# Patient Record
Sex: Female | Born: 1945 | Race: White | Hispanic: No | Smoking: Never smoker
Health system: Southern US, Community
[De-identification: ages and names within clinical notes are randomized; demographics above are authoritative.]

## PROBLEM LIST (undated history)

## (undated) DIAGNOSIS — M545 Low back pain, unspecified: Secondary | ICD-10-CM

## (undated) DIAGNOSIS — E7800 Pure hypercholesterolemia, unspecified: Secondary | ICD-10-CM

## (undated) DIAGNOSIS — M549 Dorsalgia, unspecified: Secondary | ICD-10-CM

## (undated) DIAGNOSIS — Z79891 Long term (current) use of opiate analgesic: Secondary | ICD-10-CM

## (undated) DIAGNOSIS — G8929 Other chronic pain: Secondary | ICD-10-CM

## (undated) DIAGNOSIS — K2210 Ulcer of esophagus without bleeding: Secondary | ICD-10-CM

## (undated) DIAGNOSIS — K5790 Diverticulosis of intestine, part unspecified, without perforation or abscess without bleeding: Secondary | ICD-10-CM

## (undated) DIAGNOSIS — M797 Fibromyalgia: Secondary | ICD-10-CM

## (undated) DIAGNOSIS — M1990 Unspecified osteoarthritis, unspecified site: Secondary | ICD-10-CM

## (undated) DIAGNOSIS — Z8719 Personal history of other diseases of the digestive system: Secondary | ICD-10-CM

## (undated) DIAGNOSIS — K219 Gastro-esophageal reflux disease without esophagitis: Secondary | ICD-10-CM

## (undated) DIAGNOSIS — R55 Syncope and collapse: Secondary | ICD-10-CM

## (undated) DIAGNOSIS — G5600 Carpal tunnel syndrome, unspecified upper limb: Secondary | ICD-10-CM

## (undated) DIAGNOSIS — Z8711 Personal history of peptic ulcer disease: Secondary | ICD-10-CM

## (undated) DIAGNOSIS — F32A Depression, unspecified: Secondary | ICD-10-CM

## (undated) DIAGNOSIS — F329 Major depressive disorder, single episode, unspecified: Secondary | ICD-10-CM

## (undated) DIAGNOSIS — K589 Irritable bowel syndrome without diarrhea: Secondary | ICD-10-CM

## (undated) DIAGNOSIS — I10 Essential (primary) hypertension: Secondary | ICD-10-CM

## (undated) DIAGNOSIS — R296 Repeated falls: Secondary | ICD-10-CM

## (undated) DIAGNOSIS — G629 Polyneuropathy, unspecified: Secondary | ICD-10-CM

## (undated) DIAGNOSIS — D649 Anemia, unspecified: Secondary | ICD-10-CM

## (undated) HISTORY — PX: LAPAROSCOPIC CHOLECYSTECTOMY: SUR755

## (undated) HISTORY — PX: BUNIONECTOMY: SHX129

## (undated) HISTORY — PX: BACK SURGERY: SHX140

## (undated) HISTORY — PX: SPINAL CORD STIMULATOR IMPLANT: SHX2422

## (undated) HISTORY — PX: JOINT REPLACEMENT: SHX530

## (undated) HISTORY — PX: BLADDER SUSPENSION: SHX72

---
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
## 1969-05-21 HISTORY — PX: ABDOMINAL HYSTERECTOMY: SHX81

## 1999-05-23 ENCOUNTER — Encounter: Payer: Self-pay | Admitting: Emergency Medicine

## 1999-05-23 ENCOUNTER — Emergency Department (HOSPITAL_COMMUNITY): Admission: EM | Admit: 1999-05-23 | Discharge: 1999-05-23 | Payer: Self-pay | Admitting: Emergency Medicine

## 2004-07-21 ENCOUNTER — Ambulatory Visit: Payer: Self-pay | Admitting: Family Medicine

## 2005-09-20 HISTORY — PX: LUMBAR FUSION: SHX111

## 2005-10-19 ENCOUNTER — Ambulatory Visit: Payer: Self-pay | Admitting: Family Medicine

## 2005-11-04 ENCOUNTER — Inpatient Hospital Stay (HOSPITAL_COMMUNITY): Admission: AD | Admit: 2005-11-04 | Discharge: 2005-11-08 | Payer: Self-pay | Admitting: Neurosurgery

## 2006-04-29 ENCOUNTER — Inpatient Hospital Stay: Payer: Self-pay | Admitting: Internal Medicine

## 2006-04-29 ENCOUNTER — Other Ambulatory Visit: Payer: Self-pay

## 2006-11-24 ENCOUNTER — Ambulatory Visit: Payer: Self-pay | Admitting: Gastroenterology

## 2006-11-28 IMAGING — CR DG CHEST 2V
2 series · 2 of 2 positions shown · non-contrast
Comparison: None.

CLINICAL DATA: Pre-op back surgery.
 CHEST - 2 VIEW:

[view not recorded (1 of 2)]
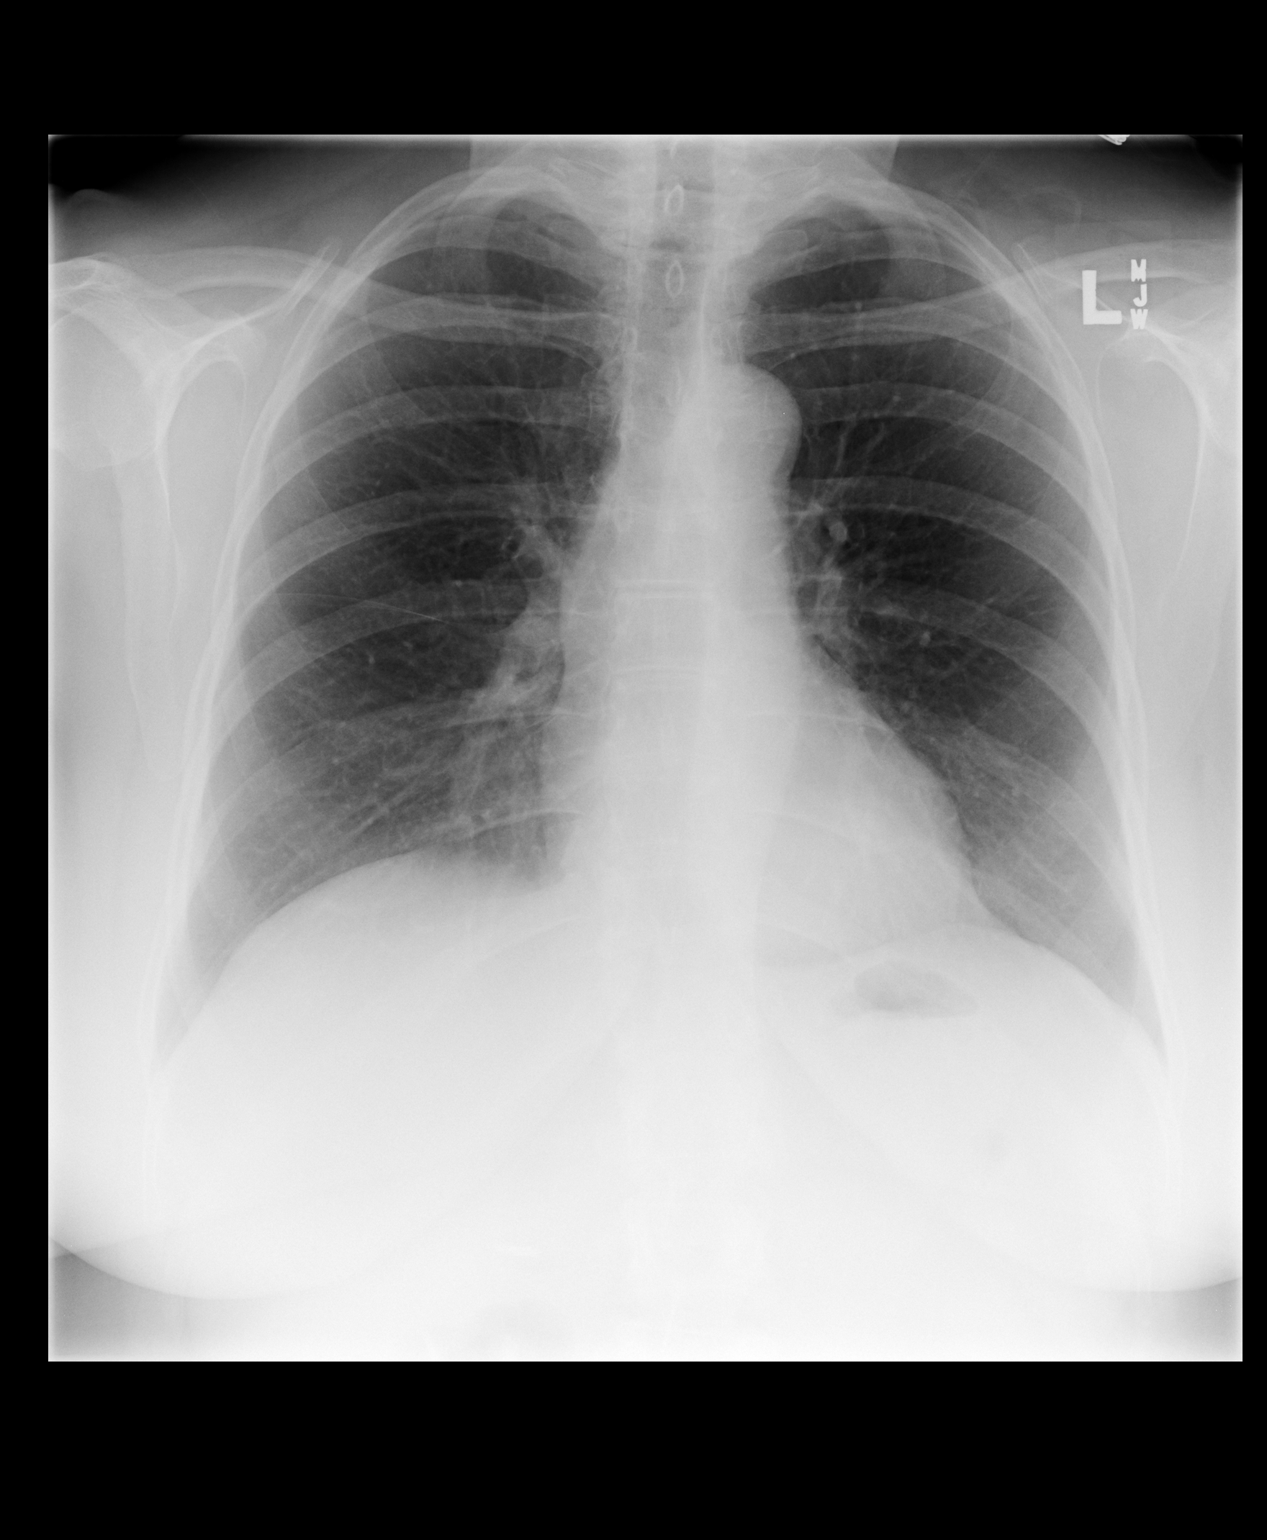

[view not recorded (2 of 2)]
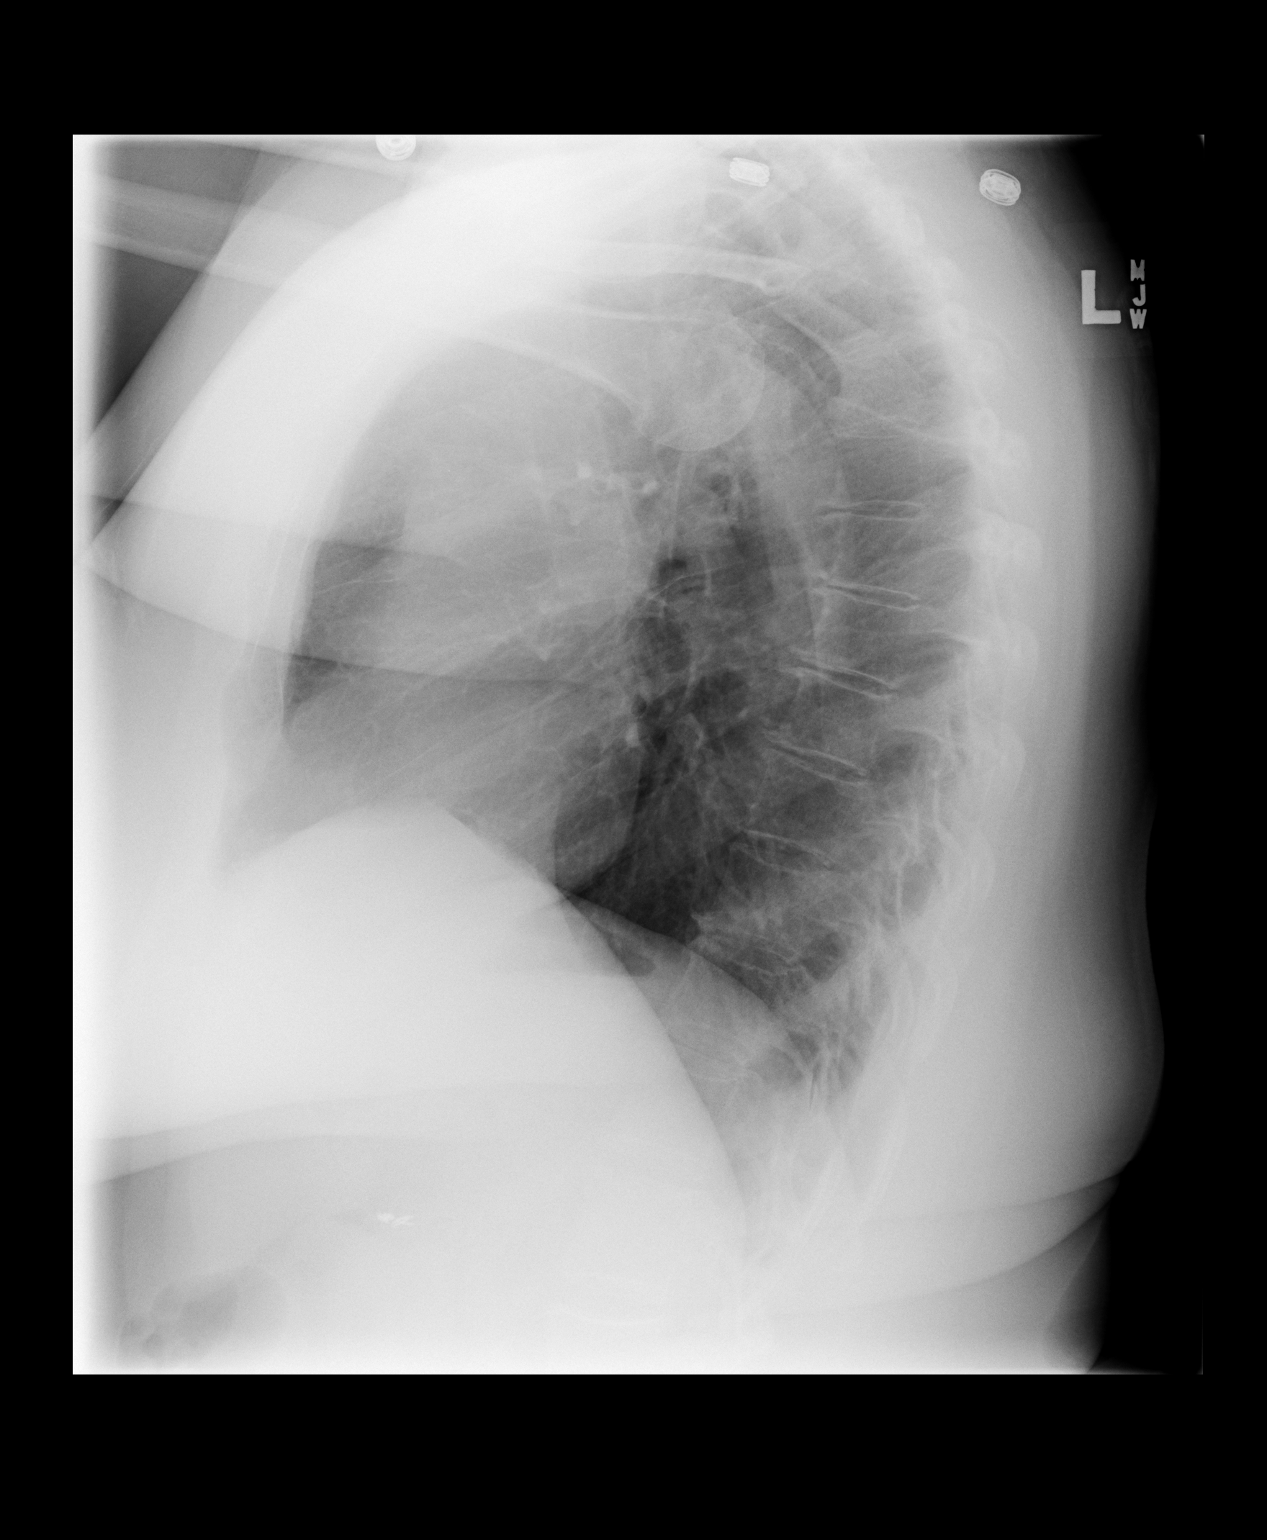

[2 of 2 positions shown; findings below may reference images not displayed]

FINDINGS: The heart size and mediastinal contours are within normal limits.  Both lungs are clear.  The visualized skeletal structures are unremarkable.
IMPRESSION: No active cardiopulmonary disease.

## 2007-06-26 ENCOUNTER — Other Ambulatory Visit: Payer: Self-pay

## 2007-06-26 ENCOUNTER — Ambulatory Visit: Payer: Self-pay | Admitting: Urology

## 2008-01-25 ENCOUNTER — Ambulatory Visit: Payer: Self-pay | Admitting: Physician Assistant

## 2009-02-12 ENCOUNTER — Ambulatory Visit: Payer: Self-pay | Admitting: Physician Assistant

## 2009-02-17 ENCOUNTER — Ambulatory Visit: Payer: Self-pay | Admitting: Physician Assistant

## 2010-05-18 ENCOUNTER — Ambulatory Visit: Payer: Self-pay | Admitting: Specialist

## 2010-05-28 ENCOUNTER — Inpatient Hospital Stay: Payer: Self-pay | Admitting: Specialist

## 2010-09-20 HISTORY — PX: TOTAL KNEE ARTHROPLASTY: SHX125

## 2013-02-08 ENCOUNTER — Inpatient Hospital Stay (HOSPITAL_COMMUNITY)
Admission: EM | Admit: 2013-02-08 | Discharge: 2013-02-10 | DRG: 392 | Disposition: A | Discharge status: Home / Self Care | Payer: Medicare Other | Attending: Internal Medicine | Admitting: Internal Medicine

## 2013-02-08 ENCOUNTER — Encounter (HOSPITAL_COMMUNITY): Payer: Self-pay | Admitting: Internal Medicine

## 2013-02-08 DIAGNOSIS — Z96659 Presence of unspecified artificial knee joint: Secondary | ICD-10-CM

## 2013-02-08 DIAGNOSIS — K5289 Other specified noninfective gastroenteritis and colitis: Principal | ICD-10-CM | POA: Diagnosis present

## 2013-02-08 DIAGNOSIS — R197 Diarrhea, unspecified: Secondary | ICD-10-CM | POA: Diagnosis present

## 2013-02-08 DIAGNOSIS — Z7982 Long term (current) use of aspirin: Secondary | ICD-10-CM

## 2013-02-08 DIAGNOSIS — I498 Other specified cardiac arrhythmias: Secondary | ICD-10-CM | POA: Diagnosis present

## 2013-02-08 DIAGNOSIS — R112 Nausea with vomiting, unspecified: Secondary | ICD-10-CM | POA: Diagnosis present

## 2013-02-08 DIAGNOSIS — Z79899 Other long term (current) drug therapy: Secondary | ICD-10-CM

## 2013-02-08 DIAGNOSIS — I10 Essential (primary) hypertension: Secondary | ICD-10-CM | POA: Diagnosis present

## 2013-02-08 DIAGNOSIS — F329 Major depressive disorder, single episode, unspecified: Secondary | ICD-10-CM | POA: Diagnosis present

## 2013-02-08 DIAGNOSIS — F3289 Other specified depressive episodes: Secondary | ICD-10-CM | POA: Diagnosis present

## 2013-02-08 DIAGNOSIS — E876 Hypokalemia: Secondary | ICD-10-CM | POA: Diagnosis present

## 2013-02-08 HISTORY — DX: Other chronic pain: G89.29

## 2013-02-08 HISTORY — DX: Essential (primary) hypertension: I10

## 2013-02-08 HISTORY — DX: Depression, unspecified: F32.A

## 2013-02-08 HISTORY — DX: Dorsalgia, unspecified: M54.9

## 2013-02-08 HISTORY — DX: Major depressive disorder, single episode, unspecified: F32.9

## 2013-02-08 LAB — CBC WITH DIFFERENTIAL/PLATELET
Basophils Absolute: 0 10*3/uL (ref 0.0–0.1)
Basophils Relative: 0 % (ref 0–1)
Hemoglobin: 11.3 g/dL — ABNORMAL LOW (ref 12.0–15.0)
MCHC: 33.5 g/dL (ref 30.0–36.0)
Neutro Abs: 8.2 10*3/uL — ABNORMAL HIGH (ref 1.7–7.7)
Neutrophils Relative %: 86 % — ABNORMAL HIGH (ref 43–77)
Platelets: 231 10*3/uL (ref 150–400)
RDW: 12.5 % (ref 11.5–15.5)

## 2013-02-08 LAB — URINALYSIS, ROUTINE W REFLEX MICROSCOPIC
Bilirubin Urine: NEGATIVE
Glucose, UA: NEGATIVE mg/dL
Ketones, ur: 15 mg/dL — AB
Specific Gravity, Urine: 1.015 (ref 1.005–1.030)
pH: 6.5 (ref 5.0–8.0)

## 2013-02-08 LAB — COMPREHENSIVE METABOLIC PANEL
AST: 28 U/L (ref 0–37)
Albumin: 3.8 g/dL (ref 3.5–5.2)
Alkaline Phosphatase: 81 U/L (ref 39–117)
Chloride: 99 mEq/L (ref 96–112)
Potassium: 2.4 mEq/L — CL (ref 3.5–5.1)
Sodium: 141 mEq/L (ref 135–145)
Total Bilirubin: 0.4 mg/dL (ref 0.3–1.2)

## 2013-02-08 LAB — URINE MICROSCOPIC-ADD ON

## 2013-02-08 MED ORDER — POTASSIUM CHLORIDE 10 MEQ/100ML IV SOLN
10.0000 meq | INTRAVENOUS | Status: AC
  Administered 2013-02-08 – 2013-02-09 (×3): 10 meq via INTRAVENOUS
  Filled 2013-02-08 (×2): qty 100

## 2013-02-08 MED ORDER — ONDANSETRON HCL 4 MG/2ML IJ SOLN
4.0000 mg | Freq: Once | INTRAMUSCULAR | Status: AC
Start: 2013-02-08 — End: 2013-02-08
  Administered 2013-02-08: 4 mg via INTRAVENOUS
  Filled 2013-02-08: qty 2

## 2013-02-08 MED ORDER — ONDANSETRON HCL 4 MG/2ML IJ SOLN
4.0000 mg | Freq: Once | INTRAMUSCULAR | Status: AC
  Administered 2013-02-08: 4 mg via INTRAVENOUS
  Filled 2013-02-08: qty 2

## 2013-02-08 MED ORDER — SODIUM CHLORIDE 0.9 % IV SOLN
Freq: Once | INTRAVENOUS | Status: AC
  Administered 2013-02-08: 21:00:00 via INTRAVENOUS

## 2013-02-08 MED ORDER — SODIUM CHLORIDE 0.9 % IV BOLUS (SEPSIS)
1000.0000 mL | Freq: Once | INTRAVENOUS | Status: AC
  Administered 2013-02-08: 1000 mL via INTRAVENOUS

## 2013-02-08 MED ORDER — SODIUM CHLORIDE 0.9 % IV SOLN: INTRAVENOUS | Status: DC

## 2013-02-08 MED ORDER — ONDANSETRON HCL 4 MG/2ML IJ SOLN: 4.0000 mg | Freq: Three times a day (TID) | INTRAMUSCULAR | Status: DC | PRN

## 2013-02-08 MED ORDER — HYDROMORPHONE HCL PF 1 MG/ML IJ SOLN: 1.0000 mg | INTRAMUSCULAR | Status: DC | PRN

## 2013-02-08 MED ORDER — POTASSIUM CHLORIDE CRYS ER 20 MEQ PO TBCR
40.0000 meq | EXTENDED_RELEASE_TABLET | Freq: Once | ORAL | Status: AC
  Administered 2013-02-08: 40 meq via ORAL
  Filled 2013-02-08: qty 2

## 2013-02-08 MED ORDER — POTASSIUM CHLORIDE 10 MEQ/100ML IV SOLN
10.0000 meq | Freq: Once | INTRAVENOUS | Status: AC
  Administered 2013-02-08: 10 meq via INTRAVENOUS
  Filled 2013-02-08: qty 100

## 2013-02-08 MED ORDER — MORPHINE SULFATE 4 MG/ML IJ SOLN
4.0000 mg | Freq: Once | INTRAMUSCULAR | Status: AC
  Administered 2013-02-08: 4 mg via INTRAVENOUS
  Filled 2013-02-08: qty 1

## 2013-02-08 NOTE — ED Notes (Signed)
Admitting MD at bedside.

## 2013-02-08 NOTE — ED Provider Notes (Signed)
Patient reports she had colitis in the past and had severe abdominal pain with it. She states she started not feeling well yesterday and she started having vomiting and diarrhea about 2:00 this morning. She states she feels weak she denies abdominal pain. She denies being around anybody else is sick. She states she's not following the diet she is supposed to but states now she will.  Patient's abdomen is soft and nontender to palpation.  Medical screening examination/treatment/procedure(s) were conducted as a shared visit with non-physician practitioner(s) and myself.  I personally evaluated the patient during the encounter  Devoria Albe, MD, Franz Dell, MD 02/08/13 2014

## 2013-02-08 NOTE — ED Provider Notes (Signed)
See prior note   Ward Givens, MD 02/08/13 2254

## 2013-02-08 NOTE — ED Notes (Signed)
Pt tolerated PO medications. Pt denies n/v.

## 2013-02-08 NOTE — ED Notes (Signed)
Critical Potassium 2.4. PA notified

## 2013-02-08 NOTE — ED Provider Notes (Signed)
History     CSN: [id number]  Arrival date & time 02/08/13  4401   First MD Initiated Contact with Patient 02/08/13 1825      Chief Complaint  Patient presents with  . Nausea  . Emesis  . Diarrhea    (Consider location/radiation/quality/duration/timing/severity/associated sxs/prior treatment) HPI  A generally healthy 67 year old female with history of hypertension presents complaining of nausea, vomiting, and diarrhea. Patient reports she was experiencing nausea throughout yesterday but since this morning she has been having persistent nonbilious, nonbloody vomits, and persistence nonbloody non-mucousy diarrhea. She endorses chills, body aches, and decrease in appetite. She tries to drinking ginger ale but unable to keep down. She denies any significant pain, just body aches. Patient denies fever, sore throat, chest pain, shortness of breath, back pain, abdominal pain, dysuria, hematochezia, melena, or rash. No recent travel, no abdominal surgery. Had colitis 5 years ago. No history of ulcerative colitis, or Crohn's disease. No recent sick contact.  No past medical history on file.  No past surgical history on file.  No family history on file.  History  Substance Use Topics  . Smoking status: Not on file  . Smokeless tobacco: Not on file  . Alcohol Use: Not on file    OB History   No data available      Review of Systems  Constitutional:       10 Systems reviewed and all are negative for acute change except as noted in the HPI.     Allergies  Review of patient's allergies indicates not on file.  Home Medications  No current outpatient prescriptions on file.  BP 164/78  Temp(Src) 99.3 F (37.4 C) (Oral)  Resp 22  SpO2 100%  Physical Exam  Nursing note and vitals reviewed. Constitutional: She is oriented to person, place, and time. She appears well-developed and well-nourished. No distress.  Awake, alert, nontoxic appearance  HENT:  Head: Atraumatic.   Mouth/Throat: Oropharynx is clear and moist.  Eyes: Conjunctivae are normal. Right eye exhibits no discharge. Left eye exhibits no discharge.  Neck: Neck supple.  Cardiovascular: Normal rate, regular rhythm and intact distal pulses.   Pulmonary/Chest: Effort normal. No respiratory distress. She exhibits no tenderness.  Abdominal: Soft. Bowel sounds are normal. There is no tenderness. There is no rebound.  No Murphy sign  No McBurney's point  No hernia  Genitourinary:  No CVA tenderness  Musculoskeletal: She exhibits no edema and no tenderness.  ROM appears intact, no obvious focal weakness  Neurological: She is alert and oriented to person, place, and time.  Mental status and motor strength appears intact  Skin: No rash noted.  Psychiatric: She has a normal mood and affect.    ED Course  Procedures (including critical care time)   Date: 02/08/2013  Rate: 59  Rhythm: sinus bradycardia  QRS Axis: normal  Intervals: normal  ST/T Wave abnormalities: supraventricular bigeminy, borderline T waves abnormalities with evidence of U waves  Conduction Disutrbances:none  Narrative Interpretation:   Old EKG Reviewed: none available    6:48 PM Patient with nausea, vomiting, diarrhea, body aches, and chills suggestive of viral GI.  She has stable vital signs, afebrile. She has a soft nontender abdomen.  8:54 PM Patient with a potassium of 2.4. EKG shows evidence of U waves. Patient receives supplementation he in the ED. She is able to tolerates by mouth. I have consult with [facility] hospitalist, Dr. Lafonda Mosses, who agrees to admit patient for potassium replenishment. Patient agrees with plan. We'll continue  with IVF and supplementation. Care discussed with attending who agrees with plan.  Critical care for low potassium with ECG changes requiring K+ infusion.    CRITICAL CARE Performed by: Fayrene Helper Total critical care time: 30 min. Critical care time was exclusive of separately billable  procedures and treating other patients. Critical care was necessary to treat or prevent imminent or life-threatening deterioration. Critical care was time spent personally by me on the following activities: development of treatment plan with patient and/or surrogate as well as nursing, discussions with consultants, evaluation of patient's response to treatment, examination of patient, obtaining history from patient or surrogate, ordering and performing treatments and interventions, ordering and review of laboratory studies, ordering and review of radiographic studies, pulse oximetry and re-evaluation of patient's condition.  Labs Reviewed  CBC WITH DIFFERENTIAL - Abnormal; Notable for the following:    RBC 3.71 (*)    Hemoglobin 11.3 (*)    HCT 33.7 (*)    Neutrophils Relative % 86 (*)    Neutro Abs 8.2 (*)    Lymphocytes Relative 10 (*)    All other components within normal limits  COMPREHENSIVE METABOLIC PANEL - Abnormal; Notable for the following:    Potassium 2.4 (*)    Glucose, Bld 120 (*)    GFR calc non Af Amer 88 (*)    All other components within normal limits  LIPASE, BLOOD  URINALYSIS, ROUTINE W REFLEX MICROSCOPIC  OCCULT BLOOD X 1 CARD TO LAB, STOOL   No results found.   1. Nausea & vomiting   2. Diarrhea   3. Hypokalemia       MDM  BP 164/87  Pulse 75  Temp(Src) 99.3 F (37.4 C) (Oral)  Resp 19  SpO2 96%         Fayrene Helper, PA-C 02/08/13 2247

## 2013-02-08 NOTE — ED Notes (Signed)
According to EMS, patient called because the patient had nausea yesterday afternoon,  got worse, diarrhea and chills overnight.  No specific pain, just nausea and feeling ill.  The patient tired to self treat and could not keep it down.  Unknow how many vomiting episodes or how many diarrhea episodes.  Denies traveling, or surgery.  Had Colitis five years ago.  Only medial history is hypertension.

## 2013-02-08 NOTE — H&P (Signed)
PCP:  Anna Genre at [facility]   Chief Complaint:  Nausea vomiting and diarrhea   HPI: Gina Lowe is a 67 y.o. female   has a past medical history of Hypertension; Neuropathy; and Depression.   Presented with  Since this Am patient started to have diarrhea with numerous loose stools. Then she developed nausea and vomiting. She denies being exposed to sick contacts. She have had some subjective fevers and occasional chills.  Patient started to feel week and unwell and called EMS. In ED she was found to have K of 2.4. She was started on potassium replacement. ECG was noted for possible U waves and hospitalist was called for admission.  Since she has been in ER her nausea started to improve and she now tolerating PO.  Patient have not endorsed any recent medication changes, she has been taking opana for 1 month. Did not endorse recent Antibiotic use.    Review of Systems:     Pertinent positives include: Fevers, chills, fatigue, nausea, vomiting, diarrhea,   Constitutional:  No weight loss, night sweats,  weight loss  HEENT:  No headaches, Difficulty swallowing,Tooth/dental problems,Sore throat,  No sneezing, itching, ear ache, nasal congestion, post nasal drip,  Cardio-vascular:  No chest pain, Orthopnea, PND, anasarca, dizziness, palpitations.no Bilateral lower extremity swelling  GI:  No heartburn, indigestion, abdominal pain, change in bowel habits, loss of appetite, melena, blood in stool, hematemesis Resp:  no shortness of breath at rest. No dyspnea on exertion, No excess mucus, no productive cough, No non-productive cough, No coughing up of blood.No change in color of mucus.No wheezing. Skin:  no rash or lesions. No jaundice GU:  no dysuria, change in color of urine, no urgency or frequency. No straining to urinate.  No flank pain.  Musculoskeletal:  No joint pain or no joint swelling. No decreased range of motion. No back pain.  Psych:  No change in mood or affect. No  depression or anxiety. No memory loss.  Neuro: no localizing neurological complaints, no tingling, no weakness, no double vision, no gait abnormality, no slurred speech, no confusion  Otherwise ROS are negative except for above, 10 systems were reviewed  Past Medical History: Past Medical History  Diagnosis Date  . Hypertension   . Neuropathy   . Depression    Past Surgical History  Procedure Laterality Date  . Back surgery    . Joint replacement      Left knee replacement     Medications: Prior to Admission medications   Medication Sig Start Date End Date Taking? Authorizing Provider  amLODipine (NORVASC) 10 MG tablet Take 10 mg by mouth daily.   Yes Historical Provider, MD  aspirin 81 MG chewable tablet Chew 81 mg by mouth daily.   Yes Historical Provider, MD  gabapentin (NEURONTIN) 600 MG tablet Take 600 mg by mouth 3 (three) times daily.   Yes Historical Provider, MD  losartan-hydrochlorothiazide (HYZAAR) 100-25 MG per tablet Take 1 tablet by mouth daily.   Yes Historical Provider, MD  omeprazole (PRILOSEC OTC) 20 MG tablet Take 20 mg by mouth daily.   Yes Historical Provider, MD  oxyCODONE-acetaminophen (PERCOCET) 10-325 MG per tablet Take 1 tablet by mouth 2 (two) times daily as needed for pain.   Yes Historical Provider, MD  oxymorphone (OPANA ER) 30 MG 12 hr tablet Take 30 mg by mouth every 12 (twelve) hours.   Yes Historical Provider, MD  PARoxetine (PAXIL) 20 MG tablet Take 20 mg by mouth every morning.  Yes Historical Provider, MD  temazepam (RESTORIL) 30 MG capsule Take 30 mg by mouth at bedtime.   Yes Historical Provider, MD    Allergies:  No Known Allergies  Social History:  Ambulatory with cane Lives at  Home alone   reports that she has never smoked. She does not have any smokeless tobacco history on file. She reports that she does not drink alcohol or use illicit drugs.   Family History: family history includes Heart disease in her father; Hypertension  in her mother; and Stroke in her mother.    Physical Exam: Patient Vitals for the past 24 hrs:  BP Temp Temp src Pulse Resp SpO2  02/08/13 2239 164/87 mmHg - - 75 19 96 %  02/08/13 2130 174/93 mmHg - - 58 15 99 %  02/08/13 2000 147/65 mmHg - - 72 18 94 %  02/08/13 1832 164/78 mmHg 99.3 F (37.4 C) Oral - 22 100 %    1. General:  in No Acute distress 2. Psychological: Alert and Oriented 3. Head/ENT:    Moist Mucous Membranes                          Head Non traumatic, neck supple                          Normal  Dentition 4. SKIN: normal  Skin turgor,  Skin clean Dry and intact no rash 5. Heart: slow but Regular rate and rhythm no Murmur, Rub or gallop 6. Lungs: Clear to auscultation bilaterally, no wheezes or crackles   7. Abdomen: Soft, non-tender, Non distended, bowel sounds present 8. Lower extremities: no clubbing, cyanosis, or edema 9. Neurologically Grossly intact, moving all 4 extremities equally 10. MSK: Normal range of motion  body mass index is unknown because there is no height or weight on file.   Labs on Admission:   Recent Labs  02/08/13 1946  NA 141  K 2.4*  CL 99  CO2 29  GLUCOSE 120*  BUN 19  CREATININE 0.67  CALCIUM 9.3    Recent Labs  02/08/13 1946  AST 28  ALT 14  ALKPHOS 81  BILITOT 0.4  PROT 7.1  ALBUMIN 3.8    Recent Labs  02/08/13 1946  LIPASE 16    Recent Labs  02/08/13 1946  WBC 9.5  NEUTROABS 8.2*  HGB 11.3*  HCT 33.7*  MCV 90.8  PLT 231   No results found for this basename: CKTOTAL, CKMB, CKMBINDEX, TROPONINI,  in the last 72 hours No results found for this basename: TSH, T4TOTAL, FREET3, T3FREE, THYROIDAB,  in the last 72 hours No results found for this basename: VITAMINB12, FOLATE, FERRITIN, TIBC, IRON, RETICCTPCT,  in the last 72 hours No results found for this basename: HGBA1C    CrCl is unknown because there is no height on file for the current visit. ABG No results found for this basename: phart, pco2,  po2, hco3, tco2, acidbasedef, o2sat     No results found for this basename: DDIMER     Other results:  I have pearsonaly reviewed this: ECG REPORT  Rate: 59  Rhythm: sinus bradycardia, ? U waves ST&T Change: no ischemic changes  UA no evidence of UTI   Cultures: No results found for this basename: sdes, specrequest, cult, reptstatus    Radiological Exams on Admission: No results found.  Chart has been reviewed  Assessment/Plan  67 yo F  here with likely gastroenteritis and hypokalemia  Present on Admission:  . Hypokalemia - continue replacement initiated by ED, once all KCl has been given will recheck K, add on Mg and if needed will replace, monitor on telemetry . Nausea & vomiting - now improving, advance diet . Diarrhea - evaluate for infections etiology doubt c. Diff no risk factors . Hypertension - continue home meds but hold HCTZ while getting IVF   Prophylaxis: SCD, Protonix  CODE STATUS: FULL CODE per patient   Other plan as per orders.  I have spent a total of 55 min on this admission  Malahki Gasaway 02/08/2013, 11:29 PM

## 2013-02-09 LAB — COMPREHENSIVE METABOLIC PANEL
ALT: 17 U/L (ref 0–35)
AST: 41 U/L — ABNORMAL HIGH (ref 0–37)
Albumin: 3.5 g/dL (ref 3.5–5.2)
Alkaline Phosphatase: 74 U/L (ref 39–117)
Chloride: 104 mEq/L (ref 96–112)
Potassium: 3 mEq/L — ABNORMAL LOW (ref 3.5–5.1)
Sodium: 141 mEq/L (ref 135–145)
Total Bilirubin: 0.3 mg/dL (ref 0.3–1.2)

## 2013-02-09 LAB — BASIC METABOLIC PANEL
BUN: 15 mg/dL (ref 6–23)
CO2: 28 mEq/L (ref 19–32)
Calcium: 9.5 mg/dL (ref 8.4–10.5)
Creatinine, Ser: 0.69 mg/dL (ref 0.50–1.10)
GFR calc Af Amer: >90 mL/min (ref >90)

## 2013-02-09 LAB — CBC
MCH: 30.6 pg (ref 26.0–34.0)
MCV: 92.2 fL (ref 78.0–100.0)
Platelets: 239 10*3/uL (ref 150–400)
RDW: 12.6 % (ref 11.5–15.5)

## 2013-02-09 LAB — PHOSPHORUS: Phosphorus: 1.9 mg/dL — ABNORMAL LOW (ref 2.3–4.6)

## 2013-02-09 LAB — TSH: TSH: 0.875 u[IU]/mL (ref 0.350–4.500)

## 2013-02-09 MED ORDER — OXYCODONE-ACETAMINOPHEN 10-325 MG PO TABS: 1.0000 | ORAL_TABLET | Freq: Two times a day (BID) | ORAL | Status: DC | PRN

## 2013-02-09 MED ORDER — TEMAZEPAM 15 MG PO CAPS
30.0000 mg | ORAL_CAPSULE | Freq: Every day | ORAL | Status: DC
  Administered 2013-02-09 (×2): 30 mg via ORAL
  Filled 2013-02-09 (×2): qty 2

## 2013-02-09 MED ORDER — SODIUM PHOSPHATE 3 MMOLE/ML IV SOLN: 20.0000 meq | Freq: Once | INTRAVENOUS | Status: DC

## 2013-02-09 MED ORDER — OXYMORPHONE HCL ER 10 MG PO T12A
30.0000 mg | EXTENDED_RELEASE_TABLET | Freq: Two times a day (BID) | ORAL | Status: DC
  Administered 2013-02-09 – 2013-02-10 (×4): 30 mg via ORAL
  Filled 2013-02-09 (×4): qty 3

## 2013-02-09 MED ORDER — GABAPENTIN 600 MG PO TABS
600.0000 mg | ORAL_TABLET | Freq: Three times a day (TID) | ORAL | Status: DC
  Administered 2013-02-09 – 2013-02-10 (×4): 600 mg via ORAL
  Filled 2013-02-09 (×7): qty 1

## 2013-02-09 MED ORDER — LOSARTAN POTASSIUM 100 MG PO TABS: 100.0000 mg | ORAL_TABLET | Freq: Every day | ORAL | Status: DC

## 2013-02-09 MED ORDER — OXYCODONE HCL 5 MG PO TABS
5.0000 mg | ORAL_TABLET | Freq: Two times a day (BID) | ORAL | Status: DC | PRN
  Administered 2013-02-09: 5 mg via ORAL
  Filled 2013-02-09: qty 1

## 2013-02-09 MED ORDER — ACETAMINOPHEN 325 MG PO TABS: 650.0000 mg | ORAL_TABLET | Freq: Four times a day (QID) | ORAL | Status: DC | PRN

## 2013-02-09 MED ORDER — SODIUM GLYCEROPHOSPHATE 1 MMOLE/ML IV SOLN
20.0000 mmol | Freq: Once | INTRAVENOUS | Status: AC
  Administered 2013-02-09: 20 mmol via INTRAVENOUS
  Filled 2013-02-09: qty 20

## 2013-02-09 MED ORDER — MORPHINE SULFATE 4 MG/ML IJ SOLN: 4.0000 mg | INTRAMUSCULAR | Status: DC | PRN

## 2013-02-09 MED ORDER — SODIUM CHLORIDE 0.9 % IJ SOLN
3.0000 mL | Freq: Two times a day (BID) | INTRAMUSCULAR | Status: DC
  Administered 2013-02-09 – 2013-02-10 (×2): 3 mL via INTRAVENOUS

## 2013-02-09 MED ORDER — ONDANSETRON HCL 4 MG/2ML IJ SOLN
4.0000 mg | Freq: Four times a day (QID) | INTRAMUSCULAR | Status: DC | PRN
  Administered 2013-02-09: 4 mg via INTRAVENOUS
  Filled 2013-02-09: qty 2

## 2013-02-09 MED ORDER — POTASSIUM CHLORIDE CRYS ER 20 MEQ PO TBCR
60.0000 meq | EXTENDED_RELEASE_TABLET | Freq: Once | ORAL | Status: AC
  Administered 2013-02-09: 60 meq via ORAL
  Filled 2013-02-09: qty 3

## 2013-02-09 MED ORDER — ACETAMINOPHEN 650 MG RE SUPP: 650.0000 mg | Freq: Four times a day (QID) | RECTAL | Status: DC | PRN

## 2013-02-09 MED ORDER — ASPIRIN 81 MG PO CHEW
81.0000 mg | CHEWABLE_TABLET | Freq: Every day | ORAL | Status: DC
  Administered 2013-02-09 – 2013-02-10 (×2): 81 mg via ORAL
  Filled 2013-02-09: qty 1

## 2013-02-09 MED ORDER — LOSARTAN POTASSIUM 50 MG PO TABS
100.0000 mg | ORAL_TABLET | Freq: Every day | ORAL | Status: DC
  Administered 2013-02-09 – 2013-02-10 (×2): 100 mg via ORAL
  Filled 2013-02-09 (×2): qty 2

## 2013-02-09 MED ORDER — PNEUMOCOCCAL VAC POLYVALENT 25 MCG/0.5ML IJ INJ
0.5000 mL | INJECTION | INTRAMUSCULAR | Status: AC
  Administered 2013-02-10: 0.5 mL via INTRAMUSCULAR
  Filled 2013-02-09: qty 0.5

## 2013-02-09 MED ORDER — ENOXAPARIN SODIUM 40 MG/0.4ML ~~LOC~~ SOLN
40.0000 mg | SUBCUTANEOUS | Status: DC
  Administered 2013-02-09 – 2013-02-10 (×2): 40 mg via SUBCUTANEOUS
  Filled 2013-02-09 (×3): qty 0.4

## 2013-02-09 MED ORDER — AMLODIPINE BESYLATE 10 MG PO TABS
10.0000 mg | ORAL_TABLET | Freq: Every day | ORAL | Status: DC
  Administered 2013-02-09 – 2013-02-10 (×2): 10 mg via ORAL
  Filled 2013-02-09 (×2): qty 1

## 2013-02-09 MED ORDER — PANTOPRAZOLE SODIUM 20 MG PO TBEC
20.0000 mg | DELAYED_RELEASE_TABLET | Freq: Every day | ORAL | Status: DC
  Administered 2013-02-09 – 2013-02-10 (×2): 20 mg via ORAL
  Filled 2013-02-09 (×2): qty 1

## 2013-02-09 MED ORDER — OXYCODONE-ACETAMINOPHEN 5-325 MG PO TABS
1.0000 | ORAL_TABLET | Freq: Two times a day (BID) | ORAL | Status: DC | PRN
  Administered 2013-02-09: 1 via ORAL
  Filled 2013-02-09: qty 1

## 2013-02-09 MED ORDER — POTASSIUM CHLORIDE CRYS ER 20 MEQ PO TBCR
40.0000 meq | EXTENDED_RELEASE_TABLET | Freq: Once | ORAL | Status: AC
  Administered 2013-02-09: 40 meq via ORAL
  Filled 2013-02-09 (×2): qty 2

## 2013-02-09 MED ORDER — SODIUM CHLORIDE 0.9 % IV SOLN
INTRAVENOUS | Status: AC
  Administered 2013-02-09: 01:00:00 via INTRAVENOUS

## 2013-02-09 MED ORDER — OXYMORPHONE HCL ER 30 MG PO TB12: 30.0000 mg | ORAL_TABLET | Freq: Two times a day (BID) | ORAL | Status: DC

## 2013-02-09 MED ORDER — ONDANSETRON HCL 4 MG/2ML IJ SOLN: 4.0000 mg | Freq: Four times a day (QID) | INTRAMUSCULAR | Status: AC | PRN

## 2013-02-09 MED ORDER — HYDRALAZINE HCL 20 MG/ML IJ SOLN: 10.0000 mg | INTRAMUSCULAR | Status: DC | PRN

## 2013-02-09 MED ORDER — PAROXETINE HCL 20 MG PO TABS
20.0000 mg | ORAL_TABLET | ORAL | Status: DC
  Administered 2013-02-09 – 2013-02-10 (×2): 20 mg via ORAL
  Filled 2013-02-09 (×3): qty 1

## 2013-02-09 MED ORDER — POTASSIUM CHLORIDE 10 MEQ/100ML IV SOLN
10.0000 meq | INTRAVENOUS | Status: AC
  Administered 2013-02-09 (×2): 10 meq via INTRAVENOUS
  Filled 2013-02-09 (×2): qty 100

## 2013-02-09 MED ORDER — OMEPRAZOLE MAGNESIUM 20 MG PO TBEC: 20.0000 mg | DELAYED_RELEASE_TABLET | Freq: Every day | ORAL | Status: DC

## 2013-02-09 NOTE — Progress Notes (Addendum)
[facility] PROGRESS NOTE  Gina Lowe ZOX:[id number] DOB: [birth year] DOA: 02/08/2013 PCP: No primary provider on file.  Assessment/Plan  Nausea, vomiting, and diarrhea, acute onset and no risk factors for C. Diff, however, there is a sweet odor to her stools and she has had watery stools with incontinence.  She is on PPI.   -  C. Diff  -  Stool culture -  Norovirus PCR -  Continue IVF  Hypokalemia, likely realted to diarrhea, increasing slowly with oral and IV repletion -  Potassium chloride PO and IV  -  Magnesium wnl -  Continue to hold HCTZ post discharge.  PCP may reinstate at later time if necessary  Hypophosphatemia -  Sodium phosphorus IV  HTN, blood pressure elevated while holding some BP mediations, including id -  Continue ARB -  Continue norvasc -  Continue ASA 81mg  -  Avoid beta blocker due to bradycardia -  Add hydralazine prn  Diet:  Advance to healthy heart Access:  PIV IVF:  NS, electrolytes Proph:  lovenox  Code Status: full Family Communication: spoke with patient alone Disposition Plan: If tolerating food and liquid and electrolytes are better this afternoon, may be able to go home.  Otherwise, will anticipate discharge tomorrow.   Consultants:  none  Procedures:  none  Antibiotics:  None   HPI/Subjective:  Feels much better.  Nausea improved and she feels like she may be able to eat something bland for lunch.  Still having liquidy BMs with incontinence.  Denies abdominal pain, fevers, chills, CP, SOB.    Objective: Filed Vitals:   02/08/13 2239 02/08/13 2352 02/09/13 0048 02/09/13 0610  BP: 164/87 147/63 151/79 137/62  Pulse: 75 89 57 49  Temp:   98.5 F (36.9 C) 98.2 F (36.8 C)  TempSrc:   Oral Oral  Resp: 19 19 18 18   SpO2: 96% 95% 97% 96%    Intake/Output Summary (Last 24 hours) at 02/09/13 1057 Last data filed at 02/09/13 0800  Gross per 24 hour  Intake   1960 ml  Output    201 ml  Net   1759 ml    There were no vitals filed for this visit.  Exam:   General:  CF,  No acute distress  HEENT:  NCAT, MMM  Cardiovascular:  RRR, nl S1, S2 no mrg, 2+ pulses, warm extremities  Respiratory:  CTAB, no increased WOB  Abdomen:   Hyperactive BS, soft, NT/ND  MSK:   Normal tone and bulk, no LEE  Neuro:  Grossly intact  Data Reviewed: Basic Metabolic Panel:  Recent Labs Lab 02/08/13 1946 02/09/13 0605  NA 141 141  K 2.4* 3.0*  CL 99 104  CO2 29 23  GLUCOSE 120* 104*  BUN 19 16  CREATININE 0.67 0.70  CALCIUM 9.3 9.0  MG 2.1 2.0  PHOS  --  1.9*   Liver Function Tests:  Recent Labs Lab 02/08/13 1946 02/09/13 0605  AST 28 41*  ALT 14 17  ALKPHOS 81 74  BILITOT 0.4 0.3  PROT 7.1 6.8  ALBUMIN 3.8 3.5    Recent Labs Lab 02/08/13 1946  LIPASE 16   No results found for this basename: AMMONIA,  in the last 168 hours CBC:  Recent Labs Lab 02/08/13 1946 02/09/13 0605  WBC 9.5 10.3  NEUTROABS 8.2*  --   HGB 11.3* 11.0*  HCT 33.7* 33.1*  MCV 90.8 92.2  PLT 231 239   Cardiac Enzymes: No results found for  this basename: CKTOTAL, CKMB, CKMBINDEX, TROPONINI,  in the last 168 hours BNP (last 3 results) No results found for this basename: PROBNP,  in the last 8760 hours CBG: No results found for this basename: GLUCAP,  in the last 168 hours  No results found for this or any previous visit (from the past 240 hour(s)).   Studies: No results found.  Scheduled Meds: . sodium chloride   Intravenous STAT  . amLODipine  10 mg Oral Daily  . aspirin  81 mg Oral Daily  . enoxaparin (LOVENOX) injection  40 mg Subcutaneous Q24H  . gabapentin  600 mg Oral TID  . oxymorphone  30 mg Oral Q12H  . pantoprazole  20 mg Oral Daily  . PARoxetine  20 mg Oral [id number]  . [START ON 02/10/2013] pneumococcal 23 valent vaccine  0.5 mL Intramuscular Tomorrow-1000  . sodium chloride  3 mL Intravenous Q12H  . temazepam  30 mg Oral QHS   Continuous Infusions:   Active  Problems:   Hypokalemia   Nausea & vomiting   Diarrhea   Hypertension    Time spent: 30 min    Tationna Fullard, [facility]  [facility] Pager [phone]. If 7PM-7AM, please contact night-coverage at [email or web address], password [facility] 02/09/2013, 10:57 AM  LOS: 1 day

## 2013-02-09 NOTE — Progress Notes (Signed)
Utilization review completed.  

## 2013-02-09 NOTE — Discharge Summary (Addendum)
Physician Discharge Summary  Gina Lowe UEA:[id number] DOB: 1945/10/29 DOA: 02/08/2013  PCP: No primary provider on file.  Admit date: 02/08/2013 Discharge date: 02/10/2013  Recommendations for Outpatient Follow-up:  1. Primary care doctor in 1 week:  Repeat BMP, magnesium, phosphorus.  Check blood pressure.    Discharge Diagnoses:  Active Problems:   Hypokalemia   Nausea & vomiting   Diarrhea   Hypertension   Hypophosphatemia   Discharge Condition: stable, improved  Diet recommendation: regular for now  Wt Readings from Last 3 Encounters:  No data found for Wt    History of present illness:   Gina Lowe is a 67 y.o. female  has a past medical history of Hypertension; Neuropathy; and Depression.  Presented with  Since this Am patient started to have diarrhea with numerous loose stools. Then she developed nausea and vomiting. She denies being exposed to sick contacts. She have had some subjective fevers and occasional chills.  Patient started to feel week and unwell and called EMS. In ED she was found to have K of 2.4. She was started on potassium replacement. ECG was noted for possible U waves and hospitalist was called for admission.  Since she has been in ER her nausea started to improve and she now tolerating PO.  Patient have not endorsed any recent medication changes, she has been taking opana for 1 month. Did not endorse recent Antibiotic use.    Hospital Course:   Nausea, vomiting, and diarrhea, acute onset and no risk factors for C. Diff.  Her vomiting and diarrhea lessened so stool samples could not be collected.  She was given IV fluids and supplemental electrolytes.  She was able to eat and drink well prior to discharge.    Hypokalemia, likely related to diarrhea, increased gradually with oral and IV repletion.  Magnesium was within normal limits.  Phosphorus was low and she was given IV repletion.  Her HCTZ was discontinued.  Electrolytes and BP should  be rechecked in about one week.    HTN, blood pressure elevated while holding some BP mediations, but returned to normal with ARB, norvasc.  She had asymptomatic sinus bradycardia on telemetry so would avoid AV nodal medications for blood pressure.  Contiued ASA 81mg  daily.  Advised her to speak to her primary care doctor if she develops syncope, lightheadedness, or palpitations.     Consultants:  none Procedures:  none Antibiotics:  None    Discharge Exam: Filed Vitals:   02/10/13 0512  BP: 149/87  Pulse: 74  Temp: 97.7 F (36.5 C)  Resp: 18   Filed Vitals:   02/09/13 0610 02/09/13 1400 02/09/13 1955 02/10/13 0512  BP: 137/62 118/76 149/88 149/87  Pulse: 49 56 60 74  Temp: 98.2 F (36.8 C) 98.2 F (36.8 C) 98.6 F (37 C) 97.7 F (36.5 C)  TempSrc: Oral  Oral Oral  Resp: 18 17 18 18   SpO2: 96% 92% 93% 94%   Felt more ill last night and had some nausea and diarrhea, but feels well this morning.  No diarrhea this morning.  Eating better.  She is asking to go home.    General: CF, No acute distress, smiling, lying in bed. HEENT: NCAT, MMM  Cardiovascular: RRR, nl S1, S2 no mrg, 2+ pulses, warm extremities  Respiratory: CTAB, no increased WOB  Abdomen: NABS, soft, NT/ND  MSK: Normal tone and bulk, no LEE  Neuro: Grossly intact   Discharge Instructions      Discharge Orders  Future Orders Complete By Expires     Call MD for:  difficulty breathing, headache or visual disturbances  As directed     Call MD for:  extreme fatigue  As directed     Call MD for:  hives  As directed     Call MD for:  persistant dizziness or light-headedness  As directed     Call MD for:  persistant nausea and vomiting  As directed     Call MD for:  severe uncontrolled pain  As directed     Call MD for:  temperature >100.4  As directed     Diet general  As directed     Discharge instructions  As directed     Comments:      You were hospitalized with dehydration, vomiting, and diarrhea.   You most likely had a stomach virus.  Please drink plenty of fluids.  You may eat salty foods for the next few days.  Your potassium was very low.  Please make sure you have your blood work rechecked by your primary care doctor within one week.  Please see the list of high potassium foods.  Your blood pressure medication HCTZ (in your combination pill) can worsen low potassium and dehydration.  I will give you a prescription for the other medication in that pill (losartan) to take until you see your primary care doctor.  If your blood pressure is elevated and your potassium is normal, you may be able to resume your previous blood pressure medication then.    Increase activity slowly  As directed         Medication List    STOP taking these medications       losartan-hydrochlorothiazide 100-25 MG per tablet  Commonly known as:  HYZAAR      TAKE these medications       amLODipine 10 MG tablet  Commonly known as:  NORVASC  Take 10 mg by mouth daily.     aspirin 81 MG chewable tablet  Chew 81 mg by mouth daily.     gabapentin 600 MG tablet  Commonly known as:  NEURONTIN  Take 600 mg by mouth 3 (three) times daily.     losartan 100 MG tablet  Commonly known as:  COZAAR  Take 1 tablet (100 mg total) by mouth daily.     ondansetron 4 MG disintegrating tablet  Commonly known as:  ZOFRAN ODT  Take 1 tablet (4 mg total) by mouth every 8 (eight) hours as needed for nausea.     oxyCODONE-acetaminophen 10-325 MG per tablet  Commonly known as:  PERCOCET  Take 1 tablet by mouth 2 (two) times daily as needed for pain.     oxymorphone 30 MG 12 hr tablet  Commonly known as:  OPANA ER  Take 30 mg by mouth every 12 (twelve) hours.     PARoxetine 20 MG tablet  Commonly known as:  PAXIL  Take 20 mg by mouth every morning.     PRILOSEC OTC 20 MG tablet  Generic drug:  omeprazole  Take 20 mg by mouth daily.     temazepam 30 MG capsule  Commonly known as:  RESTORIL  Take 30 mg by mouth at  bedtime.       Follow-up Information   Follow up with primary care doctor . Schedule an appointment as soon as possible for a visit in 1 week. (repeat labs)        The results of significant diagnostics  from this hospitalization (including imaging, microbiology, ancillary and laboratory) are listed below for reference.    Significant Diagnostic Studies: No results found.  Microbiology: No results found for this or any previous visit (from the past 240 hour(s)).   Labs: Basic Metabolic Panel:  Recent Labs Lab 02/08/13 1946 02/09/13 0605 02/09/13 1232  NA 141 141 142  K 2.4* 3.0* 3.4*  CL 99 104 101  CO2 29 23 28   GLUCOSE 120* 104* 99  BUN 19 16 15   CREATININE 0.67 0.70 0.69  CALCIUM 9.3 9.0 9.5  MG 2.1 2.0  --   PHOS  --  1.9*  --    Liver Function Tests:  Recent Labs Lab 02/08/13 1946 02/09/13 0605  AST 28 41*  ALT 14 17  ALKPHOS 81 74  BILITOT 0.4 0.3  PROT 7.1 6.8  ALBUMIN 3.8 3.5    Recent Labs Lab 02/08/13 1946  LIPASE 16   No results found for this basename: AMMONIA,  in the last 168 hours CBC:  Recent Labs Lab 02/08/13 1946 02/09/13 0605  WBC 9.5 10.3  NEUTROABS 8.2*  --   HGB 11.3* 11.0*  HCT 33.7* 33.1*  MCV 90.8 92.2  PLT 231 239   Cardiac Enzymes: No results found for this basename: CKTOTAL, CKMB, CKMBINDEX, TROPONINI,  in the last 168 hours BNP: BNP (last 3 results) No results found for this basename: PROBNP,  in the last 8760 hours CBG: No results found for this basename: GLUCAP,  in the last 168 hours  Time coordinating discharge: 35 minutes  Signed:  Alexi Geibel  [facility] 02/10/2013, 9:43 AM

## 2013-02-09 NOTE — ED Notes (Signed)
Pt home meds counted in front of pt. Verified by pharmacy. Medications locked in pharmacy. Copy of medication verification in physical chart.

## 2013-02-10 DIAGNOSIS — I10 Essential (primary) hypertension: Secondary | ICD-10-CM

## 2013-02-10 DIAGNOSIS — R112 Nausea with vomiting, unspecified: Secondary | ICD-10-CM

## 2013-02-10 DIAGNOSIS — E876 Hypokalemia: Secondary | ICD-10-CM

## 2013-02-10 DIAGNOSIS — R197 Diarrhea, unspecified: Secondary | ICD-10-CM

## 2013-02-10 MED ORDER — ONDANSETRON 4 MG PO TBDP: 4.0000 mg | ORAL_TABLET | Freq: Three times a day (TID) | ORAL | Status: DC | PRN

## 2013-02-10 NOTE — Progress Notes (Signed)
Pt was scheduled for a late discharge home per orders, but pt refused. She stated "nausea and diarrhea are worse than earlier today." Had discussion with pt about her diagnosis and symptoms management but pt did not feel was still hesitant to go home because her symptoms had not fully resolved. Lynch,NP was notified and pt will stay the night. Dr. Malachi Bonds to be notified in the am per Burnadette Peter, NP.

## 2014-07-22 ENCOUNTER — Ambulatory Visit: Payer: Self-pay | Admitting: Specialist

## 2014-07-22 DIAGNOSIS — I10 Essential (primary) hypertension: Secondary | ICD-10-CM

## 2014-07-22 LAB — CBC WITH DIFFERENTIAL/PLATELET
BASOS ABS: 0 10*3/uL (ref 0.0–0.1)
Basophil %: 0.5 %
EOS PCT: 0.6 %
Eosinophil #: 0.1 10*3/uL (ref 0.0–0.7)
HCT: 34.5 % — ABNORMAL LOW (ref 35.0–47.0)
HGB: 11.1 g/dL — AB (ref 12.0–16.0)
LYMPHS PCT: 10.9 %
Lymphocyte #: 1.1 10*3/uL (ref 1.0–3.6)
MCH: 29.6 pg (ref 26.0–34.0)
MCHC: 32.2 g/dL (ref 32.0–36.0)
MCV: 92 fL (ref 80–100)
Monocyte #: 0.5 x10 3/mm (ref 0.2–0.9)
Monocyte %: 4.6 %
NEUTROS ABS: 8.4 10*3/uL — AB (ref 1.4–6.5)
Neutrophil %: 83.4 %
PLATELETS: 324 10*3/uL (ref 150–440)
RBC: 3.75 10*6/uL — AB (ref 3.80–5.20)
RDW: 13.9 % (ref 11.5–14.5)
WBC: 10 10*3/uL (ref 3.6–11.0)

## 2014-07-22 LAB — BASIC METABOLIC PANEL
ANION GAP: 8 (ref 7–16)
BUN: 14 mg/dL (ref 7–18)
CHLORIDE: 93 mmol/L — AB (ref 98–107)
CO2: 35 mmol/L — AB (ref 21–32)
CREATININE: 0.94 mg/dL (ref 0.60–1.30)
Calcium, Total: 8.8 mg/dL (ref 8.5–10.1)
EGFR (Non-African Amer.): >60
Glucose: 90 mg/dL (ref 65–99)
Osmolality: 272 (ref 275–301)
POTASSIUM: 2.3 mmol/L — AB (ref 3.5–5.1)
SODIUM: 136 mmol/L (ref 136–145)

## 2014-07-22 LAB — URINALYSIS, COMPLETE
Bilirubin,UR: NEGATIVE
Blood: NEGATIVE
GLUCOSE, UR: NEGATIVE mg/dL (ref 0–75)
Nitrite: POSITIVE
Ph: 5 (ref 4.5–8.0)
RBC,UR: 12 /HPF (ref 0–5)
Specific Gravity: 1.027 (ref 1.003–1.030)
Transitional Epi: 3
WBC UR: 247 /HPF (ref 0–5)

## 2014-07-22 LAB — PROTIME-INR
INR: 1
Prothrombin Time: 13.1 secs (ref 11.5–14.7)

## 2014-07-22 LAB — MRSA PCR SCREENING

## 2014-07-22 LAB — APTT

## 2014-07-30 ENCOUNTER — Inpatient Hospital Stay: Payer: Self-pay | Admitting: Specialist

## 2014-07-30 LAB — CREATININE, SERUM
Creatinine: 1.01 mg/dL (ref 0.60–1.30)
EGFR (African American): >60
EGFR (Non-African Amer.): 58 — ABNORMAL LOW

## 2014-07-31 LAB — BASIC METABOLIC PANEL
Anion Gap: 9 (ref 7–16)
BUN: 11 mg/dL (ref 7–18)
Calcium, Total: 8.3 mg/dL — ABNORMAL LOW (ref 8.5–10.1)
Chloride: 97 mmol/L — ABNORMAL LOW (ref 98–107)
Co2: 29 mmol/L (ref 21–32)
Creatinine: 1.02 mg/dL (ref 0.60–1.30)
EGFR (African American): >60
GFR CALC NON AF AMER: 57 — AB
Glucose: 166 mg/dL — ABNORMAL HIGH (ref 65–99)
Osmolality: 273 (ref 275–301)
POTASSIUM: 3.4 mmol/L — AB (ref 3.5–5.1)
Sodium: 135 mmol/L — ABNORMAL LOW (ref 136–145)

## 2014-07-31 LAB — HEMOGLOBIN
HGB: 10.8 g/dL — AB (ref 12.0–16.0)
HGB: 9.9 g/dL — ABNORMAL LOW (ref 12.0–16.0)

## 2014-08-02 LAB — CBC WITH DIFFERENTIAL/PLATELET
BASOS PCT: 0.2 %
Basophil #: 0 10*3/uL (ref 0.0–0.1)
EOS ABS: 0 10*3/uL (ref 0.0–0.7)
Eosinophil %: 0.3 %
HCT: 26.5 % — ABNORMAL LOW (ref 35.0–47.0)
HGB: 8.5 g/dL — ABNORMAL LOW (ref 12.0–16.0)
Lymphocyte #: 0.2 10*3/uL — ABNORMAL LOW (ref 1.0–3.6)
Lymphocyte %: 2.4 %
MCH: 29.3 pg (ref 26.0–34.0)
MCHC: 32.1 g/dL (ref 32.0–36.0)
MCV: 91 fL (ref 80–100)
MONOS PCT: 3.8 %
Monocyte #: 0.3 x10 3/mm (ref 0.2–0.9)
NEUTROS ABS: 8 10*3/uL — AB (ref 1.4–6.5)
NEUTROS PCT: 93.3 %
Platelet: 298 10*3/uL (ref 150–440)
RBC: 2.9 10*6/uL — ABNORMAL LOW (ref 3.80–5.20)
RDW: 14.4 % (ref 11.5–14.5)
WBC: 8.6 10*3/uL (ref 3.6–11.0)

## 2014-08-03 LAB — BASIC METABOLIC PANEL
ANION GAP: 5 — AB (ref 7–16)
BUN: 24 mg/dL — AB (ref 7–18)
CALCIUM: 7.8 mg/dL — AB (ref 8.5–10.1)
CO2: 25 mmol/L (ref 21–32)
CREATININE: 1.04 mg/dL (ref 0.60–1.30)
Chloride: 111 mmol/L — ABNORMAL HIGH (ref 98–107)
EGFR (African American): >60
EGFR (Non-African Amer.): 56 — ABNORMAL LOW
GLUCOSE: 136 mg/dL — AB (ref 65–99)
Osmolality: 287 (ref 275–301)
POTASSIUM: 5.8 mmol/L — AB (ref 3.5–5.1)
Sodium: 141 mmol/L (ref 136–145)

## 2014-08-03 LAB — HEMOGLOBIN: HGB: 7.1 g/dL — AB (ref 12.0–16.0)

## 2014-08-04 LAB — BASIC METABOLIC PANEL
Anion Gap: 7 (ref 7–16)
BUN: 27 mg/dL — AB (ref 7–18)
CALCIUM: 8.3 mg/dL — AB (ref 8.5–10.1)
CHLORIDE: 108 mmol/L — AB (ref 98–107)
CO2: 25 mmol/L (ref 21–32)
Creatinine: 0.88 mg/dL (ref 0.60–1.30)
EGFR (African American): >60
Glucose: 138 mg/dL — ABNORMAL HIGH (ref 65–99)
Osmolality: 287 (ref 275–301)
POTASSIUM: 5.2 mmol/L — AB (ref 3.5–5.1)
SODIUM: 140 mmol/L (ref 136–145)

## 2014-08-04 LAB — HEMOGLOBIN: HGB: 8.6 g/dL — ABNORMAL LOW (ref 12.0–16.0)

## 2014-08-04 LAB — POTASSIUM: POTASSIUM: 4.5 mmol/L (ref 3.5–5.1)

## 2014-08-04 LAB — WOUND CULTURE

## 2014-08-05 LAB — BASIC METABOLIC PANEL
ANION GAP: 7 (ref 7–16)
BUN: 24 mg/dL — ABNORMAL HIGH (ref 7–18)
Calcium, Total: 8.5 mg/dL (ref 8.5–10.1)
Chloride: 106 mmol/L (ref 98–107)
Co2: 29 mmol/L (ref 21–32)
Creatinine: 0.76 mg/dL (ref 0.60–1.30)
EGFR (African American): >60
Glucose: 103 mg/dL — ABNORMAL HIGH (ref 65–99)
OSMOLALITY: 287 (ref 275–301)
POTASSIUM: 3.3 mmol/L — AB (ref 3.5–5.1)
Sodium: 142 mmol/L (ref 136–145)

## 2014-08-05 LAB — HEMOGLOBIN A1C: HEMOGLOBIN A1C: 5.3 % (ref 4.2–6.3)

## 2014-08-05 LAB — HEMOGLOBIN: HGB: 8.7 g/dL — ABNORMAL LOW (ref 12.0–16.0)

## 2014-08-20 HISTORY — PX: TOTAL KNEE ARTHROPLASTY: SHX125

## 2014-09-09 ENCOUNTER — Inpatient Hospital Stay: Payer: Self-pay | Admitting: Internal Medicine

## 2014-09-09 LAB — URINALYSIS, COMPLETE
Bacteria: NONE SEEN
Bilirubin,UR: NEGATIVE
Blood: NEGATIVE
Glucose,UR: NEGATIVE mg/dL (ref 0–75)
Ketone: NEGATIVE
Nitrite: NEGATIVE
Ph: 5 (ref 4.5–8.0)
Protein: NEGATIVE
RBC,UR: 1 /HPF (ref 0–5)
Specific Gravity: 1.012 (ref 1.003–1.030)
Squamous Epithelial: 1
WBC UR: 82 /HPF (ref 0–5)

## 2014-09-09 LAB — BASIC METABOLIC PANEL
ANION GAP: 6 — AB (ref 7–16)
Anion Gap: 9 (ref 7–16)
BUN: 36 mg/dL — ABNORMAL HIGH (ref 7–18)
BUN: 31 mg/dL — AB (ref 7–18)
CHLORIDE: 106 mmol/L (ref 98–107)
CO2: 31 mmol/L (ref 21–32)
CREATININE: 1.59 mg/dL — AB (ref 0.60–1.30)
Calcium, Total: 8.4 mg/dL — ABNORMAL LOW (ref 8.5–10.1)
Calcium, Total: 8.4 mg/dL — ABNORMAL LOW (ref 8.5–10.1)
Chloride: 100 mmol/L (ref 98–107)
Co2: 30 mmol/L (ref 21–32)
Creatinine: 2.18 mg/dL — ABNORMAL HIGH (ref 0.60–1.30)
EGFR (African American): 29 — ABNORMAL LOW
EGFR (African American): 41 — ABNORMAL LOW
EGFR (Non-African Amer.): 24 — ABNORMAL LOW
EGFR (Non-African Amer.): 34 — ABNORMAL LOW
Glucose: 112 mg/dL — ABNORMAL HIGH (ref 65–99)
Glucose: 107 mg/dL — ABNORMAL HIGH (ref 65–99)
OSMOLALITY: 292 (ref 275–301)
Osmolality: 287 (ref 275–301)
POTASSIUM: 3.5 mmol/L (ref 3.5–5.1)
Potassium: 2.8 mmol/L — ABNORMAL LOW (ref 3.5–5.1)
Sodium: 139 mmol/L (ref 136–145)
Sodium: 143 mmol/L (ref 136–145)

## 2014-09-09 LAB — CBC WITH DIFFERENTIAL/PLATELET
BASOS ABS: 0 10*3/uL (ref 0.0–0.1)
Basophil %: 0.2 %
Eosinophil #: 0.1 10*3/uL (ref 0.0–0.7)
Eosinophil %: 1.4 %
HCT: 30.3 % — AB (ref 35.0–47.0)
HGB: 9.6 g/dL — ABNORMAL LOW (ref 12.0–16.0)
Lymphocyte #: 0.7 10*3/uL — ABNORMAL LOW (ref 1.0–3.6)
Lymphocyte %: 9.9 %
MCH: 27.9 pg (ref 26.0–34.0)
MCHC: 31.7 g/dL — AB (ref 32.0–36.0)
MCV: 88 fL (ref 80–100)
Monocyte #: 0.5 x10 3/mm (ref 0.2–0.9)
Monocyte %: 6.6 %
Neutrophil #: 5.6 10*3/uL (ref 1.4–6.5)
Neutrophil %: 81.9 %
PLATELETS: 277 10*3/uL (ref 150–440)
RBC: 3.45 10*6/uL — AB (ref 3.80–5.20)
RDW: 17.3 % — AB (ref 11.5–14.5)
WBC: 6.9 10*3/uL (ref 3.6–11.0)

## 2014-09-09 LAB — MAGNESIUM: Magnesium: 1.8 mg/dL

## 2014-09-09 LAB — TROPONIN I: Troponin-I: <0.02 ng/mL

## 2014-09-10 LAB — CBC WITH DIFFERENTIAL/PLATELET
BASOS PCT: 0.4 %
Basophil #: 0 10*3/uL (ref 0.0–0.1)
EOS ABS: 0.1 10*3/uL (ref 0.0–0.7)
Eosinophil %: 1.6 %
HCT: 25.4 % — ABNORMAL LOW (ref 35.0–47.0)
HGB: 8.1 g/dL — AB (ref 12.0–16.0)
Lymphocyte #: 1 10*3/uL (ref 1.0–3.6)
Lymphocyte %: 18.8 %
MCH: 28.1 pg (ref 26.0–34.0)
MCHC: 32 g/dL (ref 32.0–36.0)
MCV: 88 fL (ref 80–100)
MONOS PCT: 6.7 %
Monocyte #: 0.3 x10 3/mm (ref 0.2–0.9)
Neutrophil #: 3.8 10*3/uL (ref 1.4–6.5)
Neutrophil %: 72.5 %
Platelet: 223 10*3/uL (ref 150–440)
RBC: 2.9 10*6/uL — AB (ref 3.80–5.20)
RDW: 17 % — AB (ref 11.5–14.5)
WBC: 5.2 10*3/uL (ref 3.6–11.0)

## 2014-09-10 LAB — BASIC METABOLIC PANEL
ANION GAP: 4 — AB (ref 7–16)
BUN: 25 mg/dL — AB (ref 7–18)
CALCIUM: 8 mg/dL — AB (ref 8.5–10.1)
CHLORIDE: 110 mmol/L — AB (ref 98–107)
Co2: 29 mmol/L (ref 21–32)
Creatinine: 1.26 mg/dL (ref 0.60–1.30)
EGFR (Non-African Amer.): 45 — ABNORMAL LOW
GFR CALC AF AMER: 54 — AB
GLUCOSE: 93 mg/dL (ref 65–99)
OSMOLALITY: 289 (ref 275–301)
Potassium: 4.1 mmol/L (ref 3.5–5.1)
Sodium: 143 mmol/L (ref 136–145)

## 2014-09-10 LAB — MAGNESIUM: Magnesium: 1.6 mg/dL — ABNORMAL LOW

## 2014-09-11 LAB — MAGNESIUM: MAGNESIUM: 1.6 mg/dL — AB

## 2014-09-11 LAB — BASIC METABOLIC PANEL
Anion Gap: 6 — ABNORMAL LOW (ref 7–16)
BUN: 15 mg/dL (ref 7–18)
CO2: 28 mmol/L (ref 21–32)
Calcium, Total: 8.2 mg/dL — ABNORMAL LOW (ref 8.5–10.1)
Chloride: 106 mmol/L (ref 98–107)
Creatinine: 0.98 mg/dL (ref 0.60–1.30)
EGFR (African American): >60
GFR CALC NON AF AMER: 60 — AB
GLUCOSE: 98 mg/dL (ref 65–99)
Osmolality: 280 (ref 275–301)
Potassium: 3.7 mmol/L (ref 3.5–5.1)
SODIUM: 140 mmol/L (ref 136–145)

## 2014-09-11 LAB — URINE CULTURE

## 2014-10-14 ENCOUNTER — Inpatient Hospital Stay: Payer: Self-pay | Admitting: Internal Medicine

## 2014-10-14 LAB — URINALYSIS, COMPLETE
Bilirubin,UR: NEGATIVE
GLUCOSE, UR: NEGATIVE mg/dL (ref 0–75)
Hyaline Cast: 2
Ketone: NEGATIVE
Nitrite: POSITIVE
Ph: 5 (ref 4.5–8.0)
Protein: NEGATIVE
RBC,UR: 3 /HPF (ref 0–5)
SPECIFIC GRAVITY: 1.016 (ref 1.003–1.030)
Squamous Epithelial: NONE SEEN
WBC UR: 120 /HPF (ref 0–5)

## 2014-10-14 LAB — COMPREHENSIVE METABOLIC PANEL
ALBUMIN: 2.9 g/dL — AB (ref 3.4–5.0)
ALT: 16 U/L
Alkaline Phosphatase: 55 U/L
Anion Gap: 7 (ref 7–16)
BUN: 54 mg/dL — AB (ref 7–18)
Bilirubin,Total: 0.4 mg/dL (ref 0.2–1.0)
Calcium, Total: 8.8 mg/dL (ref 8.5–10.1)
Chloride: 100 mmol/L (ref 98–107)
Co2: 32 mmol/L (ref 21–32)
Creatinine: 2.61 mg/dL — ABNORMAL HIGH (ref 0.60–1.30)
EGFR (Non-African Amer.): 19 — ABNORMAL LOW
GFR CALC AF AMER: 23 — AB
GLUCOSE: 117 mg/dL — AB (ref 65–99)
Osmolality: 293 (ref 275–301)
Potassium: 3.7 mmol/L (ref 3.5–5.1)
SGOT(AST): 34 U/L (ref 15–37)
Sodium: 139 mmol/L (ref 136–145)
TOTAL PROTEIN: 6.1 g/dL — AB (ref 6.4–8.2)

## 2014-10-14 LAB — CBC
HCT: 30.1 % — AB (ref 35.0–47.0)
HGB: 9.7 g/dL — AB (ref 12.0–16.0)
MCH: 28.2 pg (ref 26.0–34.0)
MCHC: 32.1 g/dL (ref 32.0–36.0)
MCV: 88 fL (ref 80–100)
Platelet: 195 10*3/uL (ref 150–440)
RBC: 3.43 10*6/uL — ABNORMAL LOW (ref 3.80–5.20)
RDW: 18 % — ABNORMAL HIGH (ref 11.5–14.5)
WBC: 17.9 10*3/uL — ABNORMAL HIGH (ref 3.6–11.0)

## 2014-10-14 LAB — CK TOTAL AND CKMB (NOT AT ARMC)
CK, Total: 439 U/L — ABNORMAL HIGH (ref 26–192)
CK-MB: 6.5 ng/mL — ABNORMAL HIGH (ref 0.5–3.6)

## 2014-10-14 LAB — TROPONIN I

## 2014-10-14 LAB — PROTIME-INR
INR: 1.5
Prothrombin Time: 18.2 secs — ABNORMAL HIGH (ref 11.5–14.7)

## 2014-10-14 LAB — APTT: Activated PTT: 36.4 secs — ABNORMAL HIGH (ref 23.6–35.9)

## 2014-10-15 LAB — BASIC METABOLIC PANEL
Anion Gap: 9 (ref 7–16)
BUN: 48 mg/dL — ABNORMAL HIGH (ref 7–18)
CO2: 29 mmol/L (ref 21–32)
CREATININE: 1.92 mg/dL — AB (ref 0.60–1.30)
Calcium, Total: 8 mg/dL — ABNORMAL LOW (ref 8.5–10.1)
Chloride: 107 mmol/L (ref 98–107)
EGFR (African American): 33 — ABNORMAL LOW
GFR CALC NON AF AMER: 27 — AB
GLUCOSE: 107 mg/dL — AB (ref 65–99)
Osmolality: 302 (ref 275–301)
Potassium: 3.1 mmol/L — ABNORMAL LOW (ref 3.5–5.1)
SODIUM: 145 mmol/L (ref 136–145)

## 2014-10-15 LAB — MAGNESIUM: Magnesium: 1.8 mg/dL

## 2014-10-15 LAB — CBC WITH DIFFERENTIAL/PLATELET
BASOS ABS: 0 10*3/uL (ref 0.0–0.1)
BASOS PCT: 0.3 %
EOS PCT: 2.7 %
Eosinophil #: 0.4 10*3/uL (ref 0.0–0.7)
HCT: 26.8 % — ABNORMAL LOW (ref 35.0–47.0)
HGB: 8.4 g/dL — ABNORMAL LOW (ref 12.0–16.0)
Lymphocyte #: 0.7 10*3/uL — ABNORMAL LOW (ref 1.0–3.6)
Lymphocyte %: 4.5 %
MCH: 27.7 pg (ref 26.0–34.0)
MCHC: 31.4 g/dL — ABNORMAL LOW (ref 32.0–36.0)
MCV: 89 fL (ref 80–100)
MONOS PCT: 4.1 %
Monocyte #: 0.6 x10 3/mm (ref 0.2–0.9)
NEUTROS ABS: 13.5 10*3/uL — AB (ref 1.4–6.5)
Neutrophil %: 88.4 %
Platelet: 162 10*3/uL (ref 150–440)
RBC: 3.03 10*6/uL — ABNORMAL LOW (ref 3.80–5.20)
RDW: 17.8 % — AB (ref 11.5–14.5)
WBC: 15.3 10*3/uL — AB (ref 3.6–11.0)

## 2014-10-15 LAB — POTASSIUM: Potassium: 4.7 mmol/L (ref 3.5–5.1)

## 2014-10-15 LAB — PHOSPHORUS: PHOSPHORUS: 2.3 mg/dL — AB (ref 2.5–4.9)

## 2014-10-16 LAB — BASIC METABOLIC PANEL
Anion Gap: 8 (ref 7–16)
BUN: 27 mg/dL — ABNORMAL HIGH (ref 7–18)
CALCIUM: 7.9 mg/dL — AB (ref 8.5–10.1)
CO2: 28 mmol/L (ref 21–32)
Chloride: 108 mmol/L — ABNORMAL HIGH (ref 98–107)
Creatinine: 1.12 mg/dL (ref 0.60–1.30)
EGFR (African American): >60
EGFR (Non-African Amer.): 51 — ABNORMAL LOW
Glucose: 99 mg/dL (ref 65–99)
OSMOLALITY: 292 (ref 275–301)
Potassium: 3.7 mmol/L (ref 3.5–5.1)
Sodium: 144 mmol/L (ref 136–145)

## 2014-10-16 LAB — PHOSPHORUS: Phosphorus: 2 mg/dL — ABNORMAL LOW (ref 2.5–4.9)

## 2014-10-17 LAB — URINE CULTURE

## 2014-10-17 LAB — PHOSPHORUS: PHOSPHORUS: 2.5 mg/dL (ref 2.5–4.9)

## 2014-10-17 LAB — WBC: WBC: 8.5 10*3/uL (ref 3.6–11.0)

## 2014-10-17 LAB — CLOSTRIDIUM DIFFICILE(ARMC)

## 2014-10-19 LAB — CULTURE, BLOOD (SINGLE)

## 2014-11-05 ENCOUNTER — Inpatient Hospital Stay: Payer: Self-pay | Admitting: Internal Medicine

## 2015-01-11 NOTE — Op Note (Signed)
PATIENT NAME:  Gina DesanctisBLACK, Santiana L MR#:  [id number] DATE OF BIRTH:  Apr 11, 1946  DATE OF PROCEDURE:  07/30/2014  PREOPERATIVE DIAGNOSIS: Severe degenerative arthritis, right knee.   POSTOPERATIVE DIAGNOSIS: Severe degenerative arthritis, right knee.   PROCEDURE: Low contact stress right total knee arthroplasty.   SURGEON:  Clare Gandyhristopher E Jaylynne Birkhead, MD    ANESTHESIA: General.   COMPLICATIONS: None.   TOURNIQUET TIME: Was 114 minutes.   DRAINS: Were 2 Autovacs.   DESCRIPTION OF PROCEDURE: Two grams of Ancef were given intravenously prior to the procedure. General anesthesia is induced. The right lower extremity was thoroughly prepped with alcohol and ChloraPrep and draped in standard sterile fashion. The extremity was wrapped out with the Esmarch bandage and pneumatic tourniquet elevated to 350 mmHg.   A standard anterior longitudinal incision is made and the dissection carried down to both the medial and lateral retinaculum. Standard medial parapatellar incision is made and the knee is flexed. Standard debridement of osteophytes and synovitis is done. There is a moderate amount of clear yellow fluid present and specimen is sent for anaerobic and aerobic culture. There is no evidence of infection. Proximal tibial cutter is then put into place and the tibial cut made. The knee is placed in extension and medial release is performed in order to balance the ligaments. The knee is then flexed and the distal femur is sized as a medium. The cutting block is put into place and pinned and the anterior and posterior cuts made. A 10 mm flexion block is put into place and the alignment is seen to be excellent. All pins are removed.   The distal femoral cutting block 4 degrees is impacted into place and pinned and the distal femoral cut is made. A 10 mm extension block is put into place and there is seen to be good stability and alignment. Femoral shaping guide is put into place and the appropriate cuts made.  Proximal tibia is sized as a 2.5, and the   DICTATION ENDS HERE.  Please see addendum for continuation of dictation.    ____________________________ Clare Gandyhristopher E. Leiyah Maultsby, MD ces:nt D: 07/30/2014 14:21:00 ET T: 07/30/2014 18:48:48 ET JOB#: [id number]  cc: Clare Gandyhristopher E. Phenix Vandermeulen, MD, <Dictator> Clare GandyHRISTOPHER E Ethylene Reznick MD ELECTRONICALLY SIGNED 09/02/2014 11:01

## 2015-01-11 NOTE — Discharge Summary (Signed)
PATIENT NAME:  Gina DesanctisBLACK, Shaylen L MR#:  [id number] DATE OF BIRTH:  12/06/1945  DATE OF ADMISSION:  07/30/2014 DATE OF DISCHARGE:  08/05/2014   DISCHARGE DIAGNOSES:  1.  Severe degenerative arthritis, right knee.  2.  History of hypertension.  3.  Irritable bowel syndrome.  4.  Fibromyalgia.  5.  Spinal cord stimulator.  6.  Postoperative hypotension due to dehydration.  7.  Pulmonary pneumonia.   OPERATIONS OR PROCEDURES PERFORMED: Right total knee arthroplasty on 07/30/2014.   HISTORY AND PHYSICAL EXAMINATION: As written on admission.   LABORATORY DATA: As noted on the chart.   COURSE IN HOSPITAL: Following the appropriate preoperative medical clearance and work-up and laboratory evaluation, the patient was taken to the Operating Room on 07/30/2014 where total knee arthroplasty was performed without difficulty. Postoperative x-ray was without complication. The patient did well, but did develop hypotension, which was felt to be related to medication and dehydration. She was transfused 1 unit of packed cells. Consultation was obtained with the hospitalist, and chest x-ray demonstrated a lower lobe pneumonia.  She was treated with intravenous Levaquin. At no time was she febrile and, with hydration,  her hypotension resolved.   By 07/05/2014, the patient was doing quite well and progressing well in physical therapy. She is discharged to [facility]. Her medications are listed as on the order sheet. She is to be on Levaquin 750 mg every 48 hours for the pneumonia. She is to be on Lovenox 40 mg subcutaneously every day as prophylaxis for venous thrombosis and pulmonary embolism; at the time of discharge, this medication was actually changed to Eliquis, and is as on the order sheet. She is to have physical therapy for range of motion of the knee and the usual total knee protocol and may be weight-bearing as tolerated. She is to return to the office in 10 to 12 days to see Dr. Katrinka BlazingSmith.     ____________________________ Clare Gandyhristopher E. Cobin Cadavid, MD ces:MT D: 08/05/2014 11:02:00 ET T: 08/05/2014 11:38:11 ET JOB#: [id number]  cc: Clare Gandyhristopher E. Kolleen Ochsner, MD, <Dictator> Clare GandyHRISTOPHER E Mitcheal Sweetin MD ELECTRONICALLY SIGNED 08/07/2014 15:56

## 2015-01-11 NOTE — Consult Note (Signed)
Brief Consult Note: Diagnosis: Hypotension secondary to dehydration, narcotics, and anti-hypertensive medications.   Patient was seen by consultant.   Consult note dictated.   Orders entered.   Comments: S/p TKR and nauseous; likely volume deplete.  Pt has received small boluses of fluid.  No CHF so she should be able to tolerate liter boluses in the event of significant hypotension, as well maintenance fluid.  Currently normotensive after holding BP meds and d/c Dilaudid.  (Fentanyl patches also d/c'd but should not have significant effect on pressure).  Continue fluid resuscitation.  If pt requires more pain meds consider long-acting analgesic with short-acting for breakthru pain. (Start with low dose as may suppress heart rate).  Electronic Signatures: Arnaldo Nataliamond, Michael S (MD)  (Signed [phone]-Nov-15 22:20)  Authored: Brief Consult Note   Last Updated: 12-Nov-15 22:20 by Arnaldo Nataliamond, Michael S (MD)

## 2015-01-11 NOTE — Discharge Summary (Signed)
PATIENT NAME:  Gina DesanctisBLACK, Gina L MR#:  [id number] DATE OF BIRTH:  Mar 31, 1946  DATE OF ADMISSION:  09/09/2014 DATE OF DISCHARGE:  09/11/2014  PRIMARY CARE PHYSICIAN: Dr. Anna Genreonroy.  DISCHARGE DIAGNOSES: Acute renal failure, urinary tract infection, recurrent falls, hypertension, anemia of chronic disease.   CONDITION: Stable.   CODE STATUS: Full Code.   HOME MEDICATIONS: Please refer to the medication reconciliation list.   DIET: Low-sodium, low-fat, low-cholesterol diet.   ACTIVITY: As tolerated.   FOLLOW-UP CARE: Follow with PCP in 1 to 2 weeks.   REASON FOR ADMISSION: Fall, syncope, and weakness.   HOSPITAL COURSE: The patient is a 69 year old, Caucasian female, with a history of hypertension, who was sent to the ED due to a fall, syncope and weakness. The patient has recurrent syncope and falls for several days. The patient was found down on the floor, conscious. For detailed history and physical examinations, please refer to the admission note dictated by Dr.  Elpidio AnisSudini. On admission date, the patient's BUN 36, creatinine 2.18, potassium 2.8, WBC 8.9. CAT scan of head did not show any acute abnormality.   1. Renal failure. After admission, the patient has been treated with IV fluid support. Hydrochlorothiazide and losartan was discontinued. [name] renal function improved to normal range.  2. Recurrent fall and weakness. The patient got physical therapy, suggested the patient needs subacute rehabilitation.  3. For UTI, the patient has been treated with Rocephin IV, but the patient has no symptoms.  4. Hypertension. [name] blood pressure is uncontrolled. Since the patient's renal function is normal, we will resume the patient's home medications. 5. The patient has right lower extremity swelling, but her venous duplex did not show any DVT.  The patient has no complaints. Vital signs are stable.   She is clinically stable and will be discharged to a skilled nursing facility  today.   I discussed the patient's discharge plan with the patient's nurse, case manager and social worker.   TIME SPENT: About 38 minutes.    ____________________________ Shaune PollackQing Declan Adamson, MD qc:JT D: 09/11/2014 11:19:53 ET T: 09/11/2014 13:03:04 ET JOB#: [id number]  cc: Shaune PollackQing Lytle Malburg, MD, <Dictator> Shaune PollackQING Jasani Dolney MD ELECTRONICALLY SIGNED 09/11/2014 16:12

## 2015-01-11 NOTE — Consult Note (Signed)
PATIENT NAME:  Gina DesanctisBLACK, Avelina L MR#:  [id number] DATE OF BIRTH:  August 08, 1946  DATE OF CONSULTATION:  07/30/2014  REFERRING PHYSICIAN:  Clare Gandyhristopher E. Smith, MD  CONSULTING PHYSICIAN:  Kelton PillarMichael S. Sheryle Hailiamond, MD  REASON FOR CONSULTATION: Hypotension.   HISTORY OF PRESENT ILLNESS: This is a 69 year old Caucasian female who underwent total right knee replacement for severe degenerative arthritis 2 days ago. The patient had been experiencing episodes of hypotension with systolic blood pressures as low as 60. During these periods, the patient states that she was feeling somewhat weak, but otherwise had no complaints. She currently feels well after receiving some fluid boluses of approximately 250 mL, and she has now begun to eat. Notably, she was feeling nauseous for the last few days and this morning and  admits to not having had much to drink or eat.  REVIEW OF SYSTEMS.  CONSTITUTIONAL: The patient denies fever but admits to general weakness.  EYES: Denies inflammation or blurred vision.  EARS, NOSE AND THROAT: Denies tinnitus or sore throat.  RESPIRATORY: Denies shortness of breath or cough.  CARDIOVASCULAR: Denies chest pain or palpitations.  GASTROINTESTINAL: Admits to nausea but denies vomiting, diarrhea, or abdominal pain.  GENITOURINARY: Denies increased frequency, hesitancy, urgency, or dysuria. HEMATOLOGIC AND LYMPHATIC: Denies easy bruising or bleeding.  ENDOCRINE: Denies polyuria or nocturia.  MUSCULOSKELETAL: Denies myalgias but admits to arthralgia before and after surgery.  INTEGUMENTARY: Denies rashes or lesions.  NEUROLOGIC: Denies numbness in her extremities, or dysarthria.  PSYCHIATRIC: Denies depression or suicidal ideation.   MEDICATIONS:  1.  Oxycodone 30 mg 1 tablet p.o. 5 times a day as needed for pain.  2.  Gabapentin 600 mg 1 tablet p.o. t.i.d.  3.  Paroxetine 20 mg 1 tablet p.o. daily.  4.  Ondansetron 4 mg 1 tablet p.o. t.i.d. as needed for nausea or vomiting.  5.   Atorvastatin 20 mg 1 tablet p.o. at bedtime.  6.  Hydrochlorothiazide with losartan 25 mg/100 mg 1 tab p.o. daily.  7.  Temazepam 30 mg 1 capsule p.o. at bedtime.  8.  Ventolin 90 mcg inhaler 2 puffs inhaled 4 times a day as needed for coughing, wheezing, and shortness of breath.  9.  Amlodipine 10 mg 1 tab p.o. daily.   ALLERGIES: LISINOPRIL.   PERTINENT LABORATORY RESULTS AND RADIOGRAPHIC FINDINGS: Serum glucose 166, BUN 11, creatinine 1.02, sodium 135, potassium 3.4, chloride 97, bicarbonate 29, calcium is 8.3. Hemoglobin is 10.8. These lab results are from yesterday. Right knee x-ray from the day of surgery shows no complicating features to the right knee replacement. There are soft tissue drains in place and no hardware within the knee.    PHYSICAL EXAMINATION:  VITAL SIGNS: Temperature is 97.8, pulse 85, respirations 18, blood pressure currently 126/82, pulse oximetry 96% on 1 L of oxygen via nasal cannula.  GENERAL: The patient is alert and oriented x 3, in no apparent distress.  HEENT: Normocephalic, atraumatic. Pupils equal, round, and reactive to light and accommodation. Extraocular movements are intact. Mucous membranes are moist.  NECK: Trachea is midline. No adenopathy.  CHEST: Symmetric and atraumatic.  CARDIOVASCULAR: Regular rate and rhythm. Normal S1, S2. No rubs, clicks, or murmurs appreciated.  LUNGS: Clear to auscultation bilaterally. Normal effort and excursion.  ABDOMEN: Positive bowel sounds. Soft, nontender, nondistended. No hepatosplenomegaly.  GENITOURINARY: Deferred.  MUSCULOSKELETAL: The patient moves all 4 extremities, the right lower extremity is immobilized. She has 5/5 strength in upper extremities bilaterally and in the left lower extremity. I have not  tested the right lower extremity, but she has 2+ pulses in her feet bilaterally.  SKIN: No rashes or lesions.  EXTREMITIES: No clubbing, cyanosis, or edema. The immobilizer is in place on the right knee.   NEUROLOGIC: Cranial nerves II through XII grossly intact.  PSYCHIATRIC: Mood is normal. Affect is congruent.   ASSESSMENT AND PLAN: This is a 69 year old female status post right knee replacement postoperative day #2. Hypotension is resolved.  1.  Hypotension likely multifactorial. The patient has not been eating or drinking much since surgery. She states she is passing gas and so we have little concern of ileus. She simply has not had much of an appetite, which has been compounded by nausea and likely has led to some volume depletion. She has received some small fluid boluses which have helped improve her blood pressure. She does not have a history of congestive heart failure. Thus, we do not need to be very conservative with her fluid resuscitation. In the event of further episodes of hypotension, she may receive full 1 L boluses of fluid. I have placed her on maintenance fluid of normal saline with supplemental potassium. Her primary care team has already discontinued her antihypertensive medicines at this time, which is wise. The patient was also receiving Dilaudid injections when her pain was acutely elevated after surgery. This also likely contributed to hypotension. I agree with discontinuing intravenous narcotics. If the patient requires more pain medicines, I would consider adding a long-acting analgesic for baseline pain relief with short-acting oral narcotics for breakthrough pain. I recommend starting with low-dose long-acting pain medicine, as higher doses may suppress her heart rate.  2.  Hypokalemia. I have added potassium to the patient's maintenance fluid and also ordered some oral potassium replacement.  3.  Deep vein thrombosis prophylaxis. Agree with Lovenox injections.  4.  Gastrointestinal prophylaxis. Ranitidine as needed before meals if the patient complains of heartburn.  TIME SPENT ON ORDERS AND PATIENT EXAMINATION: Approximately 35 minutes.     ____________________________ Kelton Pillar. Sheryle Hail, MD msd:ST D: 08/01/2014 22:35:52 ET T: 08/02/2014 00:10:53 ET JOB#: [id number]  cc: Kelton Pillar. Sheryle Hail, MD, <Dictator> Kelton Pillar Alliya Marcon MD ELECTRONICALLY SIGNED 08/08/2014 1:27

## 2015-01-11 NOTE — Op Note (Signed)
PATIENT NAME:  Gina Lowe, Gina Lowe MR#:  [id number] DATE OF BIRTH:  June 23, 1946  DATE OF PROCEDURE:  07/30/2014  ADDENDUM   The proximal tibia is sized as a 2.5 and the 2.5 tibial template is put into place and pinned. A central guide hole is then made. The 10 mm rotating platform trial and the medium femoral trial are impacted into place. Range of motion is excellent with excellent stability and full extension. The patella is then prepared in the usual manner. All trial components are removed. The wound is then thoroughly irrigated multiple times with pulsatile lavage. The synovial surfaces are carefully infiltrated with 20 mL of Exparel mixed with 40 mL of normal saline. In addition, 30 mL of Marcaine plain is injected subcutaneously. The cement is mixed and then a 2.5. Proximal tibia is then impacted into place. The 10 mm rotating platform is put into place. The porous-coated medium tibia is impacted into place with some cement in the intercondylar notch and also in the distal femoral holes. Porous-coated patellar prosthesis is impacted into place. Range of motion demonstrates excellent stability and motion. A small amount of excess cement is removed. The wound is thoroughly irrigated multiple times again with pulsatile lavage. Two Autovac drains were brought out through separate stab wound incisions. The medial retinaculum is repaired with 0 Ti-Cron. Subcutaneous tissue is closed with 2-0 Vicryl and the skin is closed with the skin stapler. A soft bulky dressing with a Polar Care and knee immobilizer are applied. The tourniquet is released. The patient is returned to the recovery room in satisfactory condition having tolerated the procedure quite well.    ____________________________ Clare Gandyhristopher E. Aasha Dina, MD ces:at D: 08/25/2014 12:00:41 ET T: 08/25/2014 18:13:31 ET JOB#: [id number]  cc: Clare Gandyhristopher E. Thai Burgueno, MD, <Dictator> Clare GandyHRISTOPHER E Tynetta Bachmann MD ELECTRONICALLY SIGNED 08/27/2014 9:57

## 2015-01-14 ENCOUNTER — Observation Stay (HOSPITAL_COMMUNITY)
Admission: EM | Admit: 2015-01-14 | Discharge: 2015-01-15 | Disposition: A | Discharge status: Home / Self Care | Payer: PPO | Attending: Internal Medicine | Admitting: Internal Medicine

## 2015-01-14 ENCOUNTER — Emergency Department (HOSPITAL_COMMUNITY): Payer: PPO

## 2015-01-14 ENCOUNTER — Encounter (HOSPITAL_COMMUNITY): Payer: Self-pay | Admitting: Physical Medicine and Rehabilitation

## 2015-01-14 DIAGNOSIS — Z9049 Acquired absence of other specified parts of digestive tract: Secondary | ICD-10-CM | POA: Diagnosis not present

## 2015-01-14 DIAGNOSIS — R001 Bradycardia, unspecified: Secondary | ICD-10-CM | POA: Insufficient documentation

## 2015-01-14 DIAGNOSIS — F419 Anxiety disorder, unspecified: Secondary | ICD-10-CM | POA: Diagnosis not present

## 2015-01-14 DIAGNOSIS — M549 Dorsalgia, unspecified: Secondary | ICD-10-CM | POA: Insufficient documentation

## 2015-01-14 DIAGNOSIS — Z90710 Acquired absence of both cervix and uterus: Secondary | ICD-10-CM | POA: Insufficient documentation

## 2015-01-14 DIAGNOSIS — R55 Syncope and collapse: Secondary | ICD-10-CM | POA: Diagnosis not present

## 2015-01-14 DIAGNOSIS — D649 Anemia, unspecified: Secondary | ICD-10-CM | POA: Diagnosis not present

## 2015-01-14 DIAGNOSIS — G629 Polyneuropathy, unspecified: Secondary | ICD-10-CM | POA: Diagnosis not present

## 2015-01-14 DIAGNOSIS — I10 Essential (primary) hypertension: Secondary | ICD-10-CM | POA: Diagnosis not present

## 2015-01-14 DIAGNOSIS — Z96652 Presence of left artificial knee joint: Secondary | ICD-10-CM | POA: Diagnosis not present

## 2015-01-14 DIAGNOSIS — F329 Major depressive disorder, single episode, unspecified: Secondary | ICD-10-CM | POA: Diagnosis not present

## 2015-01-14 DIAGNOSIS — Y92009 Unspecified place in unspecified non-institutional (private) residence as the place of occurrence of the external cause: Secondary | ICD-10-CM | POA: Diagnosis not present

## 2015-01-14 DIAGNOSIS — N179 Acute kidney failure, unspecified: Secondary | ICD-10-CM | POA: Diagnosis present

## 2015-01-14 DIAGNOSIS — W19XXXA Unspecified fall, initial encounter: Secondary | ICD-10-CM | POA: Insufficient documentation

## 2015-01-14 DIAGNOSIS — M545 Low back pain: Secondary | ICD-10-CM

## 2015-01-14 DIAGNOSIS — R4182 Altered mental status, unspecified: Secondary | ICD-10-CM | POA: Insufficient documentation

## 2015-01-14 DIAGNOSIS — Z79899 Other long term (current) drug therapy: Secondary | ICD-10-CM

## 2015-01-14 DIAGNOSIS — G8929 Other chronic pain: Secondary | ICD-10-CM | POA: Diagnosis not present

## 2015-01-14 HISTORY — DX: Repeated falls: R29.6

## 2015-01-14 HISTORY — DX: Anemia, unspecified: D64.9

## 2015-01-14 HISTORY — DX: Low back pain, unspecified: M54.50

## 2015-01-14 HISTORY — DX: Personal history of peptic ulcer disease: Z87.11

## 2015-01-14 HISTORY — DX: Carpal tunnel syndrome, unspecified upper limb: G56.00

## 2015-01-14 HISTORY — DX: Diverticulosis of intestine, part unspecified, without perforation or abscess without bleeding: K57.90

## 2015-01-14 HISTORY — DX: Syncope and collapse: R55

## 2015-01-14 HISTORY — DX: Ulcer of esophagus without bleeding: K22.10

## 2015-01-14 HISTORY — DX: Irritable bowel syndrome, unspecified: K58.9

## 2015-01-14 HISTORY — DX: Personal history of other diseases of the digestive system: Z87.19

## 2015-01-14 HISTORY — DX: Pure hypercholesterolemia, unspecified: E78.00

## 2015-01-14 HISTORY — DX: Low back pain: M54.5

## 2015-01-14 HISTORY — DX: Gastro-esophageal reflux disease without esophagitis: K21.9

## 2015-01-14 HISTORY — DX: Long term (current) use of opiate analgesic: Z79.891

## 2015-01-14 HISTORY — DX: Polyneuropathy, unspecified: G62.9

## 2015-01-14 HISTORY — DX: Unspecified osteoarthritis, unspecified site: M19.90

## 2015-01-14 HISTORY — DX: Fibromyalgia: M79.7

## 2015-01-14 HISTORY — DX: Other chronic pain: G89.29

## 2015-01-14 LAB — TSH: TSH: 0.138 u[IU]/mL — ABNORMAL LOW (ref 0.350–4.500)

## 2015-01-14 LAB — COMPREHENSIVE METABOLIC PANEL
ALBUMIN: 3.6 g/dL (ref 3.5–5.2)
ALT: 11 U/L (ref 0–35)
AST: 16 U/L (ref 0–37)
Alkaline Phosphatase: 80 U/L (ref 39–117)
Anion gap: 11 (ref 5–15)
BUN: 64 mg/dL — ABNORMAL HIGH (ref 6–23)
CO2: 24 mmol/L (ref 19–32)
Calcium: 9.5 mg/dL (ref 8.4–10.5)
Chloride: 103 mmol/L (ref 96–112)
Creatinine, Ser: 2.04 mg/dL — ABNORMAL HIGH (ref 0.50–1.10)
GFR calc Af Amer: 27 mL/min — ABNORMAL LOW (ref >90)
GFR calc non Af Amer: 24 mL/min — ABNORMAL LOW (ref >90)
Glucose, Bld: 138 mg/dL — ABNORMAL HIGH (ref 70–99)
Potassium: 4.3 mmol/L (ref 3.5–5.1)
Sodium: 138 mmol/L (ref 135–145)
Total Bilirubin: 0.4 mg/dL (ref 0.3–1.2)
Total Protein: 6.9 g/dL (ref 6.0–8.3)

## 2015-01-14 LAB — CBC WITH DIFFERENTIAL/PLATELET
Basophils Absolute: 0 10*3/uL (ref 0.0–0.1)
Basophils Relative: 0 % (ref 0–1)
Eosinophils Absolute: 0 10*3/uL (ref 0.0–0.7)
Eosinophils Relative: 0 % (ref 0–5)
HCT: 29.7 % — ABNORMAL LOW (ref 36.0–46.0)
Hemoglobin: 9.3 g/dL — ABNORMAL LOW (ref 12.0–15.0)
LYMPHS PCT: 8 % — AB (ref 12–46)
Lymphs Abs: 0.5 10*3/uL — ABNORMAL LOW (ref 0.7–4.0)
MCH: 27.6 pg (ref 26.0–34.0)
MCHC: 31.3 g/dL (ref 30.0–36.0)
MCV: 88.1 fL (ref 78.0–100.0)
Monocytes Absolute: 0.2 10*3/uL (ref 0.1–1.0)
Monocytes Relative: 3 % (ref 3–12)
Neutro Abs: 5 10*3/uL (ref 1.7–7.7)
Neutrophils Relative %: 89 % — ABNORMAL HIGH (ref 43–77)
PLATELETS: 240 10*3/uL (ref 150–400)
RBC: 3.37 MIL/uL — ABNORMAL LOW (ref 3.87–5.11)
RDW: 15 % (ref 11.5–15.5)
WBC: 5.6 10*3/uL (ref 4.0–10.5)

## 2015-01-14 LAB — RAPID URINE DRUG SCREEN, HOSP PERFORMED
Amphetamines: NOT DETECTED
BARBITURATES: NOT DETECTED
Benzodiazepines: POSITIVE — AB
COCAINE: NOT DETECTED
OPIATES: POSITIVE — AB
Tetrahydrocannabinol: NOT DETECTED

## 2015-01-14 LAB — URINALYSIS, ROUTINE W REFLEX MICROSCOPIC
Bilirubin Urine: NEGATIVE
Glucose, UA: NEGATIVE mg/dL
HGB URINE DIPSTICK: NEGATIVE
Ketones, ur: NEGATIVE mg/dL
LEUKOCYTES UA: NEGATIVE
Nitrite: NEGATIVE
PH: 5 (ref 5.0–8.0)
Protein, ur: NEGATIVE mg/dL
Specific Gravity, Urine: 1.016 (ref 1.005–1.030)
Urobilinogen, UA: 0.2 mg/dL (ref 0.0–1.0)

## 2015-01-14 LAB — TROPONIN I: Troponin I: <0.03 ng/mL (ref <0.031)

## 2015-01-14 LAB — MAGNESIUM: Magnesium: 2.4 mg/dL (ref 1.5–2.5)

## 2015-01-14 LAB — PHOSPHORUS: Phosphorus: 4.2 mg/dL (ref 2.3–4.6)

## 2015-01-14 LAB — LACTIC ACID, PLASMA: Lactic Acid, Venous: 1.1 mmol/L (ref 0.5–2.0)

## 2015-01-14 LAB — CK: Total CK: 177 U/L (ref 7–177)

## 2015-01-14 MED ORDER — HEPARIN SODIUM (PORCINE) 5000 UNIT/ML IJ SOLN
5000.0000 [IU] | Freq: Three times a day (TID) | INTRAMUSCULAR | Status: DC
  Administered 2015-01-14 – 2015-01-15 (×3): 5000 [IU] via SUBCUTANEOUS
  Filled 2015-01-14 (×3): qty 1

## 2015-01-14 MED ORDER — ONDANSETRON 4 MG PO TBDP
4.0000 mg | ORAL_TABLET | Freq: Three times a day (TID) | ORAL | Status: DC | PRN
  Filled 2015-01-14: qty 1

## 2015-01-14 MED ORDER — ASPIRIN 81 MG PO CHEW
81.0000 mg | CHEWABLE_TABLET | Freq: Every day | ORAL | Status: DC
  Administered 2015-01-15: 81 mg via ORAL
  Filled 2015-01-14: qty 1

## 2015-01-14 MED ORDER — TEMAZEPAM 15 MG PO CAPS
15.0000 mg | ORAL_CAPSULE | Freq: Every evening | ORAL | Status: DC | PRN
  Administered 2015-01-14: 15 mg via ORAL
  Filled 2015-01-14: qty 1

## 2015-01-14 MED ORDER — SODIUM CHLORIDE 0.9 % IV SOLN: INTRAVENOUS | Status: DC

## 2015-01-14 MED ORDER — ENSURE ENLIVE PO LIQD
237.0000 mL | Freq: Two times a day (BID) | ORAL | Status: DC
  Administered 2015-01-15: 237 mL via ORAL

## 2015-01-14 MED ORDER — OXYCODONE-ACETAMINOPHEN 5-325 MG PO TABS
1.0000 | ORAL_TABLET | Freq: Four times a day (QID) | ORAL | Status: DC | PRN
  Administered 2015-01-15: 1 via ORAL
  Filled 2015-01-14: qty 1

## 2015-01-14 MED ORDER — SODIUM CHLORIDE 0.9 % IV SOLN
INTRAVENOUS | Status: DC
Start: 2015-01-14 — End: 2015-01-15
  Administered 2015-01-14 – 2015-01-15 (×2): via INTRAVENOUS

## 2015-01-14 MED ORDER — ADULT MULTIVITAMIN W/MINERALS CH
1.0000 | ORAL_TABLET | Freq: Every day | ORAL | Status: DC
  Administered 2015-01-14 – 2015-01-15 (×2): 1 via ORAL
  Filled 2015-01-14 (×2): qty 1

## 2015-01-14 MED ORDER — PAROXETINE HCL 20 MG PO TABS
20.0000 mg | ORAL_TABLET | Freq: Every morning | ORAL | Status: DC
  Administered 2015-01-15: 20 mg via ORAL
  Filled 2015-01-14: qty 1

## 2015-01-14 NOTE — ED Notes (Signed)
Paged Bhardwaj to LisbonAnnette, RN @ [phone]

## 2015-01-14 NOTE — ED Notes (Signed)
Pt returned from radiology.

## 2015-01-14 NOTE — H&P (Signed)
Date: 01/14/2015               Patient Name:  Gina Lowe MRN: [id number]  DOB: 03/28/1946 Age / Sex: 69 y.o., female   PCP: No primary care provider on file.         Medical Service: Internal Medicine Teaching Service         Attending Physician: Dr. Aletta EdouardShilpa Bhardwaj, MD    First Contact: Dr. Loma NewtonNgo Pager: [phone]  Second Contact: Dr. Delane GingerGill Pager: [phone]       After Hours (After 5p/  First Contact Pager: [phone]  weekends / holidays): Second Contact Pager: [phone]   Chief Complaint: Fall and Altered Mental Status   History of Present Illness:   Patient is a 69 year old female with a history of recurrent syncope with falls, asymptomatic bradycardia, chronic back pain, hypertension, fibromyalgia, depression, anxiety who presents to the hospital for syncope with a fall. Patient states that her tenant who usually takes care of her found her down on the floor day of presentation. She states that she does not remember what precipitated her being on the floor and passing out. She states that she does not remember a prodrome. She denies any bowel or bladder incontinence with the episode. She denies any significant confusion after waking up for any significant amount of time. She denies any history of seizures. She states that over the last several weeks, she has not been eating or drinking very well. Her tenant usually brings and prepares all of her meals every day, and she has been doing so regularly over last several weeks. However, patient states that she has has not had as much of an appetite and finds herself mostly just sitting at home and watching television throughout most of the day. She denies any changes in her mood and states that she is in a good mood overall. She feels that she is receiving good care from her tenant. She states that she sometimes feels dizzy when she stands up from a laying position. Family upon interview with the emergency room provider stated that patient has  been feeling generally weak since the day before presentation and that patient had fallen twice since yesterday due to weakness.   Patient reports some feelings of hot and cold intolerance. Otherwise, patient denies any fevers, chills, nausea, vomiting, chest pain, dyspnea, abdominal pain, constipation, diarrhea, dysuria, or hematuria.  Patient recently admitted in December 2015 for similar symptoms with syncope and a fall. At that time, patient was fluid resuscitated and treated for a urinary tract infection with improvement over several days.   Meds:  (Not in a hospital admission) Current Facility-Administered Medications  Medication Dose Route Frequency Provider Last Rate Last Dose  . 0.9 %  sodium chloride infusion   Intravenous Continuous Samuel JesterKathleen McManus, DO 75 mL/hr at 01/14/15 1246    . 0.9 %  sodium chloride infusion   Intravenous STAT Samuel JesterKathleen McManus, DO       Current Outpatient Prescriptions  Medication Sig Dispense Refill  . amLODipine (NORVASC) 10 MG tablet Take 10 mg by mouth daily.    Marland Kitchen. aspirin 81 MG chewable tablet Chew 81 mg by mouth daily.    Marland Kitchen. gabapentin (NEURONTIN) 600 MG tablet Take 600 mg by mouth 3 (three) times daily.    Marland Kitchen. ibuprofen (ADVIL,MOTRIN) 200 MG tablet Take 200 mg by mouth every 6 (six) hours as needed for moderate pain.    Marland Kitchen. losartan (COZAAR) 100 MG tablet Take 1 tablet (100 mg  total) by mouth daily. 30 tablet 0  . ondansetron (ZOFRAN ODT) 4 MG disintegrating tablet Take 1 tablet (4 mg total) by mouth every 8 (eight) hours as needed for nausea. 20 tablet 0  . oxymorphone (OPANA ER) 30 MG 12 hr tablet Take 30 mg by mouth every 12 (twelve) hours.    Marland Kitchen PARoxetine (PAXIL) 20 MG tablet Take 20 mg by mouth every morning.    . temazepam (RESTORIL) 30 MG capsule Take 30 mg by mouth at bedtime.    Marland Kitchen oxyCODONE-acetaminophen (PERCOCET) 10-325 MG per tablet Take 1 tablet by mouth 2 (two) times daily as needed for pain.      Past Medical History  Diagnosis Date  .  Hypertension   . Depression   . Chronic back pain   . Recurrent falls   . Anemia   . Osteoarthritis   . IBS (irritable bowel syndrome)   . Fibromyalgia   . Erosive esophagitis   . Diverticulosis   . Carpal tunnel syndrome   . Chronic back pain   . Chronic pain   . Chronic prescription opiate use   . Peripheral neuropathy     Past Surgical History  Procedure Laterality Date  . Joint replacement      Left knee replacement  . Total knee arthroplasty Right 08/2014  . Abdominal hysterectomy    . Cholecystectomy    . Bladder suspension    . Total knee arthroplasty Left   . Lumbar fusion  2007    Dr. Lovell Sheehan     Allergies: Allergies as of 01/14/2015  . (No Known Allergies)    Family History  Problem Relation Age of Onset  . Hypertension Mother   . Stroke Mother   . Heart disease Father     History   Social History  . Marital Status: Married    Spouse Name: N/A  . Number of Children: N/A  . Years of Education: N/A   Occupational History  . Not on file.   Social History Main Topics  . Smoking status: Never Smoker   . Smokeless tobacco: Not on file  . Alcohol Use: No  . Drug Use: No  . Sexual Activity: Not on file   Other Topics Concern  . Not on file   Social History Narrative     Review of Systems: All pertinent ROS as stated in HPI.   Physical Exam: Blood pressure 120/67, pulse 65, temperature 97.9 F (36.6 C), temperature source Oral, resp. rate 16, SpO2 98 %. General: resting in bed HEENT: PERRL, EOMI, no scleral icterus, mild abrasions throughout forehead  Cardiac: RRR, no rubs, murmurs or gallops Pulm: clear to auscultation bilaterally, moving normal volumes of air Abd: soft, nontender, nondistended, BS present Ext: warm and well perfused, no pedal edema Neuro: alert and oriented X3, cranial nerves II-XII grossly intact Skin: no rashes or lesions noted Psych: appropriate affect  Lab results: Basic Metabolic Panel:  Recent Labs   01/14/15 0945  NA 138  K 4.3  CL 103  CO2 24  GLUCOSE 138*  BUN 64*  CREATININE 2.04*  CALCIUM 9.5   Liver Function Tests:  Recent Labs  01/14/15 0945  AST 16  ALT 11  ALKPHOS 80  BILITOT 0.4  PROT 6.9  ALBUMIN 3.6   No results for input(s): LIPASE, AMYLASE in the last 72 hours. No results for input(s): AMMONIA in the last 72 hours. CBC:  Recent Labs  01/14/15 0945  WBC 5.6  NEUTROABS 5.0  HGB  9.3*  HCT 29.7*  MCV 88.1  PLT 240   Cardiac Enzymes:  Recent Labs  01/14/15 0945  CKTOTAL 177  TROPONINI <0.03   BNP: No results for input(s): PROBNP in the last 72 hours. D-Dimer: No results for input(s): DDIMER in the last 72 hours. CBG: No results for input(s): GLUCAP in the last 72 hours. Hemoglobin A1C: No results for input(s): HGBA1C in the last 72 hours. Fasting Lipid Panel: No results for input(s): CHOL, HDL, LDLCALC, TRIG, CHOLHDL, LDLDIRECT in the last 72 hours. Thyroid Function Tests: No results for input(s): TSH, T4TOTAL, FREET4, T3FREE, THYROIDAB in the last 72 hours. Anemia Panel: No results for input(s): VITAMINB12, FOLATE, FERRITIN, TIBC, IRON, RETICCTPCT in the last 72 hours. Coagulation: No results for input(s): LABPROT, INR in the last 72 hours. Urine Drug Screen: Drugs of Abuse  No results found for: LABOPIA, COCAINSCRNUR, LABBENZ, AMPHETMU, THCU, LABBARB  Alcohol Level: No results for input(s): ETH in the last 72 hours. Urinalysis:  Recent Labs  01/14/15 0929  COLORURINE YELLOW  LABSPEC 1.016  PHURINE 5.0  GLUCOSEU NEGATIVE  HGBUR NEGATIVE  BILIRUBINUR NEGATIVE  KETONESUR NEGATIVE  PROTEINUR NEGATIVE  UROBILINOGEN 0.2  NITRITE NEGATIVE  LEUKOCYTESUR NEGATIVE   Imaging results:  Dg Chest 2 View  01/14/2015   CLINICAL DATA:  Altered mental status.  Unwitnessed fall.  EXAM: CHEST - 2 VIEW  COMPARISON:  Two-view chest x-ray 07/04/2012  FINDINGS: The heart size is normal. A moderate-sized hiatal hernia is present. The lungs  are clear. A spinal cord stimulator extends to the T5-6 level. Atherosclerotic changes are noted in the aortic arch. The visualized soft tissues and bony thorax are unremarkable.  IMPRESSION: 1. No acute cardiopulmonary disease. 2. Atherosclerosis. 3. Moderate-sized hiatal hernia.   Electronically Signed   By: Marin Roberts M.D.   On: 01/14/2015 11:02   Ct Head Wo Contrast  01/14/2015   CLINICAL DATA:  Unwitnessed fall. The patient does not recall the fall. Unknown loss of consciousness.  EXAM: CT HEAD WITHOUT CONTRAST  CT CERVICAL SPINE WITHOUT CONTRAST  TECHNIQUE: Multidetector CT imaging of the head and cervical spine was performed following the standard protocol without intravenous contrast. Multiplanar CT image reconstructions of the cervical spine were also generated.  COMPARISON:  None.  FINDINGS: CT HEAD FINDINGS  No acute infarct, hemorrhage, or mass lesion is present. Mild atrophy is within normal limits for age. Insert pass ventricles insert pass fluid  Soft tissue swelling is present over the right parietal and occipital scalp without an underlying fracture. The calvarium is intact.  The paranasal sinuses and mastoid air cells are clear. Atherosclerotic calcifications are present within the cavernous internal carotid arteries and at the dural margin of the vertebral arteries.  CT CERVICAL SPINE FINDINGS  Cervical spine is imaged from the skullbase through T2-3. Endplate degenerative changes and uncovertebral spurring are evident at C5-6 and C6-7. Degenerative anterolisthesis is noted at C4-5. No acute fracture or traumatic subluxation is present. Atherosclerotic calcifications are present within the cavernous internal carotid arteries but laterally. The lung apices are clear. There is scarring at the right lung apex. The left apex is clear.  IMPRESSION: 1. Soft swelling over the right parietal and occipital scalp without an underlying fracture. 2. Normal CT appearance the brain. 3. Moderate  degenerative changes of the cervical spine without evidence for acute fracture or traumatic subluxation.   Electronically Signed   By: Marin Roberts M.D.   On: 01/14/2015 10:58   Ct Cervical Spine Wo Contrast  01/14/2015   CLINICAL DATA:  Unwitnessed fall. The patient does not recall the fall. Unknown loss of consciousness.  EXAM: CT HEAD WITHOUT CONTRAST  CT CERVICAL SPINE WITHOUT CONTRAST  TECHNIQUE: Multidetector CT imaging of the head and cervical spine was performed following the standard protocol without intravenous contrast. Multiplanar CT image reconstructions of the cervical spine were also generated.  COMPARISON:  None.  FINDINGS: CT HEAD FINDINGS  No acute infarct, hemorrhage, or mass lesion is present. Mild atrophy is within normal limits for age. Insert pass ventricles insert pass fluid  Soft tissue swelling is present over the right parietal and occipital scalp without an underlying fracture. The calvarium is intact.  The paranasal sinuses and mastoid air cells are clear. Atherosclerotic calcifications are present within the cavernous internal carotid arteries and at the dural margin of the vertebral arteries.  CT CERVICAL SPINE FINDINGS  Cervical spine is imaged from the skullbase through T2-3. Endplate degenerative changes and uncovertebral spurring are evident at C5-6 and C6-7. Degenerative anterolisthesis is noted at C4-5. No acute fracture or traumatic subluxation is present. Atherosclerotic calcifications are present within the cavernous internal carotid arteries but laterally. The lung apices are clear. There is scarring at the right lung apex. The left apex is clear.  IMPRESSION: 1. Soft swelling over the right parietal and occipital scalp without an underlying fracture. 2. Normal CT appearance the brain. 3. Moderate degenerative changes of the cervical spine without evidence for acute fracture or traumatic subluxation.   Electronically Signed   By: Marin Roberts M.D.   On:  01/14/2015 10:58    Other results: EKG Interpretation  Date/Time:  Tuesday January 14 2015 10:39:37 EDT Ventricular Rate:  71 PR Interval:  121 QRS Duration: 93 QT Interval:  437 QTC Calculation: 475 R Axis:   -7 Text Interpretation:  Sinus rhythm Left ventricular hypertrophy Tall R wave in V2, consider RVH or PMI Nonspecific T wave abnormality Artifact Since last tracing of earlier today No significant change was found Confirmed by [facility]  MD, Nicholos Johns [phone]) on 01/14/2015 10:46:46 AM   Assessment & Plan by Problem: Active Problems:   Acute renal failure  Patient is a 69 year old female with a history of recurrent syncope with falls, asymptomatic bradycardia, chronic back pain, hypertension, fibromyalgia, depression, anxiety who is admitted for syncope with a fall.  Syncope status post fall: There is no evidence of injury from a fall secondary to syncope as the CT head and cervical spine were unremarkable for intracranial pathology or fracture/subluxation. It is reassuring that her imaging studies were only remarkable for soft tissue swelling. Syncope seems to be a recurrent issue for the patient as she was last hospitalized in December 2015 for the same symptoms. At that time, it was thought to be secondary to hypovolemia. There could also be some contribution from polypharmacy to these episodes. Patient is on oxymorphone in addition to temazepam at home. There does not seem to be any source of infection at this point is contributory as patient's chest x-ray and urinalysis were both unremarkable. Patient does not have a history of coronary artery disease and EKG was unremarkable for any signs of ischemia. Patient's troponins negative 1 which makes an ischemic events less likely. However, patient has not yet been worked up for structural heart disease as there are no echocardiograms on file. Additionally, it is noted that patient has been bradycardic upon initial evaluation and there are  records from the chart that patient has been bradycardic in the past, previously  labeled as asymptomatic bradycardia. There is some possibility that she could be experiencing symptomatic bradycardia as a cause of her spells. Patient could also be experiencing orthostatic hypotension given her reports of orthostatic lightheadedness although upon initial evaluation, patient did not have orthostatic hypotension and had not yet received much IV fluid repletion. There is a low suspicion for seizures as patient does not have a history of seizures and there are no typical features such as postictal phase.   -Echocardiogram -Trend troponins -Diet pending bedside nurse swallow screen.  -Lactic acid pending -Urine drug screen pending -Urine culture pending -TSH  Acute kidney injury: Patient's creatinine is elevated to 2.04 from 0.98 prior from December 2015. There is a concurrent rise in BUN as well. This is likely from a prerenal etiology. Patient status post IV fluid hydration at 75 mL/h in the emergency department. -Continue 75 mL per hour normal saline infusion. -Hold home losartan.  Bradycardia: She was noted to have sinus bradycardia on previous admissions. Patient is not on any nodal blocking agents. Patient doesn't have any other systemic conditions associated with sinus bradycardia that we know of such as infection. Currently working up for hypothyroidism. -Avoid AV nodal blocking agents. -TSH  Chronic back pain: At home, patient takes gabapentin 600 mg 3 times a day, oxymorphone 30 mg twice a day, Percocet 10-325 mg twice a day. Patient with a history of spinal stenosis, spondylolisthesis. Patient status post laminectomy at L4 and L5. -Hold home gabapentin. -Hold home oxymorphone in the setting of acute encephalopathy. -Continue Percocet at a lower dosage of 5-325 mg 1-2 tablets every 6 hours when necessary.  Hypertension: At home, patient takes amlodipine 10 mg daily, losartan 100 mg daily.  Patient has been in a normal range upon initial evaluation. There is no sign of orthostatic hypotension. -Hold home amlodipine and losartan  Depression/anxiety: At home, patient takes Paxil 20 mg daily and temazepam 30 mg daily at bedtime. -Continue home Paxil 20 mg daily. -Reduce home temazepam from 30 mg daily to 15 mg daily.  Diet: Nothing by mouth pending further recovery of encephalopathy Prophylaxis: Heparin 5000 units 3 times a day Code: Full  Dispo: Disposition is deferred at this time, awaiting improvement of current medical problems. Anticipated discharge in approximately 2 day(s).   The patient does have a current PCP (No primary care provider on file.) and does need an [facility] hospital follow-up appointment after discharge.  The patient does not have transportation limitations that hinder transportation to clinic appointments.  Signed: Harold Barban, M.D., Ph.D. Internal Medicine Teaching Service, PGY-1 01/14/2015, 2:33 PM

## 2015-01-14 NOTE — ED Notes (Signed)
PATIENT STATES THAT SHE DOES NOT REMEMBER FALLING. STATES SHE DOES NOT REMEMBER GOING TO BED LAST NIGHT. STATES SHE DID NOT HAVE HER WALKER WITH HER IN THE KITCHEN THIS MORNING WHEN SHE WAS FOUND BY HER NEIGHBOR LYING IN THE KITCHEN FLOOR. STATES THE NEIGHBOR COMES OVER IN THE MORNINGS TO CHECK ON HER AND MAKE HER BREAKFAST. STATES SHE HAS USED  HER WALKER TO AMBULATE SINCE LAST YEAR WHEN SHE HAD HER BACK SURGERY. STATES SHE FEELS SAFER USING HER WALKER. SHE DENIES HAVING ANY HEADACHES, DENIES BEING ILL FOR THE PAST FEW DAYS. DENIES HAVING MEMORY PROBLEMS. SHE CONTINUES TO BE UNSURE IF SHE PASSED OUT BEFORE FALLING OR AFTER SHE FELL. .Marland Kitchen

## 2015-01-14 NOTE — ED Notes (Addendum)
Pt presents to department via [facility] from home for evaluation of unwitnessed fall. Pt lives at home alone, unable to recall how she fell this morning. Unknown LOC. Upon arrival pt able to answer most questions correctly. c-collar in place. Denies pain. No obvious deformities noted from fall. Pt uses walker at home.

## 2015-01-14 NOTE — ED Notes (Signed)
Pt's being transported to ct and then xray.

## 2015-01-14 NOTE — ED Notes (Signed)
STILL WAITING FOR DR GILL TO RETURN CALL.

## 2015-01-14 NOTE — ED Provider Notes (Signed)
CSN: [id number]     Arrival date & time 01/14/15  4098 History   First MD Initiated Contact with Patient 01/14/15 [phone]     Chief Complaint  Patient presents with  . Fall  . Altered Mental Status      Patient is a 69 y.o. female presenting with fall and altered mental status. The history is provided by a relative, the patient and the EMS personnel. The history is limited by the condition of the patient (AMS).  Fall  Altered Mental Status Pt was seen at 0915. Per EMS, pt's family, and pt: Pt's family states pt has had generalized weakness since yesterday. Pt fell twice yesterday due to her weakness. Pt's family states "the person that checks in on her" came to pt's house this morning and found pt on the floor. Pt unable to recall when she fell, why she fell, or for how long she was laying on the floor. Pt lives home alone. Pt currently denies any specific complaints and has removed the c-collar placed on her by EMS shortly after her arrival to the ED. Family and pt deny recent fevers, no cough, no CP/SOB, no abd pain, no N/V/D, no focal motor weakness.   Past Medical History  Diagnosis Date  . Hypertension   . Depression   . Chronic back pain   . Recurrent falls   . Anemia   . Osteoarthritis   . IBS (irritable bowel syndrome)   . Fibromyalgia   . Erosive esophagitis   . Diverticulosis   . Carpal tunnel syndrome   . Chronic back pain   . Chronic pain   . Chronic prescription opiate use   . Peripheral neuropathy    Past Surgical History  Procedure Laterality Date  . Joint replacement      Left knee replacement  . Total knee arthroplasty Right 08/2014  . Abdominal hysterectomy    . Cholecystectomy    . Bladder suspension    . Total knee arthroplasty Left   . Lumbar fusion  2007    Dr. Lovell Sheehan   Family History  Problem Relation Age of Onset  . Hypertension Mother   . Stroke Mother   . Heart disease Father    History  Substance Use Topics  . Smoking status: Never Smoker    . Smokeless tobacco: Not on file  . Alcohol Use: No    Review of Systems  Unable to perform ROS: Mental status change      Allergies  Review of patient's allergies indicates no known allergies.  Home Medications   Prior to Admission medications   Medication Sig Start Date End Date Taking? Authorizing Provider  amLODipine (NORVASC) 10 MG tablet Take 10 mg by mouth daily.    Historical Provider, MD  aspirin 81 MG chewable tablet Chew 81 mg by mouth daily.    Historical Provider, MD  gabapentin (NEURONTIN) 600 MG tablet Take 600 mg by mouth 3 (three) times daily.    Historical Provider, MD  losartan (COZAAR) 100 MG tablet Take 1 tablet (100 mg total) by mouth daily. 02/09/13   Renae Fickle, MD  omeprazole (PRILOSEC OTC) 20 MG tablet Take 20 mg by mouth daily.    Historical Provider, MD  ondansetron (ZOFRAN ODT) 4 MG disintegrating tablet Take 1 tablet (4 mg total) by mouth every 8 (eight) hours as needed for nausea. 02/10/13   Renae Fickle, MD  oxyCODONE-acetaminophen (PERCOCET) 10-325 MG per tablet Take 1 tablet by mouth 2 (two) times daily as  needed for pain.    Historical Provider, MD  oxymorphone (OPANA ER) 30 MG 12 hr tablet Take 30 mg by mouth every 12 (twelve) hours.    Historical Provider, MD  PARoxetine (PAXIL) 20 MG tablet Take 20 mg by mouth every morning.    Historical Provider, MD  temazepam (RESTORIL) 30 MG capsule Take 30 mg by mouth at bedtime.    Historical Provider, MD   BP 125/78 mmHg  Pulse 85  Temp(Src) 97.9 F (36.6 C) (Oral)  Resp 18  SpO2 95% Physical Exam  0920: Physical examination:  Nursing notes reviewed; Vital signs and O2 SAT reviewed;  Constitutional: Well developed, Well nourished, In no acute distress; Head:  Normocephalic, atraumatic; Eyes: EOMI, PERRL, No scleral icterus; ENMT: Mouth and pharynx normal, Mucous membranes dry; Neck: Supple, Full range of motion, No lymphadenopathy; Cardiovascular: Regular rate and rhythm, No gallop;  Respiratory: Breath sounds clear & equal bilaterally, No wheezes.  Speaking full sentences with ease, Normal respiratory effort/excursion; Chest: Nontender, Movement normal; Abdomen: Soft, Nontender, Nondistended, Normal bowel sounds; Genitourinary: No CVA tenderness; Extremities: Pulses normal, No tenderness, No edema, No calf edema or asymmetry.; Neuro: Awake, alert, confused re: events. Major CN grossly intact. No facial droop. Speech clear. Moves all extremities spontaneously and to command without apparent gross focal motor deficits.; Skin: Color normal, warm, Dry.   ED Course  Procedures      EKG Interpretation   Date/Time:  Tuesday January 14 2015 09:08:04 EDT Ventricular Rate:  83 PR Interval:  157 QRS Duration: 94 QT Interval:  395 QTC Calculation: 464 R Axis:   3 Text Interpretation:  Sinus rhythm Nonspecific T wave abnormality Baseline  wander Artifact When compared with ECG of 11/04/2005 Baseline wander and   Artifact are now Present Confirmed by [facility]  MD, Nicholos Johns [phone]) on  01/14/2015 9:43:15 AM      EKG Interpretation  Date/Time:  Tuesday January 14 2015 10:39:37 EDT Ventricular Rate:  71 PR Interval:  121 QRS Duration: 93 QT Interval:  437 QTC Calculation: 475 R Axis:   -7 Text Interpretation:  Sinus rhythm Left ventricular hypertrophy Tall R wave in V2, consider RVH or PMI Nonspecific T wave abnormality Artifact Since last tracing of earlier today No significant change was found Confirmed by [facility]  MD, Nicholos Johns [phone]) on 01/14/2015 10:46:46 AM        MDM  MDM Reviewed: previous chart, nursing note and vitals Reviewed previous: labs and ECG Interpretation: labs, ECG, x-ray and CT scan     Results for orders placed or performed during the hospital encounter of 01/14/15  Urinalysis, Routine w reflex microscopic  Result Value Ref Range   Color, Urine YELLOW YELLOW   APPearance CLEAR CLEAR   Specific Gravity, Urine 1.016 1.005 - 1.030   pH 5.0 5.0 -  8.0   Glucose, UA NEGATIVE NEGATIVE mg/dL   Hgb urine dipstick NEGATIVE NEGATIVE   Bilirubin Urine NEGATIVE NEGATIVE   Ketones, ur NEGATIVE NEGATIVE mg/dL   Protein, ur NEGATIVE NEGATIVE mg/dL   Urobilinogen, UA 0.2 0.0 - 1.0 mg/dL   Nitrite NEGATIVE NEGATIVE   Leukocytes, UA NEGATIVE NEGATIVE  Comprehensive metabolic panel  Result Value Ref Range   Sodium 138 135 - 145 mmol/L   Potassium 4.3 3.5 - 5.1 mmol/L   Chloride 103 96 - 112 mmol/L   CO2 24 19 - 32 mmol/L   Glucose, Bld 138 (H) 70 - 99 mg/dL   BUN 64 (H) 6 - 23 mg/dL   Creatinine,  Ser 2.04 (H) 0.50 - 1.10 mg/dL   Calcium 9.5 8.4 - 16.1 mg/dL   Total Protein 6.9 6.0 - 8.3 g/dL   Albumin 3.6 3.5 - 5.2 g/dL   AST 16 0 - 37 U/L   ALT 11 0 - 35 U/L   Alkaline Phosphatase 80 39 - 117 U/L   Total Bilirubin 0.4 0.3 - 1.2 mg/dL   GFR calc non Af Amer 24 (L) >90 mL/min   GFR calc Af Amer 27 (L) >90 mL/min   Anion gap 11 5 - 15  Troponin I  Result Value Ref Range   Troponin I <0.03 <0.031 ng/mL  CBC with Differential  Result Value Ref Range   WBC 5.6 4.0 - 10.5 K/uL   RBC 3.37 (L) 3.87 - 5.11 MIL/uL   Hemoglobin 9.3 (L) 12.0 - 15.0 g/dL   HCT 09.6 (L) 04.5 - 40.9 %   MCV 88.1 78.0 - 100.0 fL   MCH 27.6 26.0 - 34.0 pg   MCHC 31.3 30.0 - 36.0 g/dL   RDW 81.1 91.4 - 78.2 %   Platelets 240 150 - 400 K/uL   Neutrophils Relative % 89 (H) 43 - 77 %   Neutro Abs 5.0 1.7 - 7.7 K/uL   Lymphocytes Relative 8 (L) 12 - 46 %   Lymphs Abs 0.5 (L) 0.7 - 4.0 K/uL   Monocytes Relative 3 3 - 12 %   Monocytes Absolute 0.2 0.1 - 1.0 K/uL   Eosinophils Relative 0 0 - 5 %   Eosinophils Absolute 0.0 0.0 - 0.7 K/uL   Basophils Relative 0 0 - 1 %   Basophils Absolute 0.0 0.0 - 0.1 K/uL  Lactic acid, plasma  Result Value Ref Range   Lactic Acid, Venous 1.1 0.5 - 2.0 mmol/L  CK  Result Value Ref Range   Total CK 177 7 - 177 U/L   Dg Chest 2 View 01/14/2015   CLINICAL DATA:  Altered mental status.  Unwitnessed fall.  EXAM: CHEST - 2 VIEW   COMPARISON:  Two-view chest x-ray 07/04/2012  FINDINGS: The heart size is normal. A moderate-sized hiatal hernia is present. The lungs are clear. A spinal cord stimulator extends to the T5-6 level. Atherosclerotic changes are noted in the aortic arch. The visualized soft tissues and bony thorax are unremarkable.  IMPRESSION: 1. No acute cardiopulmonary disease. 2. Atherosclerosis. 3. Moderate-sized hiatal hernia.   Electronically Signed   By: Marin Roberts M.D.   On: 01/14/2015 11:02   Ct Head Wo Contrast 01/14/2015   CLINICAL DATA:  Unwitnessed fall. The patient does not recall the fall. Unknown loss of consciousness.  EXAM: CT HEAD WITHOUT CONTRAST  CT CERVICAL SPINE WITHOUT CONTRAST  TECHNIQUE: Multidetector CT imaging of the head and cervical spine was performed following the standard protocol without intravenous contrast. Multiplanar CT image reconstructions of the cervical spine were also generated.  COMPARISON:  None.  FINDINGS: CT HEAD FINDINGS  No acute infarct, hemorrhage, or mass lesion is present. Mild atrophy is within normal limits for age. Insert pass ventricles insert pass fluid  Soft tissue swelling is present over the right parietal and occipital scalp without an underlying fracture. The calvarium is intact.  The paranasal sinuses and mastoid air cells are clear. Atherosclerotic calcifications are present within the cavernous internal carotid arteries and at the dural margin of the vertebral arteries.  CT CERVICAL SPINE FINDINGS  Cervical spine is imaged from the skullbase through T2-3. Endplate degenerative changes and uncovertebral spurring are  evident at C5-6 and C6-7. Degenerative anterolisthesis is noted at C4-5. No acute fracture or traumatic subluxation is present. Atherosclerotic calcifications are present within the cavernous internal carotid arteries but laterally. The lung apices are clear. There is scarring at the right lung apex. The left apex is clear.  IMPRESSION: 1. Soft  swelling over the right parietal and occipital scalp without an underlying fracture. 2. Normal CT appearance the brain. 3. Moderate degenerative changes of the cervical spine without evidence for acute fracture or traumatic subluxation.   Electronically Signed   By: Marin Robertshristopher  Mattern M.D.   On: 01/14/2015 10:58   Ct Cervical Spine Wo Contrast 01/14/2015   CLINICAL DATA:  Unwitnessed fall. The patient does not recall the fall. Unknown loss of consciousness.  EXAM: CT HEAD WITHOUT CONTRAST  CT CERVICAL SPINE WITHOUT CONTRAST  TECHNIQUE: Multidetector CT imaging of the head and cervical spine was performed following the standard protocol without intravenous contrast. Multiplanar CT image reconstructions of the cervical spine were also generated.  COMPARISON:  None.  FINDINGS: CT HEAD FINDINGS  No acute infarct, hemorrhage, or mass lesion is present. Mild atrophy is within normal limits for age. Insert pass ventricles insert pass fluid  Soft tissue swelling is present over the right parietal and occipital scalp without an underlying fracture. The calvarium is intact.  The paranasal sinuses and mastoid air cells are clear. Atherosclerotic calcifications are present within the cavernous internal carotid arteries and at the dural margin of the vertebral arteries.  CT CERVICAL SPINE FINDINGS  Cervical spine is imaged from the skullbase through T2-3. Endplate degenerative changes and uncovertebral spurring are evident at C5-6 and C6-7. Degenerative anterolisthesis is noted at C4-5. No acute fracture or traumatic subluxation is present. Atherosclerotic calcifications are present within the cavernous internal carotid arteries but laterally. The lung apices are clear. There is scarring at the right lung apex. The left apex is clear.  IMPRESSION: 1. Soft swelling over the right parietal and occipital scalp without an underlying fracture. 2. Normal CT appearance the brain. 3. Moderate degenerative changes of the cervical  spine without evidence for acute fracture or traumatic subluxation.   Electronically Signed   By: Marin Robertshristopher  Mattern M.D.   On: 01/14/2015 10:58   Results for Jim DesanctisBLACK, Gina L (MRN [id number]) as of 01/14/2015 11:52  Ref. Range 09/09/2014 19:10 09/10/2014 04:12 09/11/2014 05:37 01/14/2015 09:45  BUN Latest Ref Range: 6-23 mg/dL 31 (H) 25 (H) 15 64 (H)  Creatinine Latest Ref Range: 0.50-1.10 mg/dL 4.091.59 (H) 8.111.26 9.140.98 7.822.04 (H)   1125:  BUN/Cr elevated from previous; will continue judicious IVF. Dx and testing d/w pt and family.  Questions answered.  Verb understanding, agreeable to admit. [facility], case discussed, including:  HPI, pertinent PM/SHx, VS/PE, dx testing, ED course and treatment:  Agreeable to admit, requests to write temporary orders, obtain tele bed to Dr. Dorris SinghBhardwaj's service.   Samuel JesterKathleen Maxxon Schwanke, DO 01/17/15 2121

## 2015-01-14 NOTE — ED Notes (Signed)
Report attempted nurse to call back.  

## 2015-01-14 NOTE — ED Notes (Signed)
Dr gill has arrived in ed and is seeing patient now

## 2015-01-14 NOTE — ED Notes (Signed)
Pt in x-ray and CT at the time.

## 2015-01-14 NOTE — ED Notes (Signed)
MD at bedside. 

## 2015-01-14 NOTE — ED Notes (Signed)
PT DAUGHTER IN LAW ANGIE COX  [phone]

## 2015-01-15 DIAGNOSIS — R55 Syncope and collapse: Secondary | ICD-10-CM

## 2015-01-15 DIAGNOSIS — W19XXXA Unspecified fall, initial encounter: Secondary | ICD-10-CM | POA: Insufficient documentation

## 2015-01-15 DIAGNOSIS — I10 Essential (primary) hypertension: Secondary | ICD-10-CM

## 2015-01-15 DIAGNOSIS — D649 Anemia, unspecified: Secondary | ICD-10-CM

## 2015-01-15 DIAGNOSIS — F418 Other specified anxiety disorders: Secondary | ICD-10-CM | POA: Diagnosis not present

## 2015-01-15 DIAGNOSIS — R4182 Altered mental status, unspecified: Secondary | ICD-10-CM | POA: Insufficient documentation

## 2015-01-15 DIAGNOSIS — Y92009 Unspecified place in unspecified non-institutional (private) residence as the place of occurrence of the external cause: Secondary | ICD-10-CM

## 2015-01-15 LAB — BASIC METABOLIC PANEL
ANION GAP: 8 (ref 5–15)
BUN: 52 mg/dL — AB (ref 6–23)
CHLORIDE: 104 mmol/L (ref 96–112)
CO2: 24 mmol/L (ref 19–32)
CREATININE: 1.17 mg/dL — AB (ref 0.50–1.10)
Calcium: 8.9 mg/dL (ref 8.4–10.5)
GFR calc Af Amer: 53 mL/min — ABNORMAL LOW (ref >90)
GFR calc non Af Amer: 46 mL/min — ABNORMAL LOW (ref >90)
Glucose, Bld: 95 mg/dL (ref 70–99)
Potassium: 3.4 mmol/L — ABNORMAL LOW (ref 3.5–5.1)
Sodium: 136 mmol/L (ref 135–145)

## 2015-01-15 LAB — URINE CULTURE: Colony Count: >=100000

## 2015-01-15 LAB — CBC
HCT: 28.3 % — ABNORMAL LOW (ref 36.0–46.0)
Hemoglobin: 8.9 g/dL — ABNORMAL LOW (ref 12.0–15.0)
MCH: 27.3 pg (ref 26.0–34.0)
MCHC: 31.4 g/dL (ref 30.0–36.0)
MCV: 86.8 fL (ref 78.0–100.0)
PLATELETS: 221 10*3/uL (ref 150–400)
RBC: 3.26 MIL/uL — AB (ref 3.87–5.11)
RDW: 14.8 % (ref 11.5–15.5)
WBC: 4.2 10*3/uL (ref 4.0–10.5)

## 2015-01-15 LAB — TROPONIN I: Troponin I: <0.03 ng/mL (ref <0.031)

## 2015-01-15 MED ORDER — POTASSIUM CHLORIDE CRYS ER 20 MEQ PO TBCR
40.0000 meq | EXTENDED_RELEASE_TABLET | Freq: Once | ORAL | Status: AC
  Administered 2015-01-15: 40 meq via ORAL
  Filled 2015-01-15: qty 2

## 2015-01-15 NOTE — Progress Notes (Signed)
  Echocardiogram 2D Echocardiogram has been performed.  Gina Lowe, Gina Lowe A 01/15/2015, 11:45 AM

## 2015-01-15 NOTE — Progress Notes (Signed)
Nutrition Brief Note  Patient identified on the Malnutrition Screening Tool (MST) Report  Wt Readings from Last 15 Encounters:  01/14/15 145 lb 4.5 oz (65.9 kg)   Patient is a 69 year old female with a history of recurrent syncope with falls, asymptomatic bradycardia, chronic back pain, hypertension, fibromyalgia, depression, anxiety who presents to the hospital for syncope with a fall. Patient states that her tenant who usually takes care of her found her down on the floor day of presentation. She states that she does not remember what precipitated her being on the floor and passing out  Pt reports decreased appetite PTA, however, it has returned. She revealed that she forced herself to eat PTA because she "knew I had to get myself to eat to keep going". She has strong support from her family and neighbors who prepare meals for her. She consumed 100% of her breakfast tray this morning. Pt did not consume her lunch tray, reporting she consumed her entire breakfast and multiple snacks throughout the morning and does not feel hungry to eat lunch currently.   She shares that she has lost about 50# over the past year, however,this was intentional. She has recently become more active, doing gardening work.   Body mass index is 30.37 kg/(m^2). Patient meets criteria for obesity, class I based on current BMI.   Current diet order is regular, patient is consuming approximately 100% of meals at this time. Labs and medications reviewed.   No nutrition interventions warranted at this time. If nutrition issues arise, please consult RD.   Garrit Marrow A. Mayford KnifeWilliams, RD, LDN, CDE Pager: [phone] After hours Pager: [phone]

## 2015-01-15 NOTE — Evaluation (Signed)
Occupational Therapy Evaluation and Discharge Patient Details Name: Gina DesanctisGeraldine L Lowe MRN: [id number] DOB: 1945-11-14 Today's Date: 01/15/2015    History of Present Illness Patient is a 69 year old female with a history of recurrent syncope with falls, asymptomatic bradycardia, chronic back pain, hypertension, fibromyalgia, depression, anxiety who presents to the hospital for syncope with a fall.    Clinical Impression   PTA pt lived at home and was independent with use of SPC or RW for ADLs and functional mobility. Pt has assist for IADLs. Pt currently at Supervision level for ADLs and functional mobility and is progressing towards baseline. No further acute OT needs.     Follow Up Recommendations  No OT follow up;Supervision - Intermittent    Equipment Recommendations  None recommended by OT    Recommendations for Other Services       Precautions / Restrictions Precautions Precautions: Fall Precaution Comments: syncopal Restrictions Weight Bearing Restrictions: No      Mobility Bed Mobility Overal bed mobility: Modified Independent                Transfers Overall transfer level: Needs assistance Equipment used: Rolling walker (2 wheeled) Transfers: Sit to/from Stand Sit to Stand: Supervision         General transfer comment: Supervision for safety. Good hand placement. Denies dizziness when sitting up or standing.         ADL Overall ADL's : Needs assistance/impaired                                       General ADL Comments: Supervision level for ADLs and functional mobility. Pt able to reach Bil feet for LB ADLs in sitting using figure four method. Pt ambulated to sink to wash her hands and returned to bed with use of RW.      Vision Additional Comments: No change from baseline          Pertinent Vitals/Pain Pain Assessment: No/denies pain     Hand Dominance Right   Extremity/Trunk Assessment Upper Extremity  Assessment Upper Extremity Assessment: Generalized weakness   Lower Extremity Assessment Lower Extremity Assessment: Generalized weakness;Defer to PT evaluation   Cervical / Trunk Assessment Cervical / Trunk Assessment: Normal   Communication Communication Communication: No difficulties   Cognition Arousal/Alertness: Awake/alert Behavior During Therapy: WFL for tasks assessed/performed Overall Cognitive Status: Within Functional Limits for tasks assessed                                Home Living Family/patient expects to be discharged to:: Private residence Living Arrangements: Alone Available Help at Discharge: Family;Other (Comment);Available PRN/intermittently (House tenant "most of the day") Type of Home: Mobile home Home Access: Ramped entrance     Home Layout: One level     Bathroom Shower/Tub: Producer, television/film/videoWalk-in shower   Bathroom Toilet: Standard     Home Equipment: Cane - single point;Wheelchair - [employer] Corporationmanual;Walker - 2 wheels;Bedside commode;Shower seat          Prior Functioning/Environment Level of Independence: Independent with assistive device(s)        Comments: Uses cane/RW at times    OT Diagnosis: Generalized weakness;Acute pain    End of Session Equipment Utilized During Treatment: Engineer, waterolling walker Nurse Communication: Mobility status  Activity Tolerance: Patient tolerated treatment well Patient left: in bed;with call bell/phone within reach;with bed alarm set  Time: 1320-1340 OT Time Calculation (min): 20 min Charges:  OT General Charges $OT Visit: 1 Procedure OT Evaluation $Initial OT Evaluation Tier I: 1 Procedure G-Codes: OT G-codes **NOT FOR INPATIENT CLASS** Functional Assessment Tool Used: clinical judgement Functional Limitation: Self care Self Care Current Status (Z6109): At least 1 percent but less than 20 percent impaired, limited or restricted Self Care Goal Status (U0454): At least 1 percent but less than 20 percent impaired,  limited or restricted Self Care Discharge Status [phone]): At least 1 percent but less than 20 percent impaired, limited or restricted  Rae Lips 01/15/2015, 1:45 PM  Carney Living, OTR/L Occupational Therapist [phone] (pager)

## 2015-01-15 NOTE — Discharge Summary (Signed)
Name: Gina Lowe MRN: [id number] DOB: 13-Jun-1946 69 y.o. PCP: Lonie Peak, PA-C  Date of Admission: 01/14/2015  8:56 AM Date of Discharge: 01/15/2015 Attending Physician: Aletta Edouard, MD  Discharge Diagnosis: Principal Problem:   Syncope Active Problems:   Acute kidney injury   Altered mental status   Fall at home   Faintness  Discharge Medications:   Medication List    STOP taking these medications        losartan 100 MG tablet  Commonly known as:  COZAAR      TAKE these medications        amLODipine 10 MG tablet  Commonly known as:  NORVASC  Take 10 mg by mouth daily.     aspirin 81 MG chewable tablet  Chew 81 mg by mouth daily.     gabapentin 600 MG tablet  Commonly known as:  NEURONTIN  Take 600 mg by mouth 3 (three) times daily.     ibuprofen 200 MG tablet  Commonly known as:  ADVIL,MOTRIN  Take 200 mg by mouth every 6 (six) hours as needed for moderate pain.     ondansetron 4 MG disintegrating tablet  Commonly known as:  ZOFRAN ODT  Take 1 tablet (4 mg total) by mouth every 8 (eight) hours as needed for nausea.     oxymorphone 30 MG 12 hr tablet  Commonly known as:  OPANA ER  Take 30 mg by mouth every 12 (twelve) hours.     PARoxetine 20 MG tablet  Commonly known as:  PAXIL  Take 20 mg by mouth every morning.     temazepam 30 MG capsule  Commonly known as:  RESTORIL  Take 30 mg by mouth at bedtime.        Disposition and follow-up:   [name] was discharged from [facility] in Stable condition.  At the hospital follow up visit please address:  Syncope status post fall: Recommend further follow-up with cardiology as an outpatient for further workup of pulmonary hypertension and evaluation for possible symptomatic bradycardia. Would also consider down titration of pain and anxiety medications should her symptoms persist.  Possible hyperthyroidism versus sick euthyroid: Would consider follow-up TSH  levels in 8 weeks and further workup as necessary.  Normocytic anemia: Would clarify as an outpatient iron supplementation regimen with outpatient physician and previous workup for normocytic anemia.  Hypertension:  Would consider resuming losartan as an outpatient given stabilization of creatinine.  Depression/anxiety: Would consider reduction of patient's temazepam as an outpatient if she were to have persistent symptoms.   2.  Labs / imaging needed at time of follow-up: none, TSH at subsequent appointment  3.  Pending labs/ test needing follow-up: none  Follow-up Appointments:     Follow-up Information    Follow up with CONROY,NATHAN, PA-C.   Specialty:  Physician Assistant   Why:  Please follow up as scheduled on May 4   Contact information:   [facility] [address] [place] [address] [phone]       Follow up with Donato Schultz, MD.   Specialty:  Cardiology   Why:  June 3 at 11:00 AM   Contact information:   [address] [place] [address] [phone]       Discharge Instructions: Discharge Instructions    Call MD for:  difficulty breathing, headache or visual disturbances    Complete by:  As directed      Call MD for:  extreme fatigue    Complete by:  As directed      Call MD for:  hives    Complete by:  As directed      Call MD for:  persistant dizziness or light-headedness    Complete by:  As directed      Call MD for:  persistant nausea and vomiting    Complete by:  As directed      Call MD for:  redness, tenderness, or signs of infection (pain, swelling, redness, odor or green/yellow discharge around incision site)    Complete by:  As directed      Call MD for:  severe uncontrolled pain    Complete by:  As directed      Call MD for:  temperature >100.4    Complete by:  As directed      Diet - low sodium heart healthy    Complete by:  As directed      Increase activity slowly    Complete by:  As directed             Consultations:    Procedures Performed:  Dg Chest 2 View  01/14/2015   CLINICAL DATA:  Altered mental status.  Unwitnessed fall.  EXAM: CHEST - 2 VIEW  COMPARISON:  Two-view chest x-ray 07/04/2012  FINDINGS: The heart size is normal. A moderate-sized hiatal hernia is present. The lungs are clear. A spinal cord stimulator extends to the T5-6 level. Atherosclerotic changes are noted in the aortic arch. The visualized soft tissues and bony thorax are unremarkable.  IMPRESSION: 1. No acute cardiopulmonary disease. 2. Atherosclerosis. 3. Moderate-sized hiatal hernia.   Electronically Signed   By: Marin Roberts M.D.   On: 01/14/2015 11:02   Ct Head Wo Contrast  01/14/2015   CLINICAL DATA:  Unwitnessed fall. The patient does not recall the fall. Unknown loss of consciousness.  EXAM: CT HEAD WITHOUT CONTRAST  CT CERVICAL SPINE WITHOUT CONTRAST  TECHNIQUE: Multidetector CT imaging of the head and cervical spine was performed following the standard protocol without intravenous contrast. Multiplanar CT image reconstructions of the cervical spine were also generated.  COMPARISON:  None.  FINDINGS: CT HEAD FINDINGS  No acute infarct, hemorrhage, or mass lesion is present. Mild atrophy is within normal limits for age. Insert pass ventricles insert pass fluid  Soft tissue swelling is present over the right parietal and occipital scalp without an underlying fracture. The calvarium is intact.  The paranasal sinuses and mastoid air cells are clear. Atherosclerotic calcifications are present within the cavernous internal carotid arteries and at the dural margin of the vertebral arteries.  CT CERVICAL SPINE FINDINGS  Cervical spine is imaged from the skullbase through T2-3. Endplate degenerative changes and uncovertebral spurring are evident at C5-6 and C6-7. Degenerative anterolisthesis is noted at C4-5. No acute fracture or traumatic subluxation is present. Atherosclerotic calcifications are present within the  cavernous internal carotid arteries but laterally. The lung apices are clear. There is scarring at the right lung apex. The left apex is clear.  IMPRESSION: 1. Soft swelling over the right parietal and occipital scalp without an underlying fracture. 2. Normal CT appearance the brain. 3. Moderate degenerative changes of the cervical spine without evidence for acute fracture or traumatic subluxation.   Electronically Signed   By: Marin Roberts M.D.   On: 01/14/2015 10:58   Ct Cervical Spine Wo Contrast  01/14/2015   CLINICAL DATA:  Unwitnessed fall. The patient does not recall the fall. Unknown  loss of consciousness.  EXAM: CT HEAD WITHOUT CONTRAST  CT CERVICAL SPINE WITHOUT CONTRAST  TECHNIQUE: Multidetector CT imaging of the head and cervical spine was performed following the standard protocol without intravenous contrast. Multiplanar CT image reconstructions of the cervical spine were also generated.  COMPARISON:  None.  FINDINGS: CT HEAD FINDINGS  No acute infarct, hemorrhage, or mass lesion is present. Mild atrophy is within normal limits for age. Insert pass ventricles insert pass fluid  Soft tissue swelling is present over the right parietal and occipital scalp without an underlying fracture. The calvarium is intact.  The paranasal sinuses and mastoid air cells are clear. Atherosclerotic calcifications are present within the cavernous internal carotid arteries and at the dural margin of the vertebral arteries.  CT CERVICAL SPINE FINDINGS  Cervical spine is imaged from the skullbase through T2-3. Endplate degenerative changes and uncovertebral spurring are evident at C5-6 and C6-7. Degenerative anterolisthesis is noted at C4-5. No acute fracture or traumatic subluxation is present. Atherosclerotic calcifications are present within the cavernous internal carotid arteries but laterally. The lung apices are clear. There is scarring at the right lung apex. The left apex is clear.  IMPRESSION: 1. Soft  swelling over the right parietal and occipital scalp without an underlying fracture. 2. Normal CT appearance the brain. 3. Moderate degenerative changes of the cervical spine without evidence for acute fracture or traumatic subluxation.   Electronically Signed   By: Marin Robertshristopher  Mattern M.D.   On: 01/14/2015 10:58    2D Echo:   Study Conclusions  - Left ventricle: The cavity size was normal. Wall thickness was increased in a pattern of mild LVH. The estimated ejection fraction was 60%. Wall motion was normal; there were no regional wall motion abnormalities. - Aortic valve: Sclerosis without stenosis. There was no significant regurgitation. - Right ventricle: The cavity size was normal. Systolic function was normal. - Pulmonary arteries: PA peak pressure: 38 mm Hg (S).  Cardiac Cath: none  Admission HPI:   Patient is a 69 year old female with a history of recurrent syncope with falls, asymptomatic bradycardia, chronic back pain, hypertension, fibromyalgia, depression, anxiety who presents to the hospital for syncope with a fall. Patient states that her tenant who usually takes care of her found her down on the floor day of presentation. She states that she does not remember what precipitated her being on the floor and passing out. She states that she does not remember a prodrome. She denies any bowel or bladder incontinence with the episode. She denies any significant confusion after waking up for any significant amount of time. She denies any history of seizures. She states that over the last several weeks, she has not been eating or drinking very well. Her tenant usually brings and prepares all of her meals every day, and she has been doing so regularly over last several weeks. However, patient states that she has has not had as much of an appetite and finds herself mostly just sitting at home and watching television throughout most of the day. She denies any changes in her mood and  states that she is in a good mood overall. She feels that she is receiving good care from her tenant. She states that she sometimes feels dizzy when she stands up from a laying position. Family upon interview with the emergency room provider stated that patient has been feeling generally weak since the day before presentation and that patient had fallen twice since yesterday due to weakness.   Patient reports  some feelings of hot and cold intolerance. Otherwise, patient denies any fevers, chills, nausea, vomiting, chest pain, dyspnea, abdominal pain, constipation, diarrhea, dysuria, or hematuria.  Patient recently admitted in December 2015 for similar symptoms with syncope and a fall. At that time, patient was fluid resuscitated and treated for a urinary tract infection with improvement over several days.  Hospital Course by problem list: Principal Problem:   Syncope Active Problems:   Acute kidney injury   Altered mental status   Fall at home   Faintness   Syncope status post fall: There are several possible etiologies. Patient found to be bradycardic throughout her hospitalization but was able to work with physical therapy without any major issues. No evidence noted on telemetry. Patient reported some possible decreased oral intake prior to admission and patient's acute kidney injury resolved during her hospitalization status post IV fluid resuscitation so there could be some component of hypovolemia. Additionally, echocardiogram showing a elevation of the pulmonary artery peak pressure of 38 mmHg, which is equivocal for ruling in or out luminary hypertension. Recommend further follow-up with cardiology as an outpatient for further workup of pulmonary hypertension and evaluation for possible symptomatic bradycardia. Would also consider down titration of pain and anxiety medications should her symptoms persist.  Possible hyperthyroidism versus sick euthyroid: Patient with an depressed TSH of  0.138. Patient not reporting any symptoms of hyperthyroidism. Would consider follow-up TSH levels in 8 weeks and further workup as necessary.  Acute kidney injury: Creatinine resolved status post fluid resuscitation to 1.17 from 2.4 upon admission. Patient's home losartan was discontinued as an inpatient.  Normocytic anemia: Patient's hemoglobin down to 8.9 from 9.3 with an MCV of 86.8. Patient noted previously in the chart to have anemia of chronic disease. Admission from December 2015 showing that patient had ferrous sulfate supplementation although patient was not taking any upon admission this time. No evidence of bleeding at this time. Would clarify as an outpatient iron supplementation regimen with outpatient physician and previous workup for normocytic anemia.  Bradycardia: Patient has bradycardia with rates between 55 and 61. Patient did not experience any symptoms working with physical therapy. Workup for thyroid disease is actually remarkable for evidence suggestive of hyperthyroidism versus sick euthyroid which should not be contributory. Would avoid AV nodal blocking agents  Chronic back pain: Patient was put on a lower dosage of her home Percocet 5-325 mg as an inpatient. She was discharged home with her home regimen of pain medications as listed above.  Hypertension: Patient's home antihypertensives were held in the setting of normotension. Her amlodipine was resumed upon discharge. Her losartan was continued to be held in the setting of acute kidney injury. Would consider resuming losartan as an outpatient given stabilization of creatinine.  Depression/anxiety: Patient was continued on her home regimen of Paxil 20 mg daily and temazepam 30 mg daily as an inpatient. Would consider reduction of patient's temazepam as an outpatient if she were to have persistent symptoms.   Discharge Vitals:   BP 148/79 mmHg  Pulse 70  Temp(Src) 98.5 F (36.9 C) (Oral)  Resp 18  Ht  (1.473 m)   Wt 145 lb 4.5 oz (65.9 kg)  BMI 30.37 kg/m2  SpO2 98%  Discharge Labs:  Results for orders placed or performed during the hospital encounter of 01/14/15 (from the past 24 hour(s))  Troponin I     Status: None   Collection Time: 01/14/15  8:57 PM  Result Value Ref Range   Troponin I <0.03 <  0.031 ng/mL  TSH     Status: Abnormal   Collection Time: 01/14/15  8:57 PM  Result Value Ref Range   TSH 0.138 (L) 0.350 - 4.500 uIU/mL  Troponin I     Status: None   Collection Time: 01/15/15  3:02 AM  Result Value Ref Range   Troponin I <0.03 <0.031 ng/mL  Basic metabolic panel     Status: Abnormal   Collection Time: 01/15/15  3:02 AM  Result Value Ref Range   Sodium 136 135 - 145 mmol/L   Potassium 3.4 (L) 3.5 - 5.1 mmol/L   Chloride 104 96 - 112 mmol/L   CO2 24 19 - 32 mmol/L   Glucose, Bld 95 70 - 99 mg/dL   BUN 52 (H) 6 - 23 mg/dL   Creatinine, Ser 1.61 (H) 0.50 - 1.10 mg/dL   Calcium 8.9 8.4 - 09.6 mg/dL   GFR calc non Af Amer 46 (L) >90 mL/min   GFR calc Af Amer 53 (L) >90 mL/min   Anion gap 8 5 - 15  CBC     Status: Abnormal   Collection Time: 01/15/15  3:02 AM  Result Value Ref Range   WBC 4.2 4.0 - 10.5 K/uL   RBC 3.26 (L) 3.87 - 5.11 MIL/uL   Hemoglobin 8.9 (L) 12.0 - 15.0 g/dL   HCT 04.5 (L) 40.9 - 81.1 %   MCV 86.8 78.0 - 100.0 fL   MCH 27.3 26.0 - 34.0 pg   MCHC 31.4 30.0 - 36.0 g/dL   RDW 91.4 78.2 - 95.6 %   Platelets 221 150 - 400 K/uL    Signed: Harold Barban, MD 01/15/2015, 4:51 PM    Services Ordered on Discharge: none Equipment Ordered on Discharge: none

## 2015-01-15 NOTE — Progress Notes (Signed)
Subjective:  Patient states that she has been doing well overnight. She states that she did well with physical therapy. She states that she is ready to go home. Patient is not reporting any other complaints.  Objective: Vital signs in last 24 hours: Filed Vitals:   01/14/15 1554 01/14/15 1708 01/14/15 2037 01/15/15 0516  BP: 130/70 159/73 121/62 116/72  Pulse: 61 61 61 55  Temp:  97.9 F (36.6 C) 98 F (36.7 C) 97.4 F (36.3 C)  TempSrc:  Oral Oral Oral  Resp: Height:   (1.473 m)    Weight:  145 lb 4.5 oz (65.9 kg)    SpO2: 94% 100% 97% 97%   Weight change:   Intake/Output Summary (Last 24 hours) at 01/15/15 1411 Last data filed at 01/15/15 1026  Gross per 24 hour  Intake   1310 ml  Output   1400 ml  Net    -90 ml    General: resting in bed HEENT: PERRL, EOMI, no scleral icterus, mild abrasions throughout forehead  Cardiac: RRR, no rubs, murmurs or gallops Pulm: clear to auscultation bilaterally, moving normal volumes of air Abd: soft, nontender, nondistended, BS present Ext: warm and well perfused, no pedal edema Neuro: alert and oriented X3, cranial nerves II-XII grossly intact Skin: no rashes or lesions noted Psych: appropriate affect  Lab Results: Basic Metabolic Panel:  Recent Labs Lab 01/14/15 0945 01/14/15 1605 01/15/15 0302  NA 138  --  136  K 4.3  --  3.4*  CL 103  --  104  CO2 24  --  24  GLUCOSE 138*  --  95  BUN 64*  --  52*  CREATININE 2.04*  --  1.17*  CALCIUM 9.5  --  8.9  MG  --  2.4  --   PHOS  --  4.2  --    Liver Function Tests:  Recent Labs Lab 01/14/15 0945  AST 16  ALT 11  ALKPHOS 80  BILITOT 0.4  PROT 6.9  ALBUMIN 3.6   No results for input(s): LIPASE, AMYLASE in the last 168 hours. No results for input(s): AMMONIA in the last 168 hours. CBC:  Recent Labs Lab 01/14/15 0945 01/15/15 0302  WBC 5.6 4.2  NEUTROABS 5.0  --   HGB 9.3* 8.9*  HCT 29.7* 28.3*  MCV 88.1 86.8  PLT 240 221    Cardiac Enzymes:  Recent Labs Lab 01/14/15 0945 01/14/15 1605 01/14/15 2057 01/15/15 0302  CKTOTAL 177  --   --   --   TROPONINI <0.03 <0.03 <0.03 <0.03   BNP: No results for input(s): PROBNP in the last 168 hours. D-Dimer: No results for input(s): DDIMER in the last 168 hours. CBG: No results for input(s): GLUCAP in the last 168 hours. Hemoglobin A1C: No results for input(s): HGBA1C in the last 168 hours. Fasting Lipid Panel: No results for input(s): CHOL, HDL, LDLCALC, TRIG, CHOLHDL, LDLDIRECT in the last 168 hours. Thyroid Function Tests:  Recent Labs Lab 01/14/15 2057  TSH 0.138*   Coagulation: No results for input(s): LABPROT, INR in the last 168 hours. Anemia Panel: No results for input(s): VITAMINB12, FOLATE, FERRITIN, TIBC, IRON, RETICCTPCT in the last 168 hours. Urine Drug Screen: Drugs of Abuse     Component Value Date/Time   LABOPIA POSITIVE* 01/14/2015 0929   COCAINSCRNUR NONE DETECTED 01/14/2015 0929   LABBENZ POSITIVE* 01/14/2015 0929   AMPHETMU NONE DETECTED 01/14/2015 0929   THCU NONE DETECTED 01/14/2015 1610  LABBARB NONE DETECTED 01/14/2015 0929    Alcohol Level: No results for input(s): ETH in the last 168 hours. Urinalysis:  Recent Labs Lab 01/14/15 0929  COLORURINE YELLOW  LABSPEC 1.016  PHURINE 5.0  GLUCOSEU NEGATIVE  HGBUR NEGATIVE  BILIRUBINUR NEGATIVE  KETONESUR NEGATIVE  PROTEINUR NEGATIVE  UROBILINOGEN 0.2  NITRITE NEGATIVE  LEUKOCYTESUR NEGATIVE   Micro Results: No results found for this or any previous visit (from the past 240 hour(s)). Studies/Results: Dg Chest 2 View  01/14/2015   CLINICAL DATA:  Altered mental status.  Unwitnessed fall.  EXAM: CHEST - 2 VIEW  COMPARISON:  Two-view chest x-ray 07/04/2012  FINDINGS: The heart size is normal. A moderate-sized hiatal hernia is present. The lungs are clear. A spinal cord stimulator extends to the T5-6 level. Atherosclerotic changes are noted in the aortic arch. The  visualized soft tissues and bony thorax are unremarkable.  IMPRESSION: 1. No acute cardiopulmonary disease. 2. Atherosclerosis. 3. Moderate-sized hiatal hernia.   Electronically Signed   By: Marin Roberts M.D.   On: 01/14/2015 11:02   Ct Head Wo Contrast  01/14/2015   CLINICAL DATA:  Unwitnessed fall. The patient does not recall the fall. Unknown loss of consciousness.  EXAM: CT HEAD WITHOUT CONTRAST  CT CERVICAL SPINE WITHOUT CONTRAST  TECHNIQUE: Multidetector CT imaging of the head and cervical spine was performed following the standard protocol without intravenous contrast. Multiplanar CT image reconstructions of the cervical spine were also generated.  COMPARISON:  None.  FINDINGS: CT HEAD FINDINGS  No acute infarct, hemorrhage, or mass lesion is present. Mild atrophy is within normal limits for age. Insert pass ventricles insert pass fluid  Soft tissue swelling is present over the right parietal and occipital scalp without an underlying fracture. The calvarium is intact.  The paranasal sinuses and mastoid air cells are clear. Atherosclerotic calcifications are present within the cavernous internal carotid arteries and at the dural margin of the vertebral arteries.  CT CERVICAL SPINE FINDINGS  Cervical spine is imaged from the skullbase through T2-3. Endplate degenerative changes and uncovertebral spurring are evident at C5-6 and C6-7. Degenerative anterolisthesis is noted at C4-5. No acute fracture or traumatic subluxation is present. Atherosclerotic calcifications are present within the cavernous internal carotid arteries but laterally. The lung apices are clear. There is scarring at the right lung apex. The left apex is clear.  IMPRESSION: 1. Soft swelling over the right parietal and occipital scalp without an underlying fracture. 2. Normal CT appearance the brain. 3. Moderate degenerative changes of the cervical spine without evidence for acute fracture or traumatic subluxation.   Electronically  Signed   By: Marin Roberts M.D.   On: 01/14/2015 10:58   Ct Cervical Spine Wo Contrast  01/14/2015   CLINICAL DATA:  Unwitnessed fall. The patient does not recall the fall. Unknown loss of consciousness.  EXAM: CT HEAD WITHOUT CONTRAST  CT CERVICAL SPINE WITHOUT CONTRAST  TECHNIQUE: Multidetector CT imaging of the head and cervical spine was performed following the standard protocol without intravenous contrast. Multiplanar CT image reconstructions of the cervical spine were also generated.  COMPARISON:  None.  FINDINGS: CT HEAD FINDINGS  No acute infarct, hemorrhage, or mass lesion is present. Mild atrophy is within normal limits for age. Insert pass ventricles insert pass fluid  Soft tissue swelling is present over the right parietal and occipital scalp without an underlying fracture. The calvarium is intact.  The paranasal sinuses and mastoid air cells are clear. Atherosclerotic calcifications are present within the  cavernous internal carotid arteries and at the dural margin of the vertebral arteries.  CT CERVICAL SPINE FINDINGS  Cervical spine is imaged from the skullbase through T2-3. Endplate degenerative changes and uncovertebral spurring are evident at C5-6 and C6-7. Degenerative anterolisthesis is noted at C4-5. No acute fracture or traumatic subluxation is present. Atherosclerotic calcifications are present within the cavernous internal carotid arteries but laterally. The lung apices are clear. There is scarring at the right lung apex. The left apex is clear.  IMPRESSION: 1. Soft swelling over the right parietal and occipital scalp without an underlying fracture. 2. Normal CT appearance the brain. 3. Moderate degenerative changes of the cervical spine without evidence for acute fracture or traumatic subluxation.   Electronically Signed   By: Marin Robertshristopher  Mattern M.D.   On: 01/14/2015 10:58   Medications: I have reviewed the patient's current medications. Scheduled Meds: . aspirin  81 mg Oral  Daily  . feeding supplement (ENSURE ENLIVE)  237 mL Oral BID BM  . heparin  5,000 Units Subcutaneous 3 times per day  . multivitamin with minerals  1 tablet Oral Daily  . PARoxetine  20 mg Oral q morning - 10a   Continuous Infusions: . sodium chloride 75 mL/hr at 01/15/15 0254   PRN Meds:.ondansetron, oxyCODONE-acetaminophen, temazepam Assessment/Plan: Principal Problem:   Syncope Active Problems:   Acute kidney injury   Patient is a 69 year old female with a history of recurrent syncope with falls, asymptomatic bradycardia, chronic back pain, hypertension, fibromyalgia, depression, anxiety who is admitted for syncope with a fall.  Syncope status post fall: Patient states that she is feeling better this morning and was able to do well with physical therapy. Patient has been volume repleted with IV fluids as her AKI resolved. Echocardiogram unremarkable for any depression and ejection fraction or valvular disease although the study was equivocal for pulmonary hypertension. There is an elevated pulmonary artery systolic pressure of 38 which is in the intermediate zone for ruling in pulmonary hypertension (greater than 50) and ruling out pulmonary hypertension (less than 36). Additionally, patient's tricuspid regurgitant velocity is also equivocal at 2.95 with a ruling in value of 3.4 and a rule out value of 2.8. Patient does not have any left ventricular dysfunction that would suggest an etiology for elevated right-sided pressures. Patient also does not have any obvious symptoms for secondary causes of pulmonary hypertension. Would question whether this may be the etiology for patient's recurrent syncopal and falls. Another consideration would be hypovolemia. -Referral to cardiology as an outpatient for further workup of pulmonary hypertension -No further recommendations from physical or occupational therapy. -If patient continues to have symptoms as an outpatient, may consider down titration of  her pain and anxiety medications.  Possible hyperthyroidism: Patient with a TSH of 0.138. Patient did report some heat intolerance although she also concurrently reported cold intolerance clinically. Patient denies any recent loss in weight. Patient denies any chronic diarrhea. -Consider further follow-up including free T3 and T4 as an outpatient.  Acute kidney injury: Creatinine resolved to 1.17 from 2.4 yesterday status post fluid resuscitation. -Continue 75 mL per hour normal saline infusion -Continue to hold home losartan  Normocytic anemia: Patient's hemoglobin down to 8.9 from 9.3 with an MCV of 86.8. Patient noted previously in the chart to have anemia of chronic disease. Admission from December 2015 showing that patient had ferrous sulfate supplementation although patient was not taking any upon admission this time. No evidence of bleeding at this time. -Would clarify as an outpatient iron  supplementation regimen with outpatient physician and previous workup for normocytic anemia.  Bradycardia: Patient continues to have bradycardia with rates between 55 and 61. Patient did not experience any symptoms working with physical therapy today. Workup for thyroid disease is actually remarkable for evidence suggestive of hyperthyroidism. -Avoid AV nodal blocking agents  Chronic back pain: At home, patient takes gabapentin 600 mg 3 times a day, oxymorphone 30 mg twice a day, Percocet 10-325 mg twice a day. Patient has a history of spinal stenosis and spondylolisthesis. Patient is status post laminectomy at L4 and L5. -Continue to hold home gabapentin. -Continue to hold home oxymorphone in the setting of acute encephalopathy. Plan to discharge patient on a reduced dosage. -Continue home Percocet a lower dosage of 5-325 mg 1-2 tablets every 6 hours when necessary.  Hypertension: Patient has remained normotensive over the last 24 hours. At home, patient takes amlodipine 10 mg daily, losartan 100 mg  daily. -Continue to hold home amlodipine and losartan.  Depression/anxiety: At home, patient takes Paxil 20 mg daily and temazepam 30 mg daily at bedtime. -Continue home Paxil 20 mg daily. -Reduce home temazepam from 30 mg daily to 15 mg daily.  Diet: Regular Prophylaxis: Heparin 5000 units 3 times a day Code: Full  Dispo: Disposition is deferred at this time, awaiting improvement of current medical problems. Anticipated discharge in approximately 0-1 day(s).   The patient does have a current PCP (No primary care provider on file.) and does need an [facility] hospital follow-up appointment after discharge.  The patient does not have transportation limitations that hinder transportation to clinic appointments.     Services Needed at time of discharge: Y = Yes, Blank = No PT:   OT:   RN:   Equipment:   Other:    Harold Barban, MD 01/15/2015, 2:11 PM

## 2015-01-15 NOTE — Evaluation (Addendum)
Physical Therapy Evaluation Patient Details Name: Gina Lowe MRN: [id number] DOB: [birth year] Today's Date: 01/15/2015   History of Present Illness  Patient is a 69 year old female with a history of recurrent syncope with falls, asymptomatic bradycardia, chronic back pain, hypertension, fibromyalgia, depression, anxiety who presents to the hospital for syncope with a fall.   Clinical Impression  Pt admitted with above complications. Pt currently with functional limitations due to the deficits listed below (see PT Problem List). Ambulates generally well with supervision assist and use of a rolling walker for balance. Denied any pre-syncopal type symptoms during therapy session. States she has near 24 hour supervision from her tenant at home. Pt will benefit from skilled PT to increase their independence and safety with mobility to allow discharge to the venue listed below.       Follow Up Recommendations Home health PT    Equipment Recommendations  None recommended by PT    Recommendations for Other Services       Precautions / Restrictions Precautions Precautions: Fall Precaution Comments: syncopal Restrictions Weight Bearing Restrictions: No      Mobility  Bed Mobility Overal bed mobility: Modified Independent             General bed mobility comments: Requires extra time  Transfers Overall transfer level: Needs assistance Equipment used: Rolling walker (2 wheeled) Transfers: Sit to/from Stand Sit to Stand: Supervision         General transfer comment: Supervision for safety. VC for hand placement. Performed from lowest bed setting and BSC. Uses back of knees on bed and BSC for stability prior to reaching fro walker. Denies dizziness upon standing. Mildly antalgic pattern  Ambulation/Gait Ambulation/Gait assistance: Supervision Ambulation Distance (Feet): 150 Feet Assistive device: Rolling walker (2 wheeled) Gait Pattern/deviations: Step-through  pattern;Decreased step length - left;Decreased stance time - right;Antalgic;Decreased stride length Gait velocity: decreased   General Gait Details: Educated on safe DME use with a rolling walker. VC for forward gaze and walker placement for proximity. No loss of balance noted during bout. Denies syncopal type symptoms. She did require one standing rest break to complete full distance. HR 80s-103 during bout.  Stairs            Wheelchair Mobility    Modified Rankin (Stroke Patients Only)       Balance Overall balance assessment: Needs assistance;History of Falls Sitting-balance support: No upper extremity supported;Feet supported Sitting balance-Leahy Scale: Good     Standing balance support: No upper extremity supported Standing balance-Leahy Scale: Fair                               Pertinent Vitals/Pain Pain Assessment: 0-10 Pain Score: 5  Pain Location: "slight headache" Pain Descriptors / Indicators: Aching Pain Intervention(s): Monitored during session;Repositioned    Home Living Family/patient expects to be discharged to:: Private residence Living Arrangements: Alone Available Help at Discharge: Other (Comment) (House Tenant "most of the day") Type of Home: Mobile home Home Access: Ramped entrance     Home Layout: One level Home Equipment: Cane - single point;Wheelchair - [employer] - 2 wheels;Bedside commode;Shower seat      Prior Function Level of Independence: Independent with assistive device(s)         Comments: Uses cane/RW at times     Hand Dominance   Dominant Hand: Right    Extremity/Trunk Assessment   Upper Extremity Assessment: Defer to OT evaluation  Lower Extremity Assessment: Generalized weakness (Hx of bilateral TKA)         Communication   Communication: No difficulties  Cognition Arousal/Alertness: Awake/alert Behavior During Therapy: WFL for tasks assessed/performed Overall Cognitive  Status: Within Functional Limits for tasks assessed                      General Comments      Exercises        Assessment/Plan    PT Assessment Patient needs continued PT services  PT Diagnosis Abnormality of gait;Generalized weakness;Difficulty walking   PT Problem List Decreased strength;Decreased activity tolerance;Decreased balance;Decreased mobility;Decreased knowledge of use of DME;Pain  PT Treatment Interventions DME instruction;Gait training;Functional mobility training;Therapeutic activities;Therapeutic exercise;Balance training;Neuromuscular re-education;Patient/family education   PT Goals (Current goals can be found in the Care Plan section) Acute Rehab PT Goals Patient Stated Goal: Not fall PT Goal Formulation: With patient Time For Goal Achievement: 01/29/15 Potential to Achieve Goals: Good    Frequency Min 3X/week   Barriers to discharge Decreased caregiver support Tenant is with patient "most of the time"    Co-evaluation               End of Session Equipment Utilized During Treatment: Gait belt Activity Tolerance: Patient tolerated treatment well Patient left: in chair;with call bell/phone within reach;with chair alarm set Nurse Communication: Mobility status    Functional Assessment Tool Used: clinical observation Functional Limitation: Mobility: Walking and moving around Mobility: Walking and Moving Around Current Status [phone](G8978): At least 1 percent but less than 20 percent impaired, limited or restricted Mobility: Walking and Moving Around Goal Status [phone]([id number]): At least 1 percent but less than 20 percent impaired, limited or restricted    Time: [id number] PT Time Calculation (min) (ACUTE ONLY): 32 min   Charges:   PT Evaluation $Initial PT Evaluation Tier I: 1 Procedure PT Treatments $Gait Training: 8-22 mins   PT G Codes:   PT G-Codes **NOT FOR INPATIENT CLASS** Functional Assessment Tool Used: clinical observation Functional  Limitation: Mobility: Walking and moving around Mobility: Walking and Moving Around Current Status ([id number]): At least 1 percent but less than 20 percent impaired, limited or restricted Mobility: Walking and Moving Around Goal Status [phone](G8979): At least 1 percent but less than 20 percent impaired, limited or restricted    [place]erton MountBarbour, Lan Mcneill S 01/15/2015, 10:09 AM Charlsie MerlesLogan Secor Elanda Garmany, PT [phone]  Addendum for time

## 2015-01-15 NOTE — Progress Notes (Signed)
Patient was discharged home by MD order; discharged instructions  review and give to patient and her son with care notes; IV DIC; skin intact; patient will be escorted to the car by nurse tech via wheelchair.

## 2015-01-15 NOTE — H&P (Signed)
PATIENT NAME:  Gina Lowe, Gina Lowe MR#:  [id number] DATE OF BIRTH:  20-Apr-1946  DATE OF ADMISSION:  09/09/2014  PRIMARY CARE PROVIDER: Lonie Peak, M.D.   CHIEF COMPLAINT: Fall, syncope, weakness.   HISTORY OF PRESENT ILLNESS:  A 69 year old Caucasian female patient with history of hypertension, irritable bowel syndrome who, at baseline, walks with a walker, has had worsening weakness, recurrent syncope and fall over the last five days. The patient has been found down on the floor, conscious on 3 episodes but the patient does not remember how she got there.  It is not clear if she had blacked doubt.   The patient had some bruising on the right eye, which has resolved. She did not hit her head. No other joint pains. The patient has had surgery on her right knee, which seems to be healing well other than having some mild swelling and that leg. No fever, no bleeding, no shortness of breath, nausea or vomiting. The patient has had decreased urination. Also, loss of appetite and decreased oral intake.   PAST MEDICAL HISTORY:  1.  Hypertension.  2.  Irritable bowel syndrome.   PAST SURGICAL HISTORY:  1.  Back surgery.  2.  Bilateral total knee arthroplasties.   SOCIAL HISTORY: The patient does not smoke. No illicit drugs. Ambulates with a walker at baseline.   CODE STATUS: Full code.   FAMILY HISTORY: No history of colon cancer or inflammatory bowel disease.   ALLERGIES: LISINOPRIL.    REVIEW OF SYSTEMS:  CONSTITUTIONAL: Complains of fatigue, weakness.  EYES: No blurred vision, pain or redness.  ENT: No tinnitus, ear pain, hearing loss.  RESPIRATORY: No cough, wheeze, hemoptysis.  CARDIOVASCULAR: No chest pain, orthopnea, or edema.  GASTROINTESTINAL: No nausea, vomiting, diarrhea, or abdominal pain.  GENITOURINARY:  She has decreased urination.  ENDOCRINE: No polyuria, nocturia, thyroid problems.  HEMATOLOGY AND LYMPHATICS:  Has chronic anemia.  INTEGUMENTARY: No acne, rash, lesion.   MUSCULOSKELETAL: Has pain in her knee from surgery.  NEUROLOGIC: No focal numbness, weakness, seizure.  PSYCHIATRIC: No anxiety, depression.   HOME MEDICATIONS:  1.  Losartan and hydrochlorothiazide 1 tablet daily.  2.  Symbicort 2 puffs b.i.d.  3.  Atorvastatin 20 mg daily.  4.  Bisoprolol 10 mg suppository as needed.  5.  Celecoxib 200 mg oral 2 times a day.  6.  Eliquis 2.5 mg oral 2 times a day.  7.  Ferrous sulfate 325 mg oral 2 times a day.  8.  Gabapentin 600 mg 3 times day.  9.  Ondansetron 4 mg oral 3 times a day as needed for nausea and vomiting.  10.  Paroxetine 20 mg daily.  11.  Temazepam 30 mg daily at bedtime.  12.  Ventolin HFA 2 puffs inhaled 4 times a day as needed.   PHYSICAL EXAMINATION:  VITAL SIGNS: Temperature 98.1, pulse of 70, blood pressure 118/74, saturating 97% on room air.  GENERAL: Moderately built Caucasian female patient lying in bed, overall seems comfortable, conversational, cooperative with exam.   PSYCHIATRIC: Alert and oriented x 3, pleasant.   HEENT:  Atraumatic, normocephalic, oral mucosa is dry and pink. Pallor positive. No icterus. Pupils bilaterally equal and reactive to light.   NECK: Supple. No thyromegaly. No palpable lymph nodes. Trachea midline. No carotid bruit, JVD.  CARDIOVASCULAR: S1, S2, without any murmurs. Peripheral pulses 2+ edema on the right leg.  RESPIRATORY: Normal work of breathing. Clear to auscultation on both sides.  GASTROINTESTINAL: Soft abdomen, nontender. Bowel sounds present. No  hepatosplenomegaly palpable.  SKIN: Warm and dry. No petechiae, rash, ulcers. Has scars from prior knee surgery, well healed.  MUSCULOSKELETAL: No joint swelling, redness, effusion of the large joints. Normal muscle tone. Has some mild swelling on the right knee from surgery.  NEUROLOGICAL: Motor strength 5/5 in upper and lower extremities. Sensation was intact all over.  LYMPHATIC: No cervical lymphadenopathy.   LABORATORY STUDIES: Show  glucose of 112, BUN 36, creatinine 2.18, sodium 139, potassium 2.8 GFR 24. Troponin less than 0.02. WBC 8.9, hemoglobin 9.6, platelets of 277, neutrophils 81%.   Urinalysis shows 2+ leukocyte esterase, 82 WBCs but no bacteria, one epithelial cell.   EKG shows LVH with occasional PVCs, nonspecific ST-T wave changes.   CT scan of the head without contrast shows no acute abnormalities other than cerebral atrophy, age-related and microvascular changes.   ASSESSMENT AND PLAN:  1.  Acute renal failure in a patient with hypertension.  Renal failure is likely from decreased urination. She does not have any abdominal pain. We will give her a bolus of normal saline now and put her on continuous intravenous fluids.  Encouraged her to increase her fluid intake, monitor ins and outs daily weights and creatinine. If there is any worsening nephrology needs to be consulted.  2.  Recurrent falls the patient does not remember these episodes; possibly has had orthostatic hypotension and syncope. Check orthostatic vitals, fluid resuscitate, also with possible arrhythmia secondary to hypokalemia. We will put her on tele while work-up is underway. Consult physical therapy for rehab needs.  3.  Hypokalemia. The patient is on hydrochlorothiazide, which is possibly causing her hypokalemia, also decreased oral intake. We will replace with oral and intravenous, 40 mEq, repeat in 4 hours. Start her on potassium 20 mEq daily from tomorrow. Also check magnesium levels.  4.  Hypertension.  Hold losartan and hydrochlorothiazide secondary to acute renal failure.  Will use intravenous as needed hydroxyzine.  Restart medications once creatinine is better  5.  Right lower extremity swelling. This is likely postop from her knee surgery, but need to rule out deep venous thrombosis. We will get a Doppler of the right lower extremity to rule out deep venous thrombosis  6.  Anemia chronic disease, stable. The patient is on a high and  supplementation, which will be continued.  7.  Deep vein thrombosis prophylaxis with heparin.   CODE STATUS: Full code.   Time Spent today on this case was 50 minutes.    ____________________________ Molinda BailiffSrikar R. Bryana Froemming, MD srs:at D: 09/09/2014 14:47:26 ET T: 09/09/2014 15:36:05 ET JOB#: [id number]  cc: Wardell HeathSrikar R. Antrone Walla, MD, <Dictator> Lonie PeakNathan Conroy, MD  Orie FishermanSRIKAR R Shaquisha Wynn MD ELECTRONICALLY SIGNED 10/04/2014 12:48

## 2015-01-15 NOTE — Care Management Note (Signed)
    Page 1 of 1   01/15/2015     5:09:24 PM CARE MANAGEMENT NOTE 01/15/2015  Patient:  Gina DesanctisBLACK,Idamay L   Account Number:  [id number]  Date Initiated:  01/15/2015  Documentation initiated by:  Letha CapeAYLOR,Kurt Hoffmeier  Subjective/Objective Assessment:   dx syncope  admit as observation-has caregiver at home.     Action/Plan:   pt eval- hhpt   Anticipated DC Date:  01/15/2015   Anticipated DC Plan:  HOME W HOME HEALTH SERVICES      DC Planning Services  CM consult      [facility]   Choice offered to / List presented to:  C-1 Patient        HH arranged  HH-2 PT      [facility]   Status of service:  Completed, signed off Medicare Important Message given?  NO (If response is "NO", the following Medicare IM given date fields will be blank) Date Medicare IM given:   Medicare IM given by:   Date Additional Medicare IM given:   Additional Medicare IM given by:    Discharge Disposition:  HOME W HOME HEALTH SERVICES  Per UR Regulation:  Reviewed for med. necessity/level of care/duration of stay  If discussed at Long Length of Stay Meetings, dates discussed:    Comments:  01/15/15 1707 Letha Capeeborah Aimar Shrewsbury RN, BSN [phone] patient is for dc today, MD ordered hhpt at end of day , NCM offered choice to patient ,[facility], [facility] to [facility],  NCM called and they state the office is closed they will have to call me in the am to make sure they can see patient.

## 2015-01-15 NOTE — Discharge Instructions (Signed)
Please follow up with your cardiologist to further workup possible pulmonary hypertension and slow heart rate. Please follow up with your PCP regarding your thyroid function and low blood counts.

## 2015-01-19 NOTE — Consult Note (Signed)
Chief Complaint:  Subjective/Chief Complaint Please see full EGD report.  EGD showing maiked erosive esophagitis and a moderated sized sliding hiatal hernia.  No active bleeding.  Tx plan per EGD report.  Will arrange for colonoscopy tomorrow.  Clear liquids today.   Electronic Signatures: Barnetta ChapelSkulskie, Martin (MD)  (Signed 18-Feb-16 15:56)  Authored: Chief Complaint   Last Updated: 18-Feb-16 15:56 by Barnetta ChapelSkulskie, Martin (MD)

## 2015-01-19 NOTE — Discharge Summary (Signed)
PATIENT NAME:  Gina Lowe, GOODRIDGE MR#:  [id number] DATE OF BIRTH:  11/29/1945  DATE OF ADMISSION:  11/05/2014 DATE OF DISCHARGE:  11/08/2014  ADMITTING DIAGNOSES:  1.  Worsening of anemia.  2.  Acute renal failure on chronic kidney disease, stage III.  3.  Chronic pain syndrome.  4.  Hypertension.  5.  Recurrent falls.   DISCHARGE DIAGNOSES:  1.  Anemia secondary to gastrointestinal hemorrhage.  2.  Erosive esophagitis on EGD.  3.  Acute renal failure on chronic kidney disease, stage III.  4.  Chronic pain syndrome.   CONSULTATIONS: Gastroenterology.   PROCEDURES: EGD was done on February 18 and a colonoscopy was performed on February 19 by Dr. Marva Panda.   BRIEF HISTORY AND PHYSICAL AND HOSPITAL COURSE BASED ON THE PROBLEM:  1.  Worsening of anemia of chronic disease. The patient is a 69 year old Caucasian female who came into the ED with a chief complaint of weakness and falls. Please see history and physical for details. The patient was just recently admitted to the hospital with syncope and falls in the past 2 months. During this admission, her hemoglobin was at 6.2 along with stool positive for blood as well as melena. Admitted to the hospitalist service. Gastroenterology was consulted. The patient has received 2 units of blood transfusion, as her initial hemoglobin was at 6.2. Held her home medication aspirin. The patient was complaining of bright red blood with melena stool. The melena was thought to be due to chronic use of iron pills. The patient was seen by gastroenterology and had upper GI endoscopy done on February 18 which has revealed LA grade C erosive esophagitis and tortuous esophagus. No specimens were collected. The patient had colonoscopy done on February 19. The colonoscopy on February 19 has revealed diverticulosis in the sigmoid colon, in the descending colon, and in the transverse colon. Non-thrombosed external hemorrhoids found on the digital rectal examination. Following  the procedure, the patient was given a soft diet which she tolerated well. Dr. Marva Panda has recommended to discharge the patient with Carafate and Protonix 40 mg twice a day for 4 weeks and he will follow up with gastroenterology as an outpatient. The patient was taking Eliquis which was discontinued as she finished the course, according to the recent discharge summary during the previous admission. Dr. Marva Panda recommended to resume her home dose aspirin enteric-coated 81 mg on a daily basis at the time of discharge.  2.  Acute renal failure on chronic kidney disease. With IV fluids, the patient's creatinine is close to baseline. The plan is to avoid nephrotoxins.  3.  Chronic pain syndrome. Continue her home medication oxycodone 20 mg 5 times a day.  4.  For recurrent falls, the patient was evaluated by physical therapy. The recurrent falls were thought to be from peripheral neuropathy. As per PT recommendations, the patient was sent home with home physical therapy after colonoscopy on February 19.  5.  [name] melena was thought to be from iron pills and bright-red blood was thought to be from hemorrhoids. Also, the patient was found to have erosive esophagitis per EGD.   CONDITION AT THE TIME OF DISCHARGE: Stable.   PHYSICAL EXAMINATION: VITAL SIGNS: Temperature 98.1, pulse 84, respirations 18, blood pressure 143/85, pulse oximetry 94% at rest on room air.  GENERAL: The patient is an elderly female not in acute distress. Moderately built and nourished.  HEENT: Normocephalic, atraumatic. Pupils are equal, reacting to light and accommodation. No scleral icterus. No conjunctival injection.  No sinus tenderness. Moist mucous membranes.  NECK: Supple. No JVD. No thyromegaly. Range of motion is intact.  LUNGS: Clear to auscultation bilaterally. No accessory muscle use and no anterior chest wall tenderness on palpation.  CARDIAC: S1, S2 normal. Regular rate and rhythm. No murmurs.  GASTROINTESTINAL:  Soft. Bowel sounds are positive in all 4 quadrants. Nontender, nondistended. No masses.  NEUROLOGIC: Awake, alert, oriented x 3. Cranial nerves II through XII are intact. Motor and  sensory are intact. Reflexes are 2+.  EXTREMITIES: No edema, no cyanosis, no clubbing.  SKIN: Warm to touch. Normal turgor. No rashes. No lesions.   LABORATORIES AND IMAGING STUDIES: On February 19, potassium 3.1, the rest of the BMP is normal. WBC 4.8, hemoglobin 9.8, hematocrit 30.0, platelets are 353,000.   MEDICATIONS AT THE TIME OF DISCHARGE: Atorvastatin 20 mg p.o. at bedtime, temazepam 30 mg 1 capsule p.o. once daily at bedtime as needed for insomnia, ondansetron 4 mg 1 tablet p.o. every 8 hours as needed for nausea and vomiting, gabapentin 800 mg 1 tablet p.o. 3 times a day, amlodipine 10 mg p.o. once daily, iron sulfate 325 mg 1 tablet p.o. 2 times a day, hydrochlorothiazide/losartan 25/100 one tablet p.o. once daily, oxycodone 20 mg 1 tablet p.o. 5 times a day as needed for pain which is her home medication, aspirin enteric-coated 81 mg p.o. once daily, Carafate 1 gram 1 tablet p.o. 4 times a day before each meal and at bedtime, pantoprazole 40 mg 1 tablet p.o. 2 times a day for 4 weeks and then as recommended by GI. Eliquis was discontinued during the previous discharge.   DISPOSITION: The patient was discharged home with home health, with physical therapy and nurse.   DIET: Low-sodium, iron-rich, regular consistency.   ACTIVITY: As tolerated, as recommended by physical therapy. Return to work after followup visit with primary care physician.   FOLLOWUP: With primary care physician in 1 week. Gastroenterology in 2 to 4 weeks for upper GI series and a small bowel follow-through as recommended by Dr. Marva PandaSkulskie. The diagnosis and plan of care was discussed in detail with the patient. She verbalized understanding of the plan.   TOTAL TIME SPENT ON THE DISCHARGE AND COORDINATION OF CARE: 45 minutes.      ____________________________ Ramonita LabAruna Jerzie Bieri, MD ag:at D: 11/13/2014 22:51:12 ET T: 11/14/2014 10:21:39 ET JOB#: [id number]  cc: Ramonita LabAruna Genene Kilman, MD, <Dictator> Ramonita LabARUNA Hadlei Stitt MD ELECTRONICALLY SIGNED 11/15/2014 15:08

## 2015-01-19 NOTE — Consult Note (Signed)
Brief Consult Note: Diagnosis: weakness, falls.   Patient was seen by consultant.   Consult note dictated.   Comments: APpreciate consult for 69 y.o caucasian woman admitted w/ weakness & falls, found to have anemia exacerbation with heme positive stool, for evaluation of anemia/ heme pos stool. Patient reports history of fibromyalgia and TKR last Nov. Takes 20mg   oxycodone 4-5times/d and celebrex bid. Not known how long she has been on celebrex. Was on Eliquis for DVT prophylaxis after TKR. Last dose yesterday am. Not been on PPI. Has had frequent hospitalizations- last month for UTI, December for syncope/UTI. States her only complaints are fatigue, weakness, and intermittent cough. Denies abdominal pain, dyspepsia, hematochezia. States her stools have been dark since starting Fe therapy and have not changed any over the last few weeks. States no cp or sob. Currently being treated with Pantoprazole gtt. Received PRbcs, with improvement in hgb to about 8.5 (yest was 6.2). Platelets normal. wbc normal. Pt/inr normal. States last colonoscopy 6-2366yr ago, and had EGD about 5866yr ago. Impression/plan: heme positive stool. Risk factors for PUD. Agree with serial hemoglobins, transfusing prn, and Pantoprazole gtt. Do recommend EGD as clinically feasible at some point- however has been on eliquis until yesterday- will discuss further with Dr Marva PandaSkulskie. She may also benefit from a re-evaluation of her chronic narcotic therapy in terms of falls/weakness/side effects. ** found colonoscopy reports: 2007 with ischemic colitis, follow up colonoscopy 2008 with diverticula. I do not see any EGD reports..  Electronic Signatures: Vevelyn PatLondon, Yvanna Vidas H (NP)  (Signed 17-Feb-16 14:08)  Authored: Brief Consult Note   Last Updated: 17-Feb-16 14:08 by Keturah BarreLondon, Skyleen Bentley H (NP)

## 2015-01-19 NOTE — Discharge Summary (Signed)
PATIENT NAME:  Gina DesanctisBLACK, Tauri L MR#:  [id number] DATE OF BIRTH:  07/27/1946  DATE OF ADMISSION:  10/14/2014 DATE OF DISCHARGE:  10/17/2014  ADDENDUM   Ms. Kirkpatrick's urine grew more than 100,000 gram-positive cocci. She does have history of frequent urinary tract infections in the past and methicillin-resistant Staphylococcus aureus in the past. I will cover her with p.o. Bactrim for 7 days.    ____________________________ Wylie HailSona A. Allena KatzPatel, MD sap:mw D: 10/17/2014 14:54:46 ET T: 10/17/2014 18:56:59 ET JOB#: [id number]  cc: Rudean Icenhour A. Allena KatzPatel, MD, <Dictator> Willow OraSONA A Valgene Deloatch MD ELECTRONICALLY SIGNED 10/22/2014 11:54

## 2015-01-19 NOTE — Consult Note (Signed)
PATIENT NAME:  Gina Lowe, Gina Lowe MR#:  [id number] DATE OF BIRTH:  November 27, 1945  DATE OF CONSULTATION:  11/06/2014  REFERRING PHYSICIAN:   CONSULTING PHYSICIAN:  Keturah Barrehristiane H. Dorann Davidson, NP  REASON FOR CONSULTATION: GI consult ordered by Dr. Elpidio AnisSudini for evaluation of anemia.   HISTORY OF PRESENT ILLNESS: Appreciate consult for 69 year old Caucasian woman admitted with weakness and falls, found to have anemia exacerbation with heme-positive stool, for evaluation of anemia and heme-positive stool. The patient reports history of fibromyalgia and total knee replacement last November, takes 20 mg oxycodone 4-5 times a day and Celebrex b.i.d., not known how long she has been on Celebrex, was on Eliquis for DVT prophylaxis after TKR, last dose yesterday a.m. Has not been on PPI.  Has had frequent hospitalizations, last month for UTI,  December for syncope and UTI. States her only complaints are fatigue, weakness, and intermittent cough. Denies abdominal pain, dyspepsia, hematochezia. States her stools have been dark since starting iron therapy and have not changed any over the last few weeks. States no chest pain or shortness of breath. Currently being treated with pantoprazole drip. She has received packed red blood cells with improvement to her hemoglobin to about 8.5, yesterday she was 6.2. Her platelets are normal. Her white count is normal. PT/INR is normal. States last colonoscopy was 6-8 years ago, this was evidently done by Dr. Marva PandaSkulskie in 2007 with some ischemic colitis. She had a followup colonoscopy in 2008 with diverticula. I do not see any EGD reports.   PAST MEDICAL HISTORY: Hypertension, ACD, neuropathy, fibromyalgia, recurrent falls, recurrent UTI, chronic pain syndrome with narcotic therapy, CKD stage III, lower back surgery, bilateral total knee replacement in November of 2015.   ALLERGIES: LISINOPRIL.   SOCIAL HISTORY: Lives alone. Ambulates with walker. No EtOH or tobacco.   HOME MEDICATIONS:  Amlodipine 10 mg p.o. daily, ASA 81 mg p.o. daily, atorvastatin 20 mg p.o. daily, Celebrex 200 mg b.i.d., ferrous sulfate 325 b.i.d., gabapentin 800 mg t.i.d., HCTZ/losartan 25 mg/100 mg 1 tab once a day, Zofran 4 mg q. 8 h. p.r.n., oxycodone 20 mg 5 times a day as needed for pain, temazepam 30 mg once a day at bedtime p.r.n. insomnia, Eliquis b.i.d., last dose yesterday.   REVIEW OF SYSTEMS:  Ten systems reviewed, unremarkable other than what is noted above.   LABORATORY DATA: Most recent laboratories, glucose 86, BUN 33, creatinine 1.27, sodium 142, potassium 4, chloride 108, GFR 44, calcium 8.8. Troponin less than 0.02. WBC 5.3, hemoglobin 8.5, hematocrit 26.3, platelet count 262,000. PT 13.8, INR 1.0.   PHYSICAL EXAMINATION:  VITAL SIGNS: Most recent vital signs, temperature 97.1, pulse 67, respiratory rate 16, blood pressure 140/78, SaO2 of 93% on room air.  GENERAL: Medium-sized, pleasant Caucasian woman lying in bed, in no acute distress.  HEENT: Normocephalic, atraumatic. Conjunctivae pink. Sclerae are clear.  NECK: Supple. No thyromegaly or lymphadenopathy. No JVD.  CARDIAC: S1, S2. RRR. No MRG.  CHEST: Respirations eupneic. Lungs clear bilaterally.  ABDOMEN: Bowel sounds x 4. Soft, nondistended, nontender. No guarding, rigidity, peritoneal signs. No hepatosplenomegaly, masses, or other abnormalities.  SKIN: Warm, dry, pale, pink.  EXTREMITIES: No clubbing or cyanosis. MAEW x 4.  NEUROLOGICAL: Alert, oriented x 3. Cranial nerves II through XII intact.  PSYCHIATRIC: Pleasant, calm, cooperative.   IMPRESSION AND PLAN: Anemia exacerbation with heme-positive stool. She has risk factors for peptic ulcer disease. Agree with serial hemoglobins, transfusing p.r.n., and pantoprazole drip. Do recommend EGD as clinically feasible. She has been off Eliquis  since yesterday. In review with this with Dr. Marva Panda she should be off this for a total of approximately 48 hours prior to invasive procedures  if her creatinine is 1.5 or greater, her  last was 1.27. We will plan for an EGD tomorrow as clinically feasible for evaluation of her ailment and potentially follow with colonoscopy the next day. She also may benefit from a reevaluation of her chronic narcotic therapy in terms of falls, weakness, and side effects.   Thank you very much for this consult. These services were provided by Vevelyn Pat, MSN, [facility], in collaboration with Barnetta Chapel, MD. with whom I discussed this patient in full.    ____________________________ Keturah Barre, NP chl:bu D: 11/06/2014 15:19:05 ET T: 11/06/2014 15:39:09 ET JOB#: [id number]  cc: Keturah Barre, NP, <Dictator> Eustaquio Maize Burnard Enis FNP ELECTRONICALLY SIGNED 11/07/2014 17:05

## 2015-01-19 NOTE — H&P (Signed)
PATIENT NAME:  Jim DesanctisBLACK, Gina Lowe DATE OF BIRTH:  Nov 10, 1945  DATE OF ADMISSION:  10/14/2014  ADMITTING PHYSICIAN: Enid Baasadhika Azzam Mehra, MD    PRIMARY CARE PHYSICIAN: Lonie PeakNathan Conroy, [place] [place].  CHIEF COMPLAINT: Fall and weakness.   HISTORY OF PRESENT ILLNESS: Gina Lowe is a 69 year old Caucasian female with past medical history significant for hypertension, fibromyalgia, peripheral neuropathy, recurrent falls, history of frequent UTIs, who was recently in the hospital about a month ago in 08/2014 for UTI at which time she was discharged to [facility]. The patient has been home for the last 2-3 weeks according to son. She lives by herself, has a lady who comes in and checks on her and helps her around the house. The patient has a walker to ambulate. She says she did grade for the first week after discharge from rehabilitation. However, the last week she started to feel weak. She has chronic dysuria and she said her urinary frequency has been increased over the last couple of days. Last night she was trying to get out of couch and then the next thing she remembers she has been on the floor until the lady who helps her came down to get  her up. She is noted to have significant UTI on her labs and also in acute renal failure, blood pressure is below normal requiring IV fluid boluses here. Chest x-ray also revealed that the patient has possible left lower lobe pneumonia. She denies any aspiration per se, but she does say that occasionally she has cough with some solid foods. She denies any fevers or chills, but has been complaining of worsening weakness in her legs.   PAST MEDICAL HISTORY:  1.  Hypertension.  2.  Anemia of chronic disease.  3.  Peripheral neuropathy.  4.  Fibromyalgia.  5.  Recurrent falls.  6.  Recurrent UTIs.   PAST SURGICAL HISTORY:  1.  Lower back surgery.  2.  Bilateral total knee replacement surgery, the right surgery being  in 07/2014.   ALLERGIES TO MEDICATIONS: LISINOPRIL.   CURRENT HOME MEDICATIONS: Include: 1.  Amlodipine 10 mg p.o. daily. 2.  Aspirin 81 mg p.o. daily.  3.  Atorvastatin 20 mg p.o. at bedtime.  4.  Celecoxib 400 mg p.o. daily.  5.  Eliquis 2.5 mg p.o. b.i.d.  6.  Ferrous sulfate 325 mg p.o. b.i.d.  7.  Gabapentin 800 mg p.o. t.i.d.  8.  Hydrochlorothiazide/losartan 25/100 mg p.o. daily.  9.  Zofran 4 mg q. 8 hours p.r.n. for nausea and vomiting.  10.  Oxycodone 20 mg 5 times a day as needed for pain.  11.  Temazepam 30 mg at bedtime p.r.n. for insomnia.   SOCIAL HISTORY: Lives at home by herself, uses a walker to ambulate. No smoking, no alcohol history. Has a lady who rents their house come in to help her around.   FAMILY HISTORY: Significant for hypertension and heart disease.  REVIEW OF SYSTEMS: CONSTITUTIONAL: No fever. Positive for fatigue and weakness.  EYES: No blurred vision, double vision, inflammation, or glaucoma.  EARS, NOSE, AND THROAT: No tinnitus, ear pain, hearing loss, epistaxis, or discharge. Positive for dysphagia.  RESPIRATORY: No cough, wheeze, hemoptysis, or COPD.  CARDIOVASCULAR: No chest pain, orthopnea, edema, arrhythmia, palpitations, or syncope.  GASTROINTESTINAL: No nausea, vomiting, diarrhea, abdominal pain, hematemesis, or melena.  GENITOURINARY: Positive for dysuria, increased frequency. No hematuria or incontinence. ENDOCRINOLOGY: No polyuria, nocturia, thyroid problems, heat or cold intolerance.  HEMATOLOGY: No  anemia, easy bruising, or bleeding.  SKIN: No acne, rash, or lesions.  MUSCULOSKELETAL: Positive for both leg pains, back pain, history of arthritis. No gout.  NEUROLOGIC: No numbness, weakness, CVA, TIA, or seizures.  PSYCHOLOGICAL: No anxiety, insomnia, or depression.   PHYSICAL EXAMINATION:  VITAL SIGNS: Temperature 98.5 degrees Fahrenheit, pulse 70, respirations 18, blood pressure 97/53, pulse oximetry 88% on room air.  GENERAL:  Well-built, well-nourished female lying in bed, not in any acute distress.  HEENT: Normocephalic, atraumatic. Pupils equal, round, reacting to light. Anicteric sclerae. Extraocular movements intact. Oropharynx is clear without erythema, mass, or exudates.  NECK: Supple. No thyromegaly, JVD, or carotid bruits. No lymphadenopathy.  LUNGS: Clear to auscultation bilaterally. Coarse rhonchi, bibasilar breath sounds, worse on the right base. No use of accessory muscles for breathing.  CARDIOVASCULAR: S1, S2, regular rate and rhythm. A 3/6 systolic murmur heard. No rubs or gallops.  ABDOMEN: Soft, nontender, nondistended. No hepatosplenomegaly. Normal bowel sounds.  EXTREMITIES: Right leg with 1+ pedal edema. Left leg no edema. Dorsalis pedis pulses 2+ palpable bilaterally.  SKIN: No acne, rash, or lesions.  LYMPHATICS: No cervical lymphadenopathy.  NEUROLOGIC: Cranial nerves intact. No focal motor or sensory deficits.  PSYCHOLOGICAL: The patient is awake, alert, oriented x 3.   LABORATORY DATA: WBC 17.9, hemoglobin 9.7, hematocrit 30.1, platelet count 195,000.   Sodium 139, potassium 3.7, chloride 100, bicarbonate 32, BUN 54, creatinine 2.6, glucose 117, calcium of 8.8.   ALT 16, AST 34, alkaline phosphatase 55, total bilirubin 0.4, albumin of 2.9.   Urinalysis with 3+ leukocyte esterase, nitrite positive, trace bacteria and 120 wbc's. INR is 1.5. CK 439, CK-MB 6.5. Troponin is negative.   CT of the head showing no acute intracranial abnormality. Chest x-ray showing possible left lower lobe infiltrate, developing infection versus recent aspiration, moderate to large hiatal hernia, atherosclerosis noted.   EKG normal sinus rhythm, heart rate of 79, no acute ST-T wave abnormalities.   ASSESSMENT AND PLAN: A 69 year old female with history of irritable bowel syndrome, hypertension, neuropathy, recurrent urinary tract infections, recurrent falls, admitted for a fall, again noted to have sepsis from  urinary tract infection, pneumonia, acute renal failure, and hypertension.  1.  Sepsis secondary to pneumonia and urinary tract infection. Unfortunately, blood and urine cultures were not done in the ER prior to starting antibiotics. Stat blood and urine cultures have been ordered. The patient has methicillin-resistant Staphylococcus aureus urinary tract infection in 08/2014. Because of her recent hospital stay and being [facility] up until 2 weeks ago, we will cover for healthcare-acquired pneumonia, started on vancomycin, Zosyn, and Levaquin. Swallow evaluation has been ordered because the patient did complain of some dysphagia. So we will place on dysphagia diet until speech can evaluate her.  2.  Acute renal failure. Could be acute tubular necrosis from sepsis. Hold blood pressure medicines. IV fluids. Hold all nephrotoxins and renal ultrasound.  3.  Hypertension. Hold all medications.  4.  Peripheral neuropathy. Decrease the gabapentin dose with her renal failure and monitor; p.r.n. oxycodone.  5.  Physical therapy consult and social worker consult.  6.  Also note, right lower extremity swelling is chronic since her knee surgery in 07/2014. Last month they did an ultrasound Doppler to rule out deep vein thrombosis, it was negative, so we will not repeat again.  7.  The patient also noted to be on Eliquis. She denies any history of blood clots or atrial fibrillation. Family thinks it might be started since her knee surgery.  Now that it has been 60 days, she will not need any deep venous thrombosis prophylaxis and she has been ambulating with a walker. With her recurrent falls, she is always at risk for bleeding so we will discontinue Eliquis.  8.  The patient is DNR. Discussed this with the patient and son at bedside.  CODE STATUS: Do not resuscitate.   TIME SPENT ON ADMISSION: Fifty minutes.   ____________________________ Enid Baas, MD rk:TT D: 10/14/2014 20:20:27  ET T: 10/14/2014 20:54:19 ET JOB#: [id number]  cc: Enid Baas, MD, <Dictator> Lonie Peak, MD Enid Baas MD ELECTRONICALLY SIGNED 10/19/2014 13:30

## 2015-01-19 NOTE — Consult Note (Signed)
Chief Complaint:  Subjective/Chief Complaint Please see full GI consult and brief consult note. Patient seen and examined, chart reviewed.  Patient admitted with anemia and heme positive stools in the setting of h/o anemia of chronic disease.  Currently on nsaid (celebrex) and anticoagulant (elequis).   Occasional nausea, no emesis, Stoker stools at home she felt was due to iron supplement.  Recommend egd and colonoscopy and may need sbs/vce as well.  Will plan EGD for tomorrow pm (patietn reports she took last dose of elequis yesterday am, which will give 48 hours off this med) and colonoscopy friday.  I have discussed the risks benefits and complications of proceedures to include not limited to bleeding infection perforation and sedation and she wishes to proceed.   Continue ppi.   VITAL SIGNS/ANCILLARY NOTES: **Vital Signs.:   17-Feb-16 16:57  Vital Signs Type Routine  Temperature Temperature (F) 97  Celsius 36.1  Respirations Respirations 16  Systolic BP Systolic BP 156  Diastolic BP (mmHg) Diastolic BP (mmHg) 82  Mean BP 106  Pulse Ox % Pulse Ox % 94  Pulse Ox Activity Level  At rest  Oxygen Delivery Room Air/ 21 %  *Intake and Output.:   17-Feb-16 14:15  Stool  small formed stool   Brief Assessment:  Cardiac Regular   Respiratory clear BS   Gastrointestinal details normal Soft  Nontender  Nondistended  No masses palpable  Bowel sounds normal  minimal discomfort llq   Lab Results: Routine Chem:  16-Feb-16 14:53   BUN  44  Creatinine (comp)  1.63  17-Feb-16 03:14   BUN  33  Creatinine (comp) 1.27  Routine Hem:  16-Feb-16 14:53   Hemoglobin (CBC)  6.2  Platelet Count (CBC) 317  17-Feb-16 03:14   Hemoglobin (CBC)  8.5  Platelet Count (CBC) 262    15:44   Hemoglobin (CBC)  8.6 (Result(s) reported on 06 Nov 2014 at 04:11PM.)   Assessment/Plan:  Assessment/Plan:  Assessment as noted above.   Electronic Signatures: Barnetta ChapelSkulskie, Maynard David (MD)  (Signed 17-Feb-16  19:32)  Authored: Chief Complaint, VITAL SIGNS/ANCILLARY NOTES, Brief Assessment, Lab Results, Assessment/Plan   Last Updated: 17-Feb-16 19:32 by Barnetta ChapelSkulskie, Edlyn Rosenburg (MD)

## 2015-01-19 NOTE — Consult Note (Signed)
Chief Complaint:  Subjective/Chief Complaint patient seen for anemia and heme positive stool. tolerated prep for colonoscopy.  no emesis, but mild nausea, no abdominal pain.   VITAL SIGNS/ANCILLARY NOTES: **Vital Signs.:   19-Feb-16 12:26  Vital Signs Type Routine  Temperature Temperature (F) 98.2  Celsius 36.7  Pulse Pulse 79  Respirations Respirations 17  Systolic BP Systolic BP 654  Diastolic BP (mmHg) Diastolic BP (mmHg) 76  Mean BP 95  Pulse Ox % Pulse Ox % 98  Pulse Ox Activity Level  At rest  Oxygen Delivery Room Air/ 21 %   Brief Assessment:  Cardiac Regular   Respiratory clear BS   Gastrointestinal details normal Soft  Nontender  Nondistended  No masses palpable  Bowel sounds normal   Lab Results: Routine Chem:  19-Feb-16 06:08   Glucose, Serum 96  BUN 7  Creatinine (comp) 0.73  Sodium, Serum 140  Potassium, Serum  3.1  Chloride, Serum 103  CO2, Serum 25  Calcium (Total), Serum 8.7  Anion Gap 12  Osmolality (calc) 277  eGFR (African American) >60  eGFR (Non-African American) >60 (eGFR values <83m/min/1.73 m2 may be an indication of chronic kidney disease (CKD). Calculated eGFR, using the MRDR Study equation, is useful in  patients with stable renal function. The eGFR calculation will not be reliable in acutely ill patients when serum creatinine is changing rapidly. It is not useful in patients on dialysis. The eGFR calculation may not be applicable to patients at the low and high extremes of body sizes, pregnant women, and vegetarians.)  Routine Hem:  18-Feb-16 05:24   Hemoglobin (CBC)  8.9  19-Feb-16 06:08   WBC (CBC) 4.8  RBC (CBC)  3.49  Hemoglobin (CBC)  9.8  Hematocrit (CBC)  30.0  Platelet Count (CBC) 353  MCV 86  MCH 28.0  MCHC 32.5  RDW  16.1  Neutrophil % 70.2  Lymphocyte % 19.4  Monocyte % 8.5  Eosinophil % 1.4  Basophil % 0.5  Neutrophil # 3.4  Lymphocyte #  0.9  Monocyte # 0.4  Eosinophil # 0.1  Basophil # 0.0 (Result(s)  reported on 08 Nov 2014 at 06:50AM.)   Assessment/Plan:  Assessment/Plan:  Assessment 1) anemia, heme positive stool 2) Hypertension,  Anemia of chronic disease with baseline hemoglobin around 9, Peripheral neuropathy, Fibromyalgia Recurrent falls, Recurrent UTIs,  Chronic pain syndrome on high-dose narcotics, CKD, stage III 3) egd yesterday showing large hiatal hernia and grade c erosive esophagitis.   Plan 1)continue ppi and carafate 2) colonoscopy today.  I have discussed the risks benefits and complications of egd to include not limited to bleeding infection perforationa dn sedation and she wishes to proceed.  further recs to follow.   Electronic Signatures: SLoistine Simas(MD)  (Signed 19-Feb-16 16:01)  Authored: Chief Complaint, VITAL SIGNS/ANCILLARY NOTES, Brief Assessment, Lab Results, Assessment/Plan   Last Updated: 19-Feb-16 16:01 by SLoistine Simas(MD)

## 2015-01-19 NOTE — Discharge Summary (Signed)
PATIENT NAME:  Gina Lowe, Gina Lowe MR#:  [id number] DATE OF BIRTH:  11/25/1945  DATE OF ADMISSION:  10/14/2014 DATE OF DISCHARGE:  10/17/2014   PRESENTING COMPLAINT: Fall and weakness.   DISCHARGE DIAGNOSES:  1.  Sepsis secondary to urinary tract infection and pneumonia.  2.  Acute renal failure, resolved.  3.  Hypotension secondary to dehydration, resolved.  4.  Peripheral neuropathy.  5.  Diarrhea.  6.  Right lower extremity swelling, which is chronic.   CONDITION ON DISCHARGE: Fair.   CODE STATUS: No code, DO NOT RESUSCITATE.   LABORATORY DATA: At discharge:  1.  Phosphorus is 2.5. White count is 8.5, C. difficile negative.  2.  Right knee x-ray no evidence of fracture or dislocation.   3.  Right hip x-ray no fracture identified.  4.  Creatinine is 1.1, sodium is 144, potassium is 3.7.  5.  Ultrasound of kidneys, normal renal ultrasound.  6.  White count on admission was 17.3.  7.  Blood cultures negative in 48 hours.  8.  Lactic acid is 1.6.   MEDICATIONS AT DISCHARGE:  1.  Tylenol 650 p.o. every 4 p.r.n.  2.  Aspirin 81 mg daily.  3.  Atorvastatin 20 mg at bedtime.  4.  Celebrex 400 mg daily.  5.  Docusate 100 mg b.i.d. p.r.n.  6.  Ferrous sulfate 325 mg p.o. b.i.d.  7.  Gabapentin 300 mg t.i.d.  8.  Oxycodone 5 mg every 4 p.r.n.  9.  Senokot 1 tablet b.i.d. p.r.n.  10. Temazepam 30 mg at bedtime p.r.n.  11. Amlodipine 2.5 mg daily.  12. Imodium 2 mg every 6 p.r.n.  13. Zofran 4 mg every 8 p.r.n.  14. Hydrochlorothiazide-losartan 12.5/50 1 tablet daily.  15. Levaquin 750 mg every 48 hours.    DIET: Mechanical soft 2 grams sodium diet.   CONSULTATION: Physical therapy.   BRIEF SUMMARY OF HOSPITAL COURSE: Gina Lowe is a 69 year old Caucasian female well-known to our service from previous admissions with history of irritable bowel syndrome, hypertension, neuropathy, recurrent urinary tract infections, comes in with:  1.  Sepsis, which were secondary to pneumonia and  urinary tract infection. [name] blood cultures were negative. Urine cultures, gram-positive cocci. She had methicillin-resistant Staphylococcus aureus recently and now has pneumonia. She was started on vancomycin, Zosyn and Levaquin now changed to p.o. Levaquin.  White count is down to 8.5 at discharge, no fever. She is overall feeling better. Her saturations are 96% on room air.  2.  Acute renal failure from sepsis, hypotension and dehydration, resolved. Creatinine was 2.6 on admission, received IV fluids.  Creatinine is down to 1.1.  Ultrasound of the kidneys is negative.  3.  Hypotension, resolved.  Blood pressure medications resumed.  4.  Peripheral neuropathy on gabapentin and oxycodone p.r.n.  5.  Physical therapy recommended rehabilitation. The patient will go to [facility] today.  6.  Right lower extremity swelling, which is chronic.  Last month had an ultrasound Doppler to rule out deep vein thrombosis that was negative.  [name] right knee and right hip x-rays are negative for any fractures.  She recently had a fall.  7.  The patient was also initially noted to be on Eliquis. She denies any history of blood clots or atrial fibrillation.  I am assuming it was secondary to her knee surgery for deep vein thrombosis prophylaxis.  The surgery was in November.  She does not need any further deep vein thrombosis prophylaxis at present. Her Eliquis  has been discontinued.    8.  Diarrhea, improving p.r.n. Imodium has been prescribed.  Her Clostridium difficile is negative.   The discharge plan was discussed with the patient's son Marvis Repressaron Ferguson.  The patient; is a no code, DO NOT RESUSCITATE and she will go to rehabilitation today.     TIME SPENT: 40 minutes.     ____________________________ Wylie HailSona A. Allena KatzPatel, MD sap:DT D: 10/17/2014 12:20:03 ET T: 10/17/2014 12:46:27 ET JOB#: [id number]  cc: Joleigh Mineau A. Allena KatzPatel, MD, <Dictator> Willow OraSONA A Wael Maestas MD ELECTRONICALLY SIGNED 10/22/2014 11:54

## 2015-01-19 NOTE — Consult Note (Signed)
Chief Complaint:  Subjective/Chief Complaint seen for anemia, GI blood loss.  Patietn doing well s/p tfx.  no nausea or abdominal pain.   VITAL SIGNS/ANCILLARY NOTES: **Vital Signs.:   18-Feb-16 08:35  Vital Signs Type Routine  Temperature Temperature (F) 97  Celsius 36.1  Respirations Respirations 18  Systolic BP Systolic BP 665  Diastolic BP (mmHg) Diastolic BP (mmHg) 82  Mean BP 104  Pulse Ox % Pulse Ox % 96  Pulse Ox Activity Level  At rest  Oxygen Delivery Room Air/ 21 %   Brief Assessment:  Cardiac Regular   Respiratory clear BS   Gastrointestinal details normal Soft  No masses palpable  Bowel sounds normal   Lab Results: Routine Chem:  16-Feb-16 14:53   BUN  44  Creatinine (comp)  1.63  17-Feb-16 03:14   BUN  33  Creatinine (comp) 1.27  18-Feb-16 05:24   Glucose, Serum 81  BUN 16  Creatinine (comp) 0.88  Sodium, Serum 143  Potassium, Serum 3.5  Chloride, Serum  110  CO2, Serum 26  Calcium (Total), Serum 8.5  Anion Gap 7  Osmolality (calc) 285  eGFR (African American) >60  eGFR (Non-African American) >60 (eGFR values <13m/min/1.73 m2 may be an indication of chronic kidney disease (CKD). Calculated eGFR, using the MRDR Study equation, is useful in  patients with stable renal function. The eGFR calculation will not be reliable in acutely ill patients when serum creatinine is changing rapidly. It is not useful in patients on dialysis. The eGFR calculation may not be applicable to patients at the low and high extremes of body sizes, pregnant women, and vegetarians.)  Routine Hem:  16-Feb-16 14:53   Hemoglobin (CBC)  6.2  17-Feb-16 03:14   Hemoglobin (CBC)  8.5    15:44   Hemoglobin (CBC)  8.6 (Result(s) reported on 06 Nov 2014 at 04:11PM.)    22:30   Hemoglobin (CBC)  8.2 (Result(s) reported on 06 Nov 2014 at 11:12PM.)  18-Feb-16 05:24   WBC (CBC) 4.1  RBC (CBC)  3.16  Hemoglobin (CBC)  8.9  Hematocrit (CBC)  27.5  Platelet Count (CBC) 323  MCV  87  MCH 28.1  MCHC 32.3  RDW  15.6  Neutrophil % 61.0  Lymphocyte % 23.7  Monocyte % 8.5  Eosinophil % 6.4  Basophil % 0.4  Neutrophil # 2.5  Lymphocyte # 1.0  Monocyte # 0.4  Eosinophil # 0.3  Basophil # 0.0 (Result(s) reported on 07 Nov 2014 at 06:29AM.)   Assessment/Plan:  Assessment/Plan:  Assessment 1) anemia, heme positive stool. 2) Hypertension,  Anemia of chronic disease with baseline hemoglobin around 9, Peripheral neuropathy, Fibromyalgia Recurrent falls, Recurrent UTIs,  Chronic pain syndrome on high-dose narcotics, CKD, stage III.   Plan 1) egd today.  I have discussed the risks benefits and complicatiosn of proceedure to inculde nto limited to bleeding infection perforations and sedation and she wishes to proceed. Planning colonoscopy tomorrow.  Further recs to follow.   Electronic Signatures: SLoistine Simas(MD)  (Signed 18-Feb-16 13:47)  Authored: Chief Complaint, VITAL SIGNS/ANCILLARY NOTES, Brief Assessment, Lab Results, Assessment/Plan   Last Updated: 18-Feb-16 13:47 by SLoistine Simas(MD)

## 2015-01-19 NOTE — H&P (Signed)
PATIENT NAME:  Gina Lowe, Gina Lowe DATE OF BIRTH:  [birth year]  DATE OF ADMISSION:  11/05/2014  PRIMARY CARE PROVIDER: Lonie PeakNathan Conroy, MD  CHIEF COMPLAINT: Weakness, falls.   HISTORY OF PRESENTING ILLNESS: A 69 year old Caucasian female patient with history of hypertension, anemia of chronic disease on iron, peripheral neuropathy, fibromyalgia,  recurrent falls, and UTIs, presents to the Emergency Room with weakness, ongoing for about 3 weeks. The patient mentions that she has been feeling increasingly weak, has had recurrent falls. She was admitted to the hospital twice in the last 2 months with syncope, fall; the second time with UTI, pneumonia with sepsis, and symptoms have slowly worsened. Here in the Emergency Room, the patient is found to have worsening pneumonia and hemoglobin of 6.2 along with stool positive for blood, melena, and is being admitted to the hospitalist service.   The patient does not complain of any abdominal pain. No nausea or vomiting. No blood in her stool. Does mention that she has chronic melena due to her iron pills that she takes.   PAST MEDICAL HISTORY:  1.  Hypertension.  2.  Anemia of chronic disease with baseline hemoglobin around 9.  3.  Peripheral neuropathy.  4.  Fibromyalgia.  5.  Recurrent falls.  6.  Recurrent UTIs.  7.  Chronic pain syndrome on high-dose narcotics.  8.  CKD, stage III.   PAST SURGICAL HISTORY:  1.  Lower back surgery.  2.  Bilateral total knee replacement surgery in November 2015.   ALLERGIES: TO LISINOPRIL.   SOCIAL HISTORY: The patient lives home alone. Ambulates with a walker. Does not smoke. No alcohol.   HOME MEDICATIONS:  1.  Amlodipine 10 mg daily.  2.  Aspirin 81 mg daily.  3.  Atorvastatin 20 mg daily.  4.  Celecoxib 200 mg 2 capsules daily.  5.  Ferrous sulfate 325 mg oral 2 times a day.  6.  Gabapentin 800 mg 3 times a day.  7.  Hydrochlorothiazide/losartan 25/100 one tablet oral once a day.  8.   Zofran 4 mg every 8 hours as needed for nausea and vomiting.  9.  Oxycodone 20 mg 5 times a day as needed for pain.  10.  Temazepam 30 mg oral once a day at bedtime as needed for insomnia.  11.  The patient was on Eliquis after her knee surgery for DVT prophylaxis, which has been stopped.   REVIEW OF SYSTEMS:  CONSTITUTIONAL: Complains of fatigue, generalized weakness.  EYES: No blurred vision, pain, or redness.  EAR, NOSE, AND THROAT: No tinnitus, ear pain, hearing loss.  RESPIRATORY: No cough, wheeze, hemoptysis.  CARDIOVASCULAR: No chest pain, orthopnea, or edema.  GASTROINTESTINAL: No nausea, vomiting, diarrhea; has chronic melena from iron pills.  GENITOURINARY: No dysuria, hematuria, or frequency.  ENDOCRINE: No polyuria, nocturia, thyroid problems.  HEMATOLOGIC AND LYMPHATIC: Has chronic anemia.  INTEGUMENTARY: No acne, rash, lesion.  MUSCULOSKELETAL: Has chronic back pain and arthritic pain.  NEUROLOGIC: No focal numbness, weakness.  PSYCHIATRIC: No anxiety or depression.   PHYSICAL EXAMINATION:  VITAL SIGNS: Temperature 97.7, pulse of 78, blood pressure 121/64, saturating 92% on room air and 100% on 2 Lowe oxygen.  GENERAL: Moderately-built Caucasian female patient lying in bed, overall seems comfortable.  PSYCHIATRIC: Alert and oriented x 3, flat affect.  HEENT: Atraumatic, normocephalic. Oral mucosa dry and pink, pale. No icterus. Pupils bilaterally equal and reactive to light.  NECK: Supple. No thyromegaly. No palpable lymph nodes. Trachea midline. No carotid bruits. No  JVD.  CARDIOVASCULAR: S1, S2, without any murmurs. Peripheral pulses 2+. No edema.  RESPIRATORY: Normal work of breathing. Clear to auscultation on both sides.  GASTROINTESTINAL: Soft abdomen, nontender. Bowel signs present. No organomegaly palpable.  SKIN: Warm and dry. No petechiae, rash; pale.  MUSCULOSKELETAL: No joint swelling, redness, effusion of the large joints. Normal muscle tone.  NEUROLOGICAL: Motor  strength 5/5 in upper extremities. Gait not tested.  LYMPHATIC: No cervical lymphadenopathy.   LABORATORY AND RADIOGRAPHIC STUDIES:  1.  Glucose of 113, BUN 44, creatinine 1.63, sodium 137, potassium 4.2, chloride 102.  2.  Troponin less than 0.02.  3.  WBC 6.9, hemoglobin 6.2, platelets of 317,000 with MCV of 88, INR 1.  4.  Urinalysis shows no bacteria, no WBCs.  4.  Chest x-ray shows hiatal hernia, subsegmental atelectasis of both lung bases, nothing acute.   ASSESSMENT AND PLAN:  1.  Worsening anemia/anemia of chronic disease. [name] baseline hemoglobin is around 9; presently, her hemoglobin is 6.2. The patient does have melena, but this is chronic due to iron pills. Stool positive for blood, but she does not have any tachycardia or hypotension. The patient will be admitted onto medical floor. We will transfuse 2 units of packed red blood cells now. Hold the aspirin that she takes at home. Monitor for any worsening. Do serial hemoglobins. Consult gastrointestinal, as the patient will need endoscopic evaluation. At this point, is unclear if this is upper gastrointestinal bleed or lower gastrointestinal bleed.  2.  Acute renal failure/chronic kidney disease, stage 3 due to dehydration. We will put her on intravenous fluids. Repeat in the morning.  3.  Chronic pain syndrome. Continue home pain medications.  4.  Hypertension. We will continue her amlodipine, but hold the hydrochlorothiazide/losartan pills.  5.  Recurrent falls. This has been a chronic issue with her peripheral neuropathy. We will have physical therapy see the patient, may need rehabilitation.  6.  Deep vein thrombosis prophylaxis with sequential compression devices.   TIME SPENT TODAY ON THIS CASE: 45 minutes.    ____________________________ Molinda Bailiff Lavergne Hiltunen, MD srs:bm D: 11/05/2014 21:07:45 ET T: 11/05/2014 22:35:22 ET JOB#: [id number]  cc: Wardell Heath R. Joycelin Radloff, MD, <Dictator> Lonie Peak, MD Orie Fisherman  MD ELECTRONICALLY SIGNED 11/07/2014 16:28

## 2015-01-28 NOTE — Patient Outreach (Signed)
[facility]) Care Management  01/28/2015  Gina DesanctisGeraldine L Lowe May 03, 1946 [id number]   Referral from [facility], [name], RN assigCustomer service managerned.  Corrie MckusickLisa O. [facility] Care [name] Phone: [phone] Fax: [phone]

## 2015-02-05 ENCOUNTER — Other Ambulatory Visit: Payer: Self-pay

## 2015-02-05 NOTE — Patient Outreach (Signed)
[facility]) Care Management  02/05/2015  Gina DesanctisGeraldine L Lowe Aug 01, 1946 [id number]   SUBJECTIVE:  Telephone call to patient regarding primary MD referral.  HIPAA verified with patient.  Discussed and offered [facility] care [facility] services. Patient refused services at this time stating, " I feel like I am handling things ok at this time."  Patient states she followed up with her primary MD on 01/22/15 post hospital discharge.  Patient states her doctor discontinued her amlodipine and Restoril and started her on Trazodone.  Patient states she has not had any further episodes of syncope since discharge from the hospital. Patient states she checks her blood pressure daily but does not record.   Patient states she has a follow up appointment with cardiologist 02/21/15.  Patient states she is receiving [facility] 2 times per week. Patient reports she uses her walker for ambulation assistance.  Patient states she has a tenant that assists her daily with meals and home maker needs.   ASSESSMENT: Patient checking blood pressures but not recording. RNCM advised patient to record blood pressures and report to physicians at office visits.   PLAN:  RNCM will send patient [facility] Care [facility] contact information / brochure / and fall safety prevention information.  RNCM will forward patient to Gina PolioLisa Lowe to close due to refusal of services. RNCM will notify patients primary MD and Gina CourtLindsey Lowe with [facility] of patients refusal of services.   Gina InaDavina Travanti Mcmanus RN,BSN,CCM [facility] Telephonic Care Coordinator [phone]

## 2015-02-11 NOTE — Patient Outreach (Signed)
[facility]) Care Management  02/11/2015  Jim DesanctisGeraldine L Gharibian Jan 26, 1946 [id number]   Notification from [name], RN to close case due to refusal of [facility] Care [facility] services.  Corrie MckusickLisa O. Sharlee BlewMoore, AABA [facility] Care Management [facility] CM Assistant Phone: [phone] Fax: [phone]

## 2015-02-21 ENCOUNTER — Ambulatory Visit (INDEPENDENT_AMBULATORY_CARE_PROVIDER_SITE_OTHER): Payer: PPO | Admitting: Cardiology

## 2015-02-21 ENCOUNTER — Encounter: Payer: Self-pay | Admitting: Cardiology

## 2015-02-21 VITALS — BP 90/40 | HR 67 | Ht <= 58 in | Wt 151.4 lb

## 2015-02-21 DIAGNOSIS — I952 Hypotension due to drugs: Secondary | ICD-10-CM | POA: Diagnosis not present

## 2015-02-21 DIAGNOSIS — Y92009 Unspecified place in unspecified non-institutional (private) residence as the place of occurrence of the external cause: Secondary | ICD-10-CM

## 2015-02-21 DIAGNOSIS — W19XXXS Unspecified fall, sequela: Secondary | ICD-10-CM | POA: Diagnosis not present

## 2015-02-21 DIAGNOSIS — D649 Anemia, unspecified: Secondary | ICD-10-CM

## 2015-02-21 DIAGNOSIS — R55 Syncope and collapse: Secondary | ICD-10-CM

## 2015-02-21 NOTE — Patient Instructions (Signed)
Medication Instructions:  Please stop your Losartan and potassium. Continue all other medications as listed.  Follow-Up: Follow up as needed  Thank you for choosing [facility]!!

## 2015-02-21 NOTE — Progress Notes (Signed)
Cardiology Office Note   Date:  02/21/2015   ID:  Gina Lowe, DOB 1946-07-23, MRN [id number]  PCP:  Arlyss Queen  Cardiologist:   Donato Schultz, MD       History of Present Illness: Gina Lowe is a 69 y.o. female who presents for evaluation of syncope. Recently hospitalized and discharged on 01/15/15 after syncope, fall. Losartan 100 mg was stopped.  She is a history of recurrent syncope with falls, asymptomatic bradycardia, chronic back pain, fibromyalgia, depression, hypertension, anxiety. She did not remember what happened to cost her 2 fall to the floor and pass out. No prodrome. No seizure-like activity. No confusion after waking up for any significant amount of time. Usually drinking well. Appetite had been decreased mostly sitting at home watching television. No chest pain, no SOB. She thinks the OXYCODONE and amlodipine combination may have been causing her low blood pressure.  Since the hospitalization she has not had any further syncopal episodes. No chest pain, no shortness of breath. Her blood pressure today however is quite low.  Monitor was noted to have heart rates between 55 and 61. Thyroid levels checked suggestive of hyperthyroid versus sick euthyroid.   Past Medical History  Diagnosis Date  . Hypertension   . Depression   . Recurrent falls   . Anemia   . IBS (irritable bowel syndrome)   . Erosive esophagitis   . Diverticulosis   . Chronic back pain   . Chronic pain   . Chronic prescription opiate use   . High cholesterol   . GERD (gastroesophageal reflux disease)   . History of blood transfusion X 2    "related to when I passed out & when I was dehydrated"  . Syncope and collapse     "today is the 5th or 6th time in the last year" (01/14/2015)  . History of bleeding peptic ulcer   . History of hiatal hernia   . Osteoarthritis   . Arthritis     "knees" (01/14/2015)  . Fibromyalgia   . Carpal tunnel syndrome   . Peripheral  neuropathy     "hands" (01/14/2015)  . Chronic lower back pain     Past Surgical History  Procedure Laterality Date  . Total knee arthroplasty Left 2012  . Total knee arthroplasty Right 08/2014  . Bladder suspension    . Total knee arthroplasty Left   . Lumbar fusion  2007    Dr. Lovell Sheehan  . Laparoscopic cholecystectomy  1980's?  . Back surgery    . Joint replacement    . Abdominal hysterectomy  1970's  . Tubal ligation  1960's  . Bunionectomy Left 1970's?  . Spinal cord stimulator implant  ~ 2014/2015     Current Outpatient Prescriptions  Medication Sig Dispense Refill  . aspirin 81 MG chewable tablet Chew 81 mg by mouth daily.    Marland Kitchen gabapentin (NEURONTIN) 600 MG tablet Take 600 mg by mouth 3 (three) times daily.    Marland Kitchen ibuprofen (ADVIL,MOTRIN) 200 MG tablet Take 200 mg by mouth every 6 (six) hours as needed for moderate pain.    Marland Kitchen KLOR-CON M20 20 MEQ tablet Take 20 mEq by mouth daily.  11  . losartan-hydrochlorothiazide (HYZAAR) 100-25 MG per tablet Take 1 tablet by mouth daily.   0  . ondansetron (ZOFRAN ODT) 4 MG disintegrating tablet Take 1 tablet (4 mg total) by mouth every 8 (eight) hours as needed for nausea. 20 tablet 0  . oxymorphone (OPANA ER) 30  MG 12 hr tablet Take 30 mg by mouth every 12 (twelve) hours.    Marland Kitchen. PARoxetine (PAXIL) 20 MG tablet Take 20 mg by mouth every morning.    . temazepam (RESTORIL) 30 MG capsule Take 30 mg by mouth at bedtime.    . traZODone (DESYREL) 100 MG tablet Take 1 tablet by mouth at bedtime.  5   No current facility-administered medications for this visit.    Allergies:   Review of patient's allergies indicates no known allergies.    Social History:  The patient  reports that she has never smoked. She has never used smokeless tobacco. She reports that she does not drink alcohol or use illicit drugs.   Family History:  [name] family history includes Heart disease in her father; Hypertension in her mother; Stroke in her mother.     ROS:  Please see the history of present illness.   Otherwise, review of systems are positive for chronic pain. Denies any bleeding, orthopnea, chest pain, shortness of breath.   All other systems are reviewed and negative.    PHYSICAL EXAM: VS:  BP 90/40 mmHg  Pulse 67  Ht 4\' 10"  (1.473 m)  Wt 151 lb 6.4 oz (68.675 kg)  BMI 31.65 kg/m2  SpO2 95% , BMI Body mass index is 31.65 kg/(m^2). GEN: Well nourished, well developed, in no acute distress, ambulates with a walker HEENT: normal Neck: no JVD, carotid bruits, or masses Cardiac: RRR; no murmurs, rubs, or gallops,no edema  Respiratory:  clear to auscultation bilaterally, normal work of breathing GI: soft, nontender, nondistended, + BS MS: no deformity or atrophy Skin: warm and dry, no rash Neuro:  Strength and sensation are intact Psych: euthymic mood, full affect   EKG:  EKG is not ordered today. The ekg from 01/14/15 shows sinus rhythm heart rate 71 with nonspecific ST-T wave changes. Left axis deviation noted. Personally viewed. No change from prior EKG.  Telemetry noted to have sinus bradycardia at 56-61 bpm.   2D Echo: 01/15/15  Study Conclusions  - Left ventricle: The cavity size was normal. Wall thickness was increased in a pattern of mild LVH. The estimated ejection fraction was 60%. Wall motion was normal; there were no regional wall motion abnormalities. - Aortic valve: Sclerosis without stenosis. There was no significant regurgitation. - Right ventricle: The cavity size was normal. Systolic function was normal. - Pulmonary arteries: PA peak pressure: 38 mm Hg (S). Mildly elevated pulmonary pressures.      Recent Labs: 01/14/2015: ALT 11; Magnesium 2.4; TSH 0.138* 01/15/2015: BUN 52*; Creatinine 1.17*; Hemoglobin 8.9*; Platelets 221; Potassium 3.4*; Sodium 136    Lipid Panel No results found for: CHOL, TRIG, HDL, CHOLHDL, VLDL, LDLCALC, LDLDIRECT    Wt Readings from Last 3 Encounters:   02/21/15 151 lb 6.4 oz (68.675 kg)  01/14/15 145 lb 4.5 oz (65.9 kg)      Other studies Reviewed: Additional studies/ records that were reviewed today include: Review of hospital records, labs, EKGs, echocardiogram. Review of the above records demonstrates: As above   ASSESSMENT AND PLAN:  1.  Syncope-likely a result of hypotension/orthostatic hypotension which is precipitated by multitude of factors such as chronic pain medication as well as antihypertensives. In review of hospital discharge, they had stopped her losartan 100 mg however on her medication review today she has been taking losartan hydrochlorothiazide 100/25 mg. I will go ahead and discontinue losartan hydrochlorothiazide. Her blood pressure is 90 systolic. This certainly can result in syncopal episodes. Her  telemetry monitoring during the hospital stay was reassuring with no adverse arrhythmias. EKG is also reassuring. Echocardiogram shows normal pumping function with only mild increased pulmonary pressures. No further cardiovascular evaluation necessary at this time. If unexplained syncope occurs in the setting of highly normal blood pressure, one could consider event monitor. Since she is not going to be on HCTZ, I will stop potassium.  2. Anemia-hemoglobin  8.9. Continue to monitor through her physician.  3. Chronic pain-certainly medications can lead to hypotension. She states that she definitely needs her pain medication.  4. Recent acute kidney injury-creatinine upon admission to the hospital showed BUN of 64 creatinine of 2.04. This reduced to 52 and 1.1. Stopped losartan hydrochlorothiazide.  No further testing necessary at this time. Please let us know if we can be of further assistance. We will see her back on as-needed basis.  Multiple hospital records reviewed.  Mathews Robinsons, MD  02/21/2015 12:16 PM    [facility] [address] [place], [place], [place]  [address] Phone: [phone];  Fax: [phone]

## 2015-03-14 ENCOUNTER — Other Ambulatory Visit: Payer: Self-pay | Admitting: Family Medicine

## 2015-05-07 NOTE — Patient Outreach (Signed)
[facility]) Care Management  05/07/2015  Gina Lowe 10-04-1945 [id number]   Referral from HTA tier 4 list, assigned to [name], [facility] for patient outreach.  Jocie Meroney L. [name], AAS [name] Care Management Assistant

## 2015-05-14 ENCOUNTER — Other Ambulatory Visit: Payer: Self-pay

## 2015-05-14 NOTE — Patient Outreach (Signed)
[facility]) Care Management  05/14/2015  Gina Lowe May 08, 1946 [id number]   Notification received from [name], RNCM to close case due to patient refusing services.  Gina Lowe [name], AAS [facility] Care Management Assistant

## 2015-05-14 NOTE — Patient Outreach (Signed)
[facility]) Care Management  05/14/2015  Gina Lowe 1946/03/02 [id number]  Telephone call to patient regarding health team advantage referral.  Discussed and offered [facility] care management services to patient. Patient refused services and contact information.  PLAN:RNCM will refer patient to Erlinda Hong to close due to refusal of services.  RNCM will notify patients primary MD of refusal of services.   [name] RN,BSN,CCM [facility] Telephonic Care Coordinator [phone]

## 2015-06-18 ENCOUNTER — Emergency Department (HOSPITAL_COMMUNITY): Payer: PPO

## 2015-06-18 ENCOUNTER — Observation Stay (HOSPITAL_COMMUNITY): Payer: PPO

## 2015-06-18 ENCOUNTER — Observation Stay (HOSPITAL_COMMUNITY)
Admission: EM | Admit: 2015-06-18 | Discharge: 2015-06-19 | Disposition: A | Discharge status: Home Health | Payer: PPO | Attending: Student in an Organized Health Care Education/Training Program | Admitting: Student in an Organized Health Care Education/Training Program

## 2015-06-18 ENCOUNTER — Encounter (HOSPITAL_COMMUNITY): Payer: Self-pay | Admitting: *Deleted

## 2015-06-18 DIAGNOSIS — R200 Anesthesia of skin: Secondary | ICD-10-CM | POA: Diagnosis not present

## 2015-06-18 DIAGNOSIS — I10 Essential (primary) hypertension: Secondary | ICD-10-CM | POA: Insufficient documentation

## 2015-06-18 DIAGNOSIS — I639 Cerebral infarction, unspecified: Secondary | ICD-10-CM

## 2015-06-18 DIAGNOSIS — E785 Hyperlipidemia, unspecified: Secondary | ICD-10-CM | POA: Insufficient documentation

## 2015-06-18 DIAGNOSIS — G459 Transient cerebral ischemic attack, unspecified: Secondary | ICD-10-CM | POA: Diagnosis not present

## 2015-06-18 DIAGNOSIS — G629 Polyneuropathy, unspecified: Secondary | ICD-10-CM | POA: Diagnosis not present

## 2015-06-18 DIAGNOSIS — Z7982 Long term (current) use of aspirin: Secondary | ICD-10-CM | POA: Diagnosis not present

## 2015-06-18 DIAGNOSIS — Z96653 Presence of artificial knee joint, bilateral: Secondary | ICD-10-CM | POA: Diagnosis not present

## 2015-06-18 DIAGNOSIS — M545 Low back pain, unspecified: Secondary | ICD-10-CM | POA: Insufficient documentation

## 2015-06-18 DIAGNOSIS — R296 Repeated falls: Secondary | ICD-10-CM | POA: Insufficient documentation

## 2015-06-18 DIAGNOSIS — D509 Iron deficiency anemia, unspecified: Secondary | ICD-10-CM | POA: Insufficient documentation

## 2015-06-18 DIAGNOSIS — R4781 Slurred speech: Secondary | ICD-10-CM | POA: Insufficient documentation

## 2015-06-18 DIAGNOSIS — R531 Weakness: Secondary | ICD-10-CM | POA: Diagnosis not present

## 2015-06-18 DIAGNOSIS — E780 Pure hypercholesterolemia: Secondary | ICD-10-CM | POA: Diagnosis not present

## 2015-06-18 DIAGNOSIS — R32 Unspecified urinary incontinence: Secondary | ICD-10-CM | POA: Insufficient documentation

## 2015-06-18 DIAGNOSIS — F0390 Unspecified dementia without behavioral disturbance: Secondary | ICD-10-CM | POA: Diagnosis not present

## 2015-06-18 DIAGNOSIS — E875 Hyperkalemia: Secondary | ICD-10-CM | POA: Diagnosis not present

## 2015-06-18 DIAGNOSIS — F329 Major depressive disorder, single episode, unspecified: Secondary | ICD-10-CM | POA: Diagnosis not present

## 2015-06-18 DIAGNOSIS — G8929 Other chronic pain: Secondary | ICD-10-CM | POA: Diagnosis not present

## 2015-06-18 DIAGNOSIS — R269 Unspecified abnormalities of gait and mobility: Secondary | ICD-10-CM | POA: Insufficient documentation

## 2015-06-18 DIAGNOSIS — D649 Anemia, unspecified: Secondary | ICD-10-CM | POA: Insufficient documentation

## 2015-06-18 DIAGNOSIS — R270 Ataxia, unspecified: Secondary | ICD-10-CM | POA: Insufficient documentation

## 2015-06-18 DIAGNOSIS — R2681 Unsteadiness on feet: Secondary | ICD-10-CM | POA: Insufficient documentation

## 2015-06-18 DIAGNOSIS — R4701 Aphasia: Secondary | ICD-10-CM | POA: Diagnosis not present

## 2015-06-18 LAB — URINALYSIS, ROUTINE W REFLEX MICROSCOPIC
BILIRUBIN URINE: NEGATIVE
GLUCOSE, UA: NEGATIVE mg/dL
HGB URINE DIPSTICK: NEGATIVE
Ketones, ur: NEGATIVE mg/dL
Leukocytes, UA: NEGATIVE
Nitrite: NEGATIVE
PH: 5 (ref 5.0–8.0)
Protein, ur: NEGATIVE mg/dL
SPECIFIC GRAVITY, URINE: 1.018 (ref 1.005–1.030)
UROBILINOGEN UA: 0.2 mg/dL (ref 0.0–1.0)

## 2015-06-18 LAB — COMPREHENSIVE METABOLIC PANEL
ALBUMIN: 3.6 g/dL (ref 3.5–5.0)
ALK PHOS: 72 U/L (ref 38–126)
ALT: 16 U/L (ref 14–54)
AST: 23 U/L (ref 15–41)
Anion gap: 7 (ref 5–15)
BILIRUBIN TOTAL: 0.5 mg/dL (ref 0.3–1.2)
BUN: 30 mg/dL — AB (ref 6–20)
CALCIUM: 9.3 mg/dL (ref 8.9–10.3)
CO2: 26 mmol/L (ref 22–32)
Chloride: 103 mmol/L (ref 101–111)
Creatinine, Ser: 1.02 mg/dL — ABNORMAL HIGH (ref 0.44–1.00)
GFR calc Af Amer: >60 mL/min (ref >60)
GFR calc non Af Amer: 54 mL/min — ABNORMAL LOW (ref >60)
GLUCOSE: 93 mg/dL (ref 65–99)
POTASSIUM: 5.7 mmol/L — AB (ref 3.5–5.1)
Sodium: 136 mmol/L (ref 135–145)
TOTAL PROTEIN: 6.3 g/dL — AB (ref 6.5–8.1)

## 2015-06-18 LAB — RAPID URINE DRUG SCREEN, HOSP PERFORMED
AMPHETAMINES: NOT DETECTED
BENZODIAZEPINES: POSITIVE — AB
Barbiturates: NOT DETECTED
Cocaine: NOT DETECTED
OPIATES: POSITIVE — AB
TETRAHYDROCANNABINOL: NOT DETECTED

## 2015-06-18 LAB — LIPID PANEL
CHOL/HDL RATIO: 3.2 ratio
CHOLESTEROL: 174 mg/dL (ref 0–200)
HDL: 54 mg/dL (ref >40)
LDL Cholesterol: 102 mg/dL — ABNORMAL HIGH (ref 0–99)
Triglycerides: 92 mg/dL (ref <150)
VLDL: 18 mg/dL (ref 0–40)

## 2015-06-18 LAB — RETICULOCYTES
RBC.: 3.73 MIL/uL — AB (ref 3.87–5.11)
RETIC CT PCT: 0.8 % (ref 0.4–3.1)
Retic Count, Absolute: 29.8 10*3/uL (ref 19.0–186.0)

## 2015-06-18 LAB — TROPONIN I: Troponin I: <0.03 ng/mL (ref <0.031)

## 2015-06-18 LAB — CBC
HEMATOCRIT: 31.6 % — AB (ref 36.0–46.0)
Hemoglobin: 9.4 g/dL — ABNORMAL LOW (ref 12.0–15.0)
MCH: 25.2 pg — ABNORMAL LOW (ref 26.0–34.0)
MCHC: 29.7 g/dL — AB (ref 30.0–36.0)
MCV: 84.7 fL (ref 78.0–100.0)
Platelets: 252 10*3/uL (ref 150–400)
RBC: 3.73 MIL/uL — ABNORMAL LOW (ref 3.87–5.11)
RDW: 16.2 % — AB (ref 11.5–15.5)
WBC: 9.4 10*3/uL (ref 4.0–10.5)

## 2015-06-18 LAB — BASIC METABOLIC PANEL
ANION GAP: 8 (ref 5–15)
BUN: 22 mg/dL — ABNORMAL HIGH (ref 6–20)
CALCIUM: 9 mg/dL (ref 8.9–10.3)
CO2: 27 mmol/L (ref 22–32)
CREATININE: 0.93 mg/dL (ref 0.44–1.00)
Chloride: 101 mmol/L (ref 101–111)
GFR calc non Af Amer: >60 mL/min (ref >60)
Glucose, Bld: 97 mg/dL (ref 65–99)
Potassium: 4.3 mmol/L (ref 3.5–5.1)
SODIUM: 136 mmol/L (ref 135–145)

## 2015-06-18 LAB — VITAMIN B12: Vitamin B-12: 254 pg/mL (ref 180–914)

## 2015-06-18 LAB — APTT: aPTT: 27 seconds (ref 24–37)

## 2015-06-18 LAB — PROTIME-INR
INR: 0.97 (ref 0.00–1.49)
Prothrombin Time: 13 seconds (ref 11.6–15.2)

## 2015-06-18 LAB — IRON AND TIBC
Iron: 26 ug/dL — ABNORMAL LOW (ref 28–170)
SATURATION RATIOS: 6 % — AB (ref 10.4–31.8)
TIBC: 410 ug/dL (ref 250–450)
UIBC: 384 ug/dL

## 2015-06-18 LAB — TSH: TSH: 1.092 u[IU]/mL (ref 0.350–4.500)

## 2015-06-18 LAB — ETHANOL

## 2015-06-18 LAB — FERRITIN: FERRITIN: 8 ng/mL — AB (ref 11–307)

## 2015-06-18 LAB — MAGNESIUM: MAGNESIUM: 1.9 mg/dL (ref 1.7–2.4)

## 2015-06-18 LAB — FOLATE: Folate: 17.6 ng/mL (ref >5.9)

## 2015-06-18 MED ORDER — SODIUM CHLORIDE 0.9 % IV BOLUS (SEPSIS): 500.0000 mL | Freq: Once | INTRAVENOUS | Status: DC

## 2015-06-18 MED ORDER — STROKE: EARLY STAGES OF RECOVERY BOOK
Freq: Once | Status: DC
  Filled 2015-06-18: qty 1

## 2015-06-18 MED ORDER — OXYCODONE HCL 5 MG PO TABS
20.0000 mg | ORAL_TABLET | Freq: Every day | ORAL | Status: DC | PRN
  Administered 2015-06-18 – 2015-06-19 (×2): 20 mg via ORAL
  Filled 2015-06-18 (×2): qty 4

## 2015-06-18 MED ORDER — ENOXAPARIN SODIUM 40 MG/0.4ML ~~LOC~~ SOLN
40.0000 mg | SUBCUTANEOUS | Status: DC
  Administered 2015-06-18: 40 mg via SUBCUTANEOUS
  Filled 2015-06-18: qty 0.4

## 2015-06-18 MED ORDER — ASPIRIN 325 MG PO TABS
325.0000 mg | ORAL_TABLET | Freq: Once | ORAL | Status: AC
  Administered 2015-06-18: 325 mg via ORAL
  Filled 2015-06-18: qty 1

## 2015-06-18 MED ORDER — TRAZODONE HCL 100 MG PO TABS
100.0000 mg | ORAL_TABLET | Freq: Every day | ORAL | Status: DC
  Administered 2015-06-18: 100 mg via ORAL
  Filled 2015-06-18: qty 1

## 2015-06-18 MED ORDER — SENNOSIDES-DOCUSATE SODIUM 8.6-50 MG PO TABS: 1.0000 | ORAL_TABLET | Freq: Every evening | ORAL | Status: DC | PRN

## 2015-06-18 MED ORDER — SODIUM CHLORIDE 0.9 % IV SOLN: INTRAVENOUS | Status: AC

## 2015-06-18 MED ORDER — SODIUM CHLORIDE 0.9 % IV SOLN
100.0000 mL/h | INTRAVENOUS | Status: DC
  Administered 2015-06-18 – 2015-06-19 (×2): 100 mL/h via INTRAVENOUS

## 2015-06-18 MED ORDER — FUROSEMIDE 10 MG/ML IJ SOLN
20.0000 mg | Freq: Once | INTRAMUSCULAR | Status: AC
  Administered 2015-06-18: 20 mg via INTRAVENOUS
  Filled 2015-06-18: qty 2

## 2015-06-18 MED ORDER — IOHEXOL 350 MG/ML SOLN
50.0000 mL | Freq: Once | INTRAVENOUS | Status: AC | PRN
  Administered 2015-06-18: 50 mL via INTRAVENOUS

## 2015-06-18 MED ORDER — SODIUM CHLORIDE 0.9 % IV SOLN
INTRAVENOUS | Status: AC
  Administered 2015-06-18: 19:00:00 via INTRAVENOUS

## 2015-06-18 MED ORDER — OXYBUTYNIN CHLORIDE ER 10 MG PO TB24
10.0000 mg | ORAL_TABLET | Freq: Every day | ORAL | Status: DC
  Administered 2015-06-18: 10 mg via ORAL
  Filled 2015-06-18 (×2): qty 1

## 2015-06-18 MED ORDER — PAROXETINE HCL 30 MG PO TABS
30.0000 mg | ORAL_TABLET | Freq: Every day | ORAL | Status: DC
Start: 2015-06-18 — End: 2015-06-19
  Administered 2015-06-19: 30 mg via ORAL
  Filled 2015-06-18 (×2): qty 1

## 2015-06-18 MED ORDER — PREGABALIN 75 MG PO CAPS
150.0000 mg | ORAL_CAPSULE | Freq: Two times a day (BID) | ORAL | Status: DC
  Administered 2015-06-18 – 2015-06-19 (×2): 150 mg via ORAL
  Filled 2015-06-18 (×2): qty 2

## 2015-06-18 MED ORDER — ASPIRIN 300 MG RE SUPP: 300.0000 mg | Freq: Once | RECTAL | Status: AC

## 2015-06-18 MED ORDER — SODIUM CHLORIDE 0.9 % IV BOLUS (SEPSIS)
500.0000 mL | Freq: Once | INTRAVENOUS | Status: AC
  Administered 2015-06-18: 500 mL via INTRAVENOUS

## 2015-06-18 NOTE — ED Notes (Signed)
Admitting MD at bedside.

## 2015-06-18 NOTE — ED Notes (Signed)
Spinal stimulator card given to MRI tech to make a copy

## 2015-06-18 NOTE — ED Notes (Signed)
Pt reports frequent falls over past week and having fatigue. Pt is alert and answering questions appropriately at triage, right grip weaker at triage.  ekg done. Airway intact.

## 2015-06-18 NOTE — Progress Notes (Signed)
Patient admitted to [phone]. Patient is alert and oriented x4, appropriate cognition, speech slightly slurred, noted drift and ataxia in right arm. Patient states she has baseline numbness and tingling in bilateral upper extremities from neuropathy. No skin issues or edema noted. Q2 vitals, neuro assessment began, patient placed on tele box #2, patient oriented to room, unit, staff. Call bell within patient's reach, bed alarm on, safety measures in place.

## 2015-06-18 NOTE — H&P (Signed)
Date: 06/18/2015               Patient Name:  Gina Lowe MRN: [id number]  DOB: [birth year] Age / Sex: 69 y.o., female   PCP: Lonie Peak, PA-C         Medical Service: Internal Medicine Teaching Service         Attending Physician: Dr. Tyson Alias, MD    First Contact: Dr. Loney Loh Pager: [phone]  Second Contact: Dr. Johna Roles Pager: [phone]       After Hours (After 5p/  First Contact Pager: [phone]  weekends / holidays): Second Contact Pager: [phone]   Chief Complaint: Fall, balance problems  History of Present Illness: Patient is a 68 year old female with a past medical history of recurrent falls, syncope and collapse, GERD, erosive esophagitis, peripheral neuropathy and chronic pain presenting to [facility] after 2 recent falls. Patient was accompanied by her daughter-in-law who gave the history. Stated patient was found on Monday lying on her bathroom floor and had hit her head against a metal stand in the bathroom. It was not clear how long the patient was lying on the bathroom floor. At the time patient did not seek medical attention. Stated patient was found on the bathroom floor last night again by her son who thought her speech was slurred at the time. States the patient lives by herself and a neighbor helps her with her daily chores. Denies noticing any facial droop. As per patient, her right side feels weak since yesterday. Patient also reports having numbness and tingling in her hands but states that is chronic because she has peripheral neuropathy. Patient does not remember falling or losing consciousness. Patient denies any history of seizures or stroke in the past. She is currently taking aspirin 81 mg daily. In addition, she takes amlodipine for blood pressure. Patient states she has chronic balance problems and has seen physical therapy before. States she feels lightheaded when getting up fast from her bed.   Meds: Current Facility-Administered  Medications  Medication Dose Route Frequency Provider Last Rate Last Dose  .  stroke: mapping our early stages of recovery book   Does not apply Once Marjan Rabbani, MD      . 0.9 %  sodium chloride infusion  100 mL/hr Intravenous Continuous Gerhard Munch, MD 100 mL/hr at 06/18/15 1205 100 mL/hr at 06/18/15 1205  . 0.9 %  sodium chloride infusion   Intravenous STAT Gerhard Munch, MD      . 0.9 %  sodium chloride infusion   Intravenous Continuous Otis Brace, MD 100 mL/hr at 06/18/15 1834    . enoxaparin (LOVENOX) injection 40 mg  40 mg Subcutaneous Q24H Marjan Rabbani, MD   40 mg at 06/18/15 1834  . oxybutynin (DITROPAN-XL) 24 hr tablet 10 mg  10 mg Oral QHS Marjan Rabbani, MD      . oxyCODONE (Oxy IR/ROXICODONE) immediate release tablet 20 mg  20 mg Oral 5 X Daily PRN Marjan Rabbani, MD      . PARoxetine (PAXIL) tablet 30 mg  30 mg Oral Daily Marjan Rabbani, MD      . pregabalin (LYRICA) capsule 150 mg  150 mg Oral BID Marjan Rabbani, MD      . senna-docusate (Senokot-S) tablet 1 tablet  1 tablet Oral QHS PRN Marjan Rabbani, MD      . traZODone (DESYREL) tablet 100 mg  100 mg Oral QHS Otis Brace, MD        Allergies: Allergies as  of 06/18/2015  . (No Known Allergies)   Past Medical History  Diagnosis Date  . Hypertension   . Depression   . Recurrent falls   . Anemia   . IBS (irritable bowel syndrome)   . Erosive esophagitis   . Diverticulosis   . Chronic back pain   . Chronic pain   . Chronic prescription opiate use   . High cholesterol   . GERD (gastroesophageal reflux disease)   . History of blood transfusion X 2    "related to when I passed out & when I was dehydrated"  . Syncope and collapse     "today is the 5th or 6th time in the last year" (01/14/2015)  . History of bleeding peptic ulcer   . History of hiatal hernia   . Osteoarthritis   . Arthritis     "knees" (01/14/2015)  . Fibromyalgia   . Carpal tunnel syndrome   . Peripheral neuropathy     "hands"  (01/14/2015)  . Chronic lower back pain    Past Surgical History  Procedure Laterality Date  . Total knee arthroplasty Left 2012  . Total knee arthroplasty Right 08/2014  . Bladder suspension    . Total knee arthroplasty Left   . Lumbar fusion  2007    Dr. Lovell Sheehan  . Laparoscopic cholecystectomy  1980's?  . Back surgery    . Joint replacement    . Abdominal hysterectomy  1970's  . Tubal ligation  1960's  . Bunionectomy Left 1970's?  . Spinal cord stimulator implant  ~ 2014/2015   Family History  Problem Relation Age of Onset  . Hypertension Mother   . Stroke Mother   . Heart disease Father    Social History   Social History  . Marital Status: Widowed    Spouse Name: N/A  . Number of Children: N/A  . Years of Education: N/A   Occupational History  . Not on file.   Social History Main Topics  . Smoking status: Never Smoker   . Smokeless tobacco: Never Used  . Alcohol Use: No  . Drug Use: No  . Sexual Activity: Not Currently   Other Topics Concern  . Not on file   Social History Narrative    Review of Systems: Review of Systems  Constitutional: Negative for fever, chills and malaise/fatigue.  HENT: Negative for ear pain.   Eyes: Negative for blurred vision, double vision and pain.  Respiratory: Negative for cough, sputum production, shortness of breath and wheezing.   Cardiovascular: Negative for chest pain, palpitations and leg swelling.  Gastrointestinal: Negative for nausea, vomiting, abdominal pain, diarrhea, constipation, blood in stool and melena.  Genitourinary: Negative for dysuria, urgency, frequency and hematuria.  Musculoskeletal: Positive for falls.  Skin: Negative for itching and rash.  Neurological: Positive for dizziness and focal weakness. Negative for headaches.    Physical Exam: Blood pressure 116/67, pulse 77, temperature 98.6 F (37 C), temperature source Oral, resp. rate 16, height  (1.499 m), weight 135 lb (61.236 kg), SpO2 95  %. Physical Exam  Constitutional: She is oriented to person, place, and time. She appears well-developed and well-nourished. No distress.  HENT:  Head: Normocephalic and atraumatic.  Mouth/Throat: Oropharynx is clear and moist.  Eyes: EOM are normal. Pupils are equal, round, and reactive to light.  Neck: Neck supple. No tracheal deviation present.  Cardiovascular: Normal rate, regular rhythm and intact distal pulses.  Exam reveals no gallop and no friction rub.   No murmur  heard. Pulmonary/Chest: Effort normal and breath sounds normal. No respiratory distress. She has no wheezes. She has no rales.  Abdominal: Soft. Bowel sounds are normal. She exhibits no distension. There is no tenderness.  Musculoskeletal: She exhibits no edema.  Neurological: She is alert and oriented to person, place, and time.  CN II-XII grossly intact Sensation grossly intact in bilateral upper and lower extremities Strength 5/5 in bilateral upper and lower extremities.    Skin: Skin is warm and dry.    Lab results: Basic Metabolic Panel:  Recent Labs  16/10/96 1138  NA 136  K 5.7*  CL 103  CO2 26  GLUCOSE 93  BUN 30*  CREATININE 1.02*  CALCIUM 9.3   Liver Function Tests:  Recent Labs  06/18/15 1138  AST 23  ALT 16  ALKPHOS 72  BILITOT 0.5  PROT 6.3*  ALBUMIN 3.6   CBC:  Recent Labs  06/18/15 1138  WBC 9.4  HGB 9.4*  HCT 31.6*  MCV 84.7  PLT 252   Cardiac Enzymes:  Recent Labs  06/18/15 1140  TROPONINI <0.03   Fasting Lipid Panel:  Recent Labs  06/18/15 1125  CHOL 174  HDL 54  LDLCALC 102*  TRIG 92  CHOLHDL 3.2   Thyroid Function Tests:  Recent Labs  06/18/15 1125  TSH 1.092   Anemia Panel:  Recent Labs  06/18/15 1951  RETICCTPCT 0.8   Coagulation:  Recent Labs  06/18/15 1140  LABPROT 13.0  INR 0.97   Urine Drug Screen: Drugs of Abuse     Component Value Date/Time   LABOPIA POSITIVE* 06/18/2015 1318   COCAINSCRNUR NONE DETECTED 06/18/2015  1318   LABBENZ POSITIVE* 06/18/2015 1318   AMPHETMU NONE DETECTED 06/18/2015 1318   THCU NONE DETECTED 06/18/2015 1318   LABBARB NONE DETECTED 06/18/2015 1318    Alcohol Level:  Recent Labs  06/18/15 1139  ETH <5   Urinalysis:  Recent Labs  06/18/15 1318  COLORURINE YELLOW  LABSPEC 1.018  PHURINE 5.0  GLUCOSEU NEGATIVE  HGBUR NEGATIVE  BILIRUBINUR NEGATIVE  KETONESUR NEGATIVE  PROTEINUR NEGATIVE  UROBILINOGEN 0.2  NITRITE NEGATIVE  LEUKOCYTESUR NEGATIVE   Imaging results:  Dg Chest 2 View  06/18/2015   CLINICAL DATA:  Chest pain for 1 day  EXAM: CHEST  2 VIEW  COMPARISON:  January 14, 2015  FINDINGS: The heart size and mediastinal contours are within normal limits. Both lungs are clear. Spinal stimulator leads are identified. There is a hiatal hernia. Surgical clips are identified in right upper quadrant consistent with prior cholecystectomy.  IMPRESSION: No active cardiopulmonary disease.   Electronically Signed   By: Sherian Rein M.D.   On: 06/18/2015 18:58   Ct Head Wo Contrast  06/18/2015   CLINICAL DATA:  Frequent falls over past week. Generalized weakness.  EXAM: CT HEAD WITHOUT CONTRAST  TECHNIQUE: Contiguous axial images were obtained from the base of the skull through the vertex without intravenous contrast.  COMPARISON:  01/14/2015  FINDINGS: Dilated perivascular space in the left lower basal ganglia, stable. No acute intracranial abnormality. Specifically, no hemorrhage, hydrocephalus, mass lesion, acute infarction, or significant intracranial injury. No acute calvarial abnormality. Visualized paranasal sinuses and mastoids clear. Orbital soft tissues unremarkable.  IMPRESSION: No acute intracranial abnormality.   Electronically Signed   By: Charlett Nose M.D.   On: 06/18/2015 13:03    Other results: EAV:WUJWJX sinus rhythm, LVH  Assessment & Plan by Problem: Principal Problem:   Aphasia  TIA Patient experienced transient slurring of  speech and right-sided motor  weakness. Symptoms have now resolved- speech normal and strength 5 out of 5 in bilateral upper and lower extremities. CT of head did not show any acute intracranial abnormality. Patient is currently taking aspirin 81 mg at home. Lipid panel showing cholesterol 174 and LDL 102. -Normal saline at 100 mL per hour -Aspirin 325 mg daily -Consider starting patient on a statin -Pending CTA of head -Pending CTA of neck -Carotid Doppler -Echo -Pending labs: A1c, BMP in a.m., CBC in a.m., HIV, hep C -Pending EKG in a.m. -Frequent neuro checks  Recurrent falls Blood pressure stable.  -Orthostatic vitals -Pending B12 -PT and OT  Normocytic Anemia Hemoglobin 9.4, MCV 84.7.  -Pending ferritin, iron, TIBC, folate  Hyperkalemia Potassium 5.7 today. Patient reported taking potassium supplement at home. She was advised to stop taking the supplement.   Depression -Continue Trazodone 100 mg daily -Continue paroxetine 30 mg daily  Peripheral neuropathy -Continue Pregabalin 150 mg twice daily  Chronic pain -Continue oxycodone 20 mg 5 times daily as needed  Urinary incontinence -Continue oxybutynin 10 mg daily  DVT prophylaxis: Lovenox  Diet: Heart healthy  Code: FULL  Dispo: Disposition is deferred at this time, awaiting improvement of current medical problems. Anticipated discharge in approximately 1-2 day(s).   The patient does have a current PCP Lonie Peak, PA-C) and does need an [facility] hospital follow-up appointment after discharge.  The patient does not have transportation limitations that hinder transportation to clinic appointments.  Signed: John Giovanni, MD 06/18/2015, 8:23 PM

## 2015-06-18 NOTE — ED Provider Notes (Signed)
CSN: [id number]     Arrival date & time 06/18/15  1035 History   First MD Initiated Contact with Patient 06/18/15 1102     Chief Complaint  Patient presents with  . Fall  . Altered Mental Status     HPI  Patient presents with concerns of increasing frequency of falls, as well as new speech difficulty, delayed speech. Patient is here with her family member who provides much of the history of present illness. The patient herself complains of generalized weakness, denies asymmetric weakness. She states that she has difficulty with word recall, feels her speech to be slow, denies nausea, hip pain. No new medication changes. According to the family member, over the past week patient has had increasingly frequent falls, including one yesterday that resulted in substantial damage. Today, patient seems to have new speech deficits, without new falling. No new medication changes, diet changes, activity change.   Past Medical History  Diagnosis Date  . Hypertension   . Depression   . Recurrent falls   . Anemia   . IBS (irritable bowel syndrome)   . Erosive esophagitis   . Diverticulosis   . Chronic back pain   . Chronic pain   . Chronic prescription opiate use   . High cholesterol   . GERD (gastroesophageal reflux disease)   . History of blood transfusion X 2    "related to when I passed out & when I was dehydrated"  . Syncope and collapse     "today is the 5th or 6th time in the last year" (01/14/2015)  . History of bleeding peptic ulcer   . History of hiatal hernia   . Osteoarthritis   . Arthritis     "knees" (01/14/2015)  . Fibromyalgia   . Carpal tunnel syndrome   . Peripheral neuropathy     "hands" (01/14/2015)  . Chronic lower back pain    Past Surgical History  Procedure Laterality Date  . Total knee arthroplasty Left 2012  . Total knee arthroplasty Right 08/2014  . Bladder suspension    . Total knee arthroplasty Left   . Lumbar fusion  2007    Dr. Lovell Sheehan  .  Laparoscopic cholecystectomy  1980's?  . Back surgery    . Joint replacement    . Abdominal hysterectomy  1970's  . Tubal ligation  1960's  . Bunionectomy Left 1970's?  . Spinal cord stimulator implant  ~ 2014/2015   Family History  Problem Relation Age of Onset  . Hypertension Mother   . Stroke Mother   . Heart disease Father    Social History  Substance Use Topics  . Smoking status: Never Smoker   . Smokeless tobacco: Never Used  . Alcohol Use: No   OB History    No data available     Review of Systems  Constitutional:       Per HPI, otherwise negative  HENT:       Per HPI, otherwise negative  Respiratory:       Per HPI, otherwise negative  Cardiovascular:       Per HPI, otherwise negative  Gastrointestinal: Negative for vomiting.  Endocrine:       Negative aside from HPI  Genitourinary:       Neg aside from HPI   Musculoskeletal:       Per HPI, otherwise negative  Skin: Negative for wound.  Neurological: Positive for weakness. Negative for syncope.      Allergies  Review of patient's allergies  indicates no known allergies.  Home Medications   Prior to Admission medications   Medication Sig Start Date End Date Taking? Authorizing Provider  amLODipine (NORVASC) 10 MG tablet Take 10 mg by mouth daily.   Yes Historical Provider, MD  aspirin EC 81 MG tablet Take 81 mg by mouth daily.   Yes Historical Provider, MD  ondansetron (ZOFRAN) 8 MG tablet Take 8 mg by mouth every 8 (eight) hours as needed for nausea or vomiting.   Yes Historical Provider, MD  oxybutynin (DITROPAN-XL) 10 MG 24 hr tablet Take 10 mg by mouth at bedtime.   Yes Historical Provider, MD  Oxycodone HCl 20 MG TABS Take 1 tablet by mouth 5 (five) times daily as needed (pain).   Yes Historical Provider, MD  PARoxetine (PAXIL) 30 MG tablet Take 30 mg by mouth daily.   Yes Historical Provider, MD  potassium chloride SA (K-DUR,KLOR-CON) 20 MEQ tablet Take 20 mEq by mouth daily.   Yes Historical  Provider, MD  pregabalin (LYRICA) 150 MG capsule Take 150 mg by mouth 2 (two) times daily.   Yes Historical Provider, MD  PRESCRIPTION MEDICATION Take 1 tablet by mouth daily as needed (stomach).   Yes Historical Provider, MD  traZODone (DESYREL) 100 MG tablet Take 1 tablet by mouth at bedtime. 02/07/15  Yes Historical Provider, MD   BP 147/101 mmHg  Pulse 81  Temp(Src) 99.2 F (37.3 C) (Oral)  Resp 23  Ht  (1.499 m)  Wt 135 lb (61.236 kg)  BMI 27.25 kg/m2  SpO2 91% Physical Exam  Constitutional: She is oriented to person, place, and time. She appears well-nourished. She appears ill. No distress.  HENT:  Head: Normocephalic and atraumatic.  Eyes: Conjunctivae and EOM are normal.  Cardiovascular: Normal rate and regular rhythm.   Pulmonary/Chest: Effort normal and breath sounds normal. No stridor. No respiratory distress.  Abdominal: She exhibits no distension.  Musculoskeletal: She exhibits no edema.  Neurological: She is alert and oriented to person, place, and time. She displays no atrophy and no tremor. No cranial nerve deficit or sensory deficit. She exhibits normal muscle tone. She displays no seizure activity. Coordination normal.  Speech is slow, consistent, delayed, but without obvious slurring  Skin: Skin is warm and dry.  Psychiatric: She has a normal mood and affect.  Nursing note and vitals reviewed.   ED Course  Procedures (including critical care time) Labs Review Labs Reviewed  COMPREHENSIVE METABOLIC PANEL - Abnormal; Notable for the following:    Potassium 5.7 (*)    BUN 30 (*)    Creatinine, Ser 1.02 (*)    Total Protein 6.3 (*)    GFR calc non Af Amer 54 (*)    All other components within normal limits  CBC - Abnormal; Notable for the following:    RBC 3.73 (*)    Hemoglobin 9.4 (*)    HCT 31.6 (*)    MCH 25.2 (*)    MCHC 29.7 (*)    RDW 16.2 (*)    All other components within normal limits  URINE RAPID DRUG SCREEN, HOSP PERFORMED - Abnormal;  Notable for the following:    Opiates POSITIVE (*)    Benzodiazepines POSITIVE (*)    All other components within normal limits  APTT  ETHANOL  TROPONIN I  PROTIME-INR  URINALYSIS, ROUTINE W REFLEX MICROSCOPIC (NOT AT [facility])  CBG MONITORING, ED    Imaging Review Ct Head Wo Contrast  06/18/2015   CLINICAL DATA:  Frequent falls  over past week. Generalized weakness.  EXAM: CT HEAD WITHOUT CONTRAST  TECHNIQUE: Contiguous axial images were obtained from the base of the skull through the vertex without intravenous contrast.  COMPARISON:  01/14/2015  FINDINGS: Dilated perivascular space in the left lower basal ganglia, stable. No acute intracranial abnormality. Specifically, no hemorrhage, hydrocephalus, mass lesion, acute infarction, or significant intracranial injury. No acute calvarial abnormality. Visualized paranasal sinuses and mastoids clear. Orbital soft tissues unremarkable.  IMPRESSION: No acute intracranial abnormality.   Electronically Signed   By: Charlett Nose M.D.   On: 06/18/2015 13:03   I have personally reviewed and evaluated these images and lab results as part of my medical decision-making.   EKG Interpretation   Date/Time:  Wednesday June 18 2015 10:49:04 EDT Ventricular Rate:  77 PR Interval:  140 QRS Duration: 82 QT Interval:  358 QTC Calculation: 405 R Axis:   -20 Text Interpretation:  Normal sinus rhythm Left ventricular hypertrophy  Abnormal ECG Sinus rhythm Left axis deviation Left ventricular hypertrophy  No significant change since last tracing Abnormal ekg Confirmed by  Gerhard Munch  MD ([id number]) on 06/18/2015 11:35:14 AM     Pulse oximetry 99% room air normal Cardiac 80 sinus normal   Initial labs notable for hyperkalemia. NS provided, Lasix provided.  CT wnl, but w aphasia, MRI is ordered.  On repeat exam the patient remains in no distress. Patient has a implanted nerve stimulator, and it is unclear if MRI is possible. I discussed her case with  our neurology colleagues, and given concern for analgesia, she will be admitted.  MDM   Final diagnoses:  Aphasia   Elderly female presents with weakness, frequent falls, and aphasia. Patient's initial evaluation is reassuring, with concern for persistent aphasia, she was admitted for further evaluation and management.   Gerhard Munch, MD 06/18/15 [phone]

## 2015-06-18 NOTE — Consult Note (Signed)
Referring Physician: Oswaldo Done    Chief Complaint: aphasia  HPI:                                                                                                                                         Gina Lowe is an 69 y.o. female who has been having difficulty with her gait over the last few months.  She has been suffering from multiple falls. On Monday her son found e in the bathroom after a fall. The fall was large enough to creat a dent in a metal foot stand. She appeared ok after her fall. Last night her family found her S/P fall again but unclear how she fell.  Patietn has no real recollection of the falls. This AM she was noted to have difficulty following commands, expressing herself and seemed confused. HE speech was slurred and she was having difficulty getting her thoughts out.  Currently she is asymptomatic other than having no recollection of event. No seizure activity was noted. It was mentioned her parents had PD and patient has been noted to have resting tremor, stooped posture, shuffled gait and difficulty getting up from seated position.   Date last known well: Date: 06/17/2015 Time last known well: Unable to determine tPA Given: No: out window     Past Medical History  Diagnosis Date  . Hypertension   . Depression   . Recurrent falls   . Anemia   . IBS (irritable bowel syndrome)   . Erosive esophagitis   . Diverticulosis   . Chronic back pain   . Chronic pain   . Chronic prescription opiate use   . High cholesterol   . GERD (gastroesophageal reflux disease)   . History of blood transfusion X 2    "related to when I passed out & when I was dehydrated"  . Syncope and collapse     "today is the 5th or 6th time in the last year" (01/14/2015)  . History of bleeding peptic ulcer   . History of hiatal hernia   . Osteoarthritis   . Arthritis     "knees" (01/14/2015)  . Fibromyalgia   . Carpal tunnel syndrome   . Peripheral neuropathy     "hands" (01/14/2015)  .  Chronic lower back pain     Past Surgical History  Procedure Laterality Date  . Total knee arthroplasty Left 2012  . Total knee arthroplasty Right 08/2014  . Bladder suspension    . Total knee arthroplasty Left   . Lumbar fusion  2007    Dr. Lovell Sheehan  . Laparoscopic cholecystectomy  1980's?  . Back surgery    . Joint replacement    . Abdominal hysterectomy  1970's  . Tubal ligation  1960's  . Bunionectomy Left 1970's?  . Spinal cord stimulator implant  ~ 2014/2015    Family History  Problem Relation Age of Onset  .  Hypertension Mother   . Stroke Mother   . Heart disease Father    Social History:  reports that she has never smoked. She has never used smokeless tobacco. She reports that she does not drink alcohol or use illicit drugs.  Allergies: No Known Allergies  Medications:                                                                                                                           Current Facility-Administered Medications  Medication Dose Route Frequency Audianna Landgren Last Rate Last Dose  . 0.9 %  sodium chloride infusion  100 mL/hr Intravenous Continuous Gerhard Munch, MD 100 mL/hr at 06/18/15 1205 100 mL/hr at 06/18/15 1205   Current Outpatient Prescriptions  Medication Sig Dispense Refill  . amLODipine (NORVASC) 10 MG tablet Take 10 mg by mouth daily.    Marland Kitchen aspirin EC 81 MG tablet Take 81 mg by mouth daily.    . ondansetron (ZOFRAN) 8 MG tablet Take 8 mg by mouth every 8 (eight) hours as needed for nausea or vomiting.    Marland Kitchen oxybutynin (DITROPAN-XL) 10 MG 24 hr tablet Take 10 mg by mouth at bedtime.    . Oxycodone HCl 20 MG TABS Take 1 tablet by mouth 5 (five) times daily as needed (pain).    Marland Kitchen PARoxetine (PAXIL) 30 MG tablet Take 30 mg by mouth daily.    . potassium chloride SA (K-DUR,KLOR-CON) 20 MEQ tablet Take 20 mEq by mouth daily.    . pregabalin (LYRICA) 150 MG capsule Take 150 mg by mouth 2 (two) times daily.    Marland Kitchen PRESCRIPTION MEDICATION Take 1 tablet  by mouth daily as needed (stomach).    . traZODone (DESYREL) 100 MG tablet Take 1 tablet by mouth at bedtime.  5     ROS:                                                                                                                                       History obtained from the patient and daughter-in -law  General ROS: negative for - chills, fatigue, fever, night sweats, weight gain or weight loss Psychological ROS: negative for - behavioral disorder, hallucinations, memory difficulties, mood swings or suicidal ideation Ophthalmic ROS: negative for - blurry vision, double vision, eye pain or loss of vision ENT ROS: negative for - epistaxis, nasal discharge, oral lesions, sore  throat, tinnitus or vertigo Allergy and Immunology ROS: negative for - hives or itchy/watery eyes Hematological and Lymphatic ROS: negative for - bleeding problems, bruising or swollen lymph nodes Endocrine ROS: negative for - galactorrhea, hair pattern changes, polydipsia/polyuria or temperature intolerance Respiratory ROS: negative for - cough, hemoptysis, shortness of breath or wheezing Cardiovascular ROS: negative for - chest pain, dyspnea on exertion, edema or irregular heartbeat Gastrointestinal ROS: negative for - abdominal pain, diarrhea, hematemesis, nausea/vomiting or stool incontinence Genito-Urinary ROS: negative for - dysuria, hematuria, incontinence or urinary frequency/urgency Musculoskeletal ROS: negative for - joint swelling or muscular weakness Neurological ROS: as noted in HPI Dermatological ROS: negative for rash and skin lesion changes  Neurologic Examination:                                                                                                      Blood pressure 147/101, pulse 81, temperature 99.2 F (37.3 C), temperature source Oral, resp. rate 23, height  (1.499 m), weight 61.236 kg (135 lb), SpO2 91 %.  HEENT-  Normocephalic, no lesions, without obvious abnormality.   Normal external eye and conjunctiva.  Normal TM's bilaterally.  Normal auditory canals and external ears. Normal external nose, mucus membranes and septum.  Normal pharynx. Cardiovascular- S1, S2 normal, pulses palpable throughout   Lungs- chest clear, no wheezing, rales, normal symmetric air entry Abdomen- normal findings: bowel sounds normal Extremities- no edema Lymph-no adenopathy palpable Musculoskeletal-no joint tenderness, deformity or swelling Skin-warm and dry, no hyperpigmentation, vitiligo, or suspicious lesions  Neurological Examination Mental Status: Alert, oriented, thought content appropriate.  Speech fluent without evidence of aphasia.  Able to follow 3 step commands without difficulty. Cranial Nerves: II: Discs flat bilaterally; Visual fields grossly normal, pupils equal, round, reactive to light and accommodation III,IV, VI: ptosis not present, extra-ocular motions intact bilaterally V,VII: smile symmetric, facial light touch sensation normal bilaterally VIII: hearing normal bilaterally IX,X: uvula rises symmetrically XI: bilateral shoulder shrug XII: midline tongue extension Motor: Right : Upper extremity   5/5    Left:     Upper extremity   5/5  Lower extremity   5/5     Lower extremity   5/5 Resting and postural tremor Tone and bulk:normal tone throughout; no atrophy noted Sensory: Pinprick and light touch intact throughout, bilaterally Deep Tendon Reflexes: 2+ and symmetric throughout Plantars: Right: downgoing   Left: downgoing Cerebellar: normal finger-to-nose,  and normal heel-to-shin test Gait: not tested due to safety       Lab Results: Basic Metabolic Panel:  Recent Labs Lab 06/18/15 1138  NA 136  K 5.7*  CL 103  CO2 26  GLUCOSE 93  BUN 30*  CREATININE 1.02*  CALCIUM 9.3    Liver Function Tests:  Recent Labs Lab 06/18/15 1138  AST 23  ALT 16  ALKPHOS 72  BILITOT 0.5  PROT 6.3*  ALBUMIN 3.6   No results for input(s): LIPASE,  AMYLASE in the last 168 hours. No results for input(s): AMMONIA in the last 168 hours.  CBC:  Recent Labs Lab 06/18/15 1138  WBC  9.4  HGB 9.4*  HCT 31.6*  MCV 84.7  PLT 252    Cardiac Enzymes:  Recent Labs Lab 06/18/15 1140  TROPONINI <0.03    Lipid Panel: No results for input(s): CHOL, TRIG, HDL, CHOLHDL, VLDL, LDLCALC in the last 168 hours.  CBG: No results for input(s): GLUCAP in the last 168 hours.  Microbiology: Results for orders placed or performed during the hospital encounter of 01/14/15  Urine culture     Status: None   Collection Time: 01/14/15  9:29 AM  Result Value Ref Range Status   Specimen Description URINE, RANDOM  Final   Special Requests NONE  Final   Colony Count   Final    >=100,000 COLONIES/ML Performed at [facility]    Culture   Final    Multiple bacterial morphotypes present, none predominant. Suggest appropriate recollection if clinically indicated. Performed at [facility]    Report Status 01/15/2015 FINAL  Final    Coagulation Studies:  Recent Labs  06/18/15 1140  LABPROT 13.0  INR 0.97    Imaging: Ct Head Wo Contrast  06/18/2015   CLINICAL DATA:  Frequent falls over past week. Generalized weakness.  EXAM: CT HEAD WITHOUT CONTRAST  TECHNIQUE: Contiguous axial images were obtained from the base of the skull through the vertex without intravenous contrast.  COMPARISON:  01/14/2015  FINDINGS: Dilated perivascular space in the left lower basal ganglia, stable. No acute intracranial abnormality. Specifically, no hemorrhage, hydrocephalus, mass lesion, acute infarction, or significant intracranial injury. No acute calvarial abnormality. Visualized paranasal sinuses and mastoids clear. Orbital soft tissues unremarkable.  IMPRESSION: No acute intracranial abnormality.   Electronically Signed   By: Charlett Nose M.D.   On: 06/18/2015 13:03       Assessment and plan discussed with with attending physician and they are  in agreement.    Felicie Morn PA-C [facility] [phone]  06/18/2015, 3:35 PM   Stroke Risk Factors - hyperlipidemia and hypertension   Assessment: 69 y.o. female with transient difficulty with understanding and speech. The description of word substitution and patient not feeling like the things she was saying were wrong seems most consistent with a temporal aphasia and I am concerned for TIA and favor this as the diagnosis. The lapses in memory could just be her dementia, but will also get EEG, though the duration seems long for seizure. Medication effect would be a final option, but she is adamant she did not take more than one dose of her narcotic.   1. HgbA1c, fasting lipid panel 2. MRI, MRA  of the brain without contrast 3. Frequent neuro checks 4. Echocardiogram 5. Carotid dopplers 6. Prophylactic therapy-Antiplatelet med: Aspirin - dose  PO or  PR 7. Risk factor modification 8. Telemetry monitoring 9. EEG  Ritta Slot, MD [facility] [phone]  If 7pm- 7am, please page neurology on call as listed in [facility].

## 2015-06-18 NOTE — ED Notes (Signed)
Pt could not stand during orthostatics alone. Pt held onto this NT's arms and leaned against the bed. This NT encouraged pt to stand as tall as she could. Pt also could not sit on the side of the bed, pt would slowly lean backwards while sitting up. Orthostatics completed.

## 2015-06-18 NOTE — ED Notes (Signed)
Dr. Kirkpatrick at bedside 

## 2015-06-18 NOTE — ED Notes (Signed)
Spoke with MRI, pt has spinal cord stimulator, patient does not have card info. MRI cannot be done until we know the type of stimulator it is. Patients family member driving home to get card, will call MRI with information when patient family member returns.

## 2015-06-18 NOTE — ED Notes (Signed)
MD at bedside. 

## 2015-06-19 ENCOUNTER — Observation Stay (HOSPITAL_COMMUNITY)
Admit: 2015-06-19 | Discharge: 2015-06-19 | Disposition: A | Discharge status: Home / Self Care | Payer: PPO | Attending: Neurology | Admitting: Neurology

## 2015-06-19 ENCOUNTER — Observation Stay (HOSPITAL_BASED_OUTPATIENT_CLINIC_OR_DEPARTMENT_OTHER): Payer: PPO

## 2015-06-19 DIAGNOSIS — R2681 Unsteadiness on feet: Secondary | ICD-10-CM | POA: Diagnosis not present

## 2015-06-19 DIAGNOSIS — D509 Iron deficiency anemia, unspecified: Secondary | ICD-10-CM | POA: Diagnosis not present

## 2015-06-19 DIAGNOSIS — R2689 Other abnormalities of gait and mobility: Secondary | ICD-10-CM | POA: Diagnosis not present

## 2015-06-19 DIAGNOSIS — M545 Low back pain, unspecified: Secondary | ICD-10-CM | POA: Insufficient documentation

## 2015-06-19 DIAGNOSIS — I6789 Other cerebrovascular disease: Secondary | ICD-10-CM

## 2015-06-19 DIAGNOSIS — I10 Essential (primary) hypertension: Secondary | ICD-10-CM

## 2015-06-19 DIAGNOSIS — E785 Hyperlipidemia, unspecified: Secondary | ICD-10-CM

## 2015-06-19 DIAGNOSIS — Z79891 Long term (current) use of opiate analgesic: Secondary | ICD-10-CM

## 2015-06-19 DIAGNOSIS — R4701 Aphasia: Secondary | ICD-10-CM | POA: Diagnosis not present

## 2015-06-19 DIAGNOSIS — G459 Transient cerebral ischemic attack, unspecified: Secondary | ICD-10-CM | POA: Diagnosis not present

## 2015-06-19 LAB — HEMOGLOBIN A1C
Hgb A1c MFr Bld: 6.1 % — ABNORMAL HIGH (ref 4.8–5.6)
MEAN PLASMA GLUCOSE: 128 mg/dL

## 2015-06-19 LAB — CBC
HEMATOCRIT: 30.4 % — AB (ref 36.0–46.0)
HEMOGLOBIN: 9.2 g/dL — AB (ref 12.0–15.0)
MCH: 25.1 pg — ABNORMAL LOW (ref 26.0–34.0)
MCHC: 30.3 g/dL (ref 30.0–36.0)
MCV: 82.8 fL (ref 78.0–100.0)
Platelets: 233 10*3/uL (ref 150–400)
RBC: 3.67 MIL/uL — AB (ref 3.87–5.11)
RDW: 16.1 % — ABNORMAL HIGH (ref 11.5–15.5)
WBC: 7.1 10*3/uL (ref 4.0–10.5)

## 2015-06-19 LAB — HEPATITIS C ANTIBODY (REFLEX): HCV Ab: <0.1 s/co ratio (ref 0.0–0.9)

## 2015-06-19 LAB — HIV ANTIBODY (ROUTINE TESTING W REFLEX): HIV SCREEN 4TH GENERATION: NONREACTIVE

## 2015-06-19 LAB — BASIC METABOLIC PANEL
ANION GAP: 8 (ref 5–15)
BUN: 18 mg/dL (ref 6–20)
CALCIUM: 8.7 mg/dL — AB (ref 8.9–10.3)
CHLORIDE: 103 mmol/L (ref 101–111)
CO2: 27 mmol/L (ref 22–32)
Creatinine, Ser: 0.84 mg/dL (ref 0.44–1.00)
GFR calc non Af Amer: >60 mL/min (ref >60)
GLUCOSE: 89 mg/dL (ref 65–99)
POTASSIUM: 3.9 mmol/L (ref 3.5–5.1)
Sodium: 138 mmol/L (ref 135–145)

## 2015-06-19 LAB — HCV COMMENT:

## 2015-06-19 MED ORDER — ATORVASTATIN CALCIUM 10 MG PO TABS: 10.0000 mg | ORAL_TABLET | Freq: Every day | ORAL | Status: DC

## 2015-06-19 MED ORDER — OXYCODONE HCL 20 MG PO TABS: 1.0000 | ORAL_TABLET | Freq: Four times a day (QID) | ORAL | Status: AC | PRN

## 2015-06-19 MED ORDER — OXYCODONE HCL 5 MG PO TABS
20.0000 mg | ORAL_TABLET | Freq: Four times a day (QID) | ORAL | Status: DC | PRN
  Administered 2015-06-19: 20 mg via ORAL
  Filled 2015-06-19: qty 4

## 2015-06-19 MED ORDER — FERROUS SULFATE 325 (65 FE) MG PO TABS
325.0000 mg | ORAL_TABLET | Freq: Every day | ORAL | Status: DC
Start: 2015-06-19 — End: 2015-06-19
  Administered 2015-06-19: 325 mg via ORAL
  Filled 2015-06-19: qty 1

## 2015-06-19 MED ORDER — FERROUS SULFATE 325 (65 FE) MG PO TABS: 325.0000 mg | ORAL_TABLET | Freq: Every day | ORAL | Status: AC

## 2015-06-19 NOTE — Care Management Note (Signed)
Case Management Note  Patient Details  Name: QUANITA BARONA MRN: [id number] Date of Birth: January 02, 1946  Subjective/Objective:                    Action/Plan: Patient admitted with aphasia and weakness. Pt is from home alone. Pt is being discharged home with home health PT and youth rolling walker. CM met with the patient at the bedside and provided her a list of [facility] agencies in [place]. Patient chose [facility]. Miranda with [facility] notified and referral accepted. Jermaine with [facility] notified of youth rolling walker, but he has none in stock in the hospital. CM provided the patient with the order and instructed her on going to [facility] retail store to obtain the walker. Pt expressed understanding. Bedside RN aware.   Expected Discharge Date:                  Expected Discharge Plan:  [place]  In-House Referral:     Discharge planning Services  CM Consult  Post Acute Care Choice:    Choice offered to:  Patient  DME Arranged:  [facility] D[facility] Agency:  [place]:  PT [facility] Agency:  [place]  Status of Service:  Completed, signed off  Medicare Important Message Given:    Date Medicare IM Given:    Medicare IM give by:    Date Additional Medicare IM Given:    Additional Medicare Important Message give by:     If discussed at [place] of Stay Meetings, dates discussed:    Additional Comments:  Pollie Friar, RN 06/19/2015, 4:36 PM

## 2015-06-19 NOTE — Discharge Instructions (Signed)
-  Stop taking trazodone and Ditropan  -Decrease your oxycodone to 1 tablet every 6 hours as needed  -Start taking lipitor 10 mg daily for cholesterol  -Start taking ferrous sulfate 1 tablet daily for low iron levels, it may make your stools dark and constipated  -Please follow-up with your PCP as scheduled

## 2015-06-19 NOTE — Progress Notes (Signed)
*  PRELIMINARY RESULTS* Vascular Ultrasound Carotid Duplex (Doppler) has been completed.   Findings suggest 1-39% internal carotid artery stenosis bilaterally. Vertebral arteries are patent with antegrade flow.  06/19/2015 10:53 AM Gertie Fey, RVT, RDCS, RDMS

## 2015-06-19 NOTE — Progress Notes (Signed)
Subjective: Patient was seen and examined at bedside this morning. States she is doing well and does not have any complaints at present. Denies any headaches, lightheadedness, chest pain, or shortness of breath. Denies having any focal numbness, weakness, or tingling of extremities.  Objective: Vital signs in last 24 hours: Filed Vitals:   06/19/15 0000 06/19/15 0400 06/19/15 0601 06/19/15 1356  BP: 111/52 125/58 131/72 102/61  Pulse: 71 69 73 70  Temp: 98.4 F (36.9 C) 98 F (36.7 C) 98 F (36.7 C) 98 F (36.7 C)  TempSrc: Oral Oral Oral Oral  Resp: Height:      Weight:      SpO2: 93% 94% 93% 94%   Weight change:  No intake or output data in the 24 hours ending 06/19/15 1627  Physical Exam  Constitutional: She appears well-developed and well-nourished. No distress.   Cardiovascular: Normal rate, regular rhythm and intact distal pulses. Exam reveals no gallop and no friction rub.  No murmur heard. Pulmonary/Chest: Effort normal and breath sounds normal. No respiratory distress. She has no wheezes. She has no rales.  Abdominal: Soft. Bowel sounds are normal. She exhibits no distension. There is no tenderness.  Musculoskeletal: She exhibits no edema.  Neurological: She is alert and oriented to person, place, and time.  CN II-XII grossly intact Sensation grossly intact in bilateral upper and lower extremities Strength 5/5 in bilateral upper and lower extremities.  Skin: Skin is warm and dry.   Lab Results: Basic Metabolic Panel:  Recent Labs Lab 06/18/15 1951 06/19/15 0401  NA 136 138  K 4.3 3.9  CL 101 103  CO2 27 27  GLUCOSE 97 89  BUN 22* 18  CREATININE 0.93 0.84  CALCIUM 9.0 8.7*  MG 1.9  --    Assessment/Plan: Principal Problem:   Aphasia Active Problems:   Gait instability   Midline low back pain without sciatica   HLD (hyperlipidemia)   Essential hypertension  69 year old female with a past medical history of recurrent falls,  syncope and collapse, GERD, erosive esophagitis, peripheral neuropathy and chronic pain presenting to [facility] after 2 recent falls likely secondary to polypharmacy.  Recurrent falls secondary to polypharmacy  Patient experienced transient slurring of speech and right-sided motor weakness. Symptoms now resolved- speech normal and strength 5 out of 5 in bilateral upper and lower extremities. CT of head did not show any acute intracranial abnormality. CTA of head with no large vessel occlusion or high grade stenosis. CTA neck without hemodynamically significant stenosis or acute vascular process. Echo with EF 50-55%. Carotid dopplers showing 1-39% stenosis of internal carotid arteries bilaterally. Normal EEG. Patient is currently taking aspirin 81 mg at home. Lipid panel showing cholesterol 174 and LDL 102. B12 normal. Patient's recurrent falls are likely secondary to polypharmacy. She is currently taking several centrally acting agents at home. Patient is stable today and does not have any focal neurological deficits. Likely discharge today.  -Aspirin 81 mg daily -Atorvastatin 10 mg daily -Patient is on Oxycodone 20 mg at home for chronic pain. Frequency decreased to 4 times daily as needed instead of 5 times daily.  -Discontinued Trazodone and Ditropan  -Follow up with PCP on 06/26/15. CTA of head and neck revealed a  thickened irregular esophagus concerning for esophagitis vs mass. Patient is not complaining of any melena, dysphagia, odynphagia, low grade fevers, or unintentional weight loss. Will make suggestion to PCP to give referral for outpatient EGD.   IDA  Hemoglobin 9.2, MCV normal. Low ferritin, iron, and saturation ratio. TIBC on the upper limit of normal. Folate normal.  -Ferrous sulfate 325 mg daily   Hyperkalemia (resolved) Potassium 3.9 today. Patient reported taking potassium supplement at home. She was advised to stop taking the supplement.   Depression -Discontinued  Trazodone 100 mg daily in the setting of frequent falls  -Continue paroxetine 30 mg daily  Peripheral neuropathy -Continue Pregabalin 150 mg twice daily  Chronic pain -Oxycodone 20 mg: reduced to 4 times daily as needed  Urinary incontinence -Discontinued oxybutynin 10 mg daily in the setting of frequent falls   DVT prophylaxis: Lovenox   Diet: Heart healthy  Code: FULL  Dispo: Disposition is deferred at this time, awaiting improvement of current medical problems.  Anticipated discharge in approximately 0 day(s).   The patient does have a current PCP Lonie Peak, PA-C) and does need an [facility] hospital follow-up appointment after discharge.  The patient does not have transportation limitations that hinder transportation to clinic appointments.  .Services Needed at time of discharge: Y = Yes, Blank = No PT:   OT:   RN:   Equipment:   Other:     LOS: 1 day   John Giovanni, MD 06/19/2015, 4:27 PM

## 2015-06-19 NOTE — Progress Notes (Addendum)
OT Cancellation Note  Patient Details Name: Gina Lowe MRN: [id number] DOB: 30-Nov-1945   Cancelled Treatment:    Reason Eval/Treat Not Completed: Patient at procedure or test/ unavailable;Other (comment) (Echo)  Pilar Grammes 06/19/2015, 9:25 AM

## 2015-06-19 NOTE — Progress Notes (Signed)
EEG Completed; Results Pending  

## 2015-06-19 NOTE — Progress Notes (Signed)
Pt wheeled out to vehicle without incident by nurse tech with family at side. Gara Kroner, RN

## 2015-06-19 NOTE — Procedures (Signed)
ELECTROENCEPHALOGRAM REPORT  Patient: Gina Lowe       Room #: 1O10 EEG No. ID: [id number] Age: 69 y.o.        Sex: female Referring Physician: Oswaldo Done, D Report Date:  06/19/2015        Interpreting Physician: Aline Brochure  History: Gina Lowe is an 69 y.o. female history of hypertension, hyperlipidemia and depression admitted following an episode of transient receptive aphasia.  Indications for study:  Rule out focal seizure disorder.  Technique: This is an 18 channel routine scalp EEG performed at the bedside with bipolar and monopolar montages arranged in accordance to the international 10/20 system of electrode placement.   Description: This EEG recording was performed during wakefulness and during light sleep. Predominant activity during wakefulness consisted of 9  Hz alpha rhythm with good attenuation with eye opening. Photic stimulation and hyperventilation were not performed. There was symmetrical slowing of background activity with mixed irregular delta and theta activity diffusely during sleep. Symmetrical vertex waves and sleep spindles were also recorded. No epileptiform discharges were recorded. There was no abnormal slowing of cerebral activity.  Interpretation: This is a normal EEG recording during wakefulness during wakefulness and light sleep. No evidence of an epileptic disorder was demonstrated. However, a normal EEG recording in and of itself does not rule out seizure disorder.   Venetia Maxon M.D. [facility] [phone]

## 2015-06-19 NOTE — Discharge Summary (Signed)
Name: Gina Lowe MRN: [id number] DOB: 1946-08-31 69 y.o. PCP: Lonie Peak, PA-C  Date of Admission: 06/18/2015 11:01 AM Date of Discharge: 06/20/2015 Attending Physician: No att. providers found  Discharge Diagnosis:  Principal Problem:   Aphasia Active Problems:   Gait instability   Midline low back pain without sciatica   HLD (hyperlipidemia)   Essential hypertension  Discharge Medications:   Medication List    STOP taking these medications        oxybutynin 10 MG 24 hr tablet  Commonly known as:  DITROPAN-XL     traZODone 100 MG tablet  Commonly known as:  DESYREL      TAKE these medications        amLODipine 10 MG tablet  Commonly known as:  NORVASC  Take 10 mg by mouth daily.     aspirin EC 81 MG tablet  Take 81 mg by mouth daily.     atorvastatin 10 MG tablet  Commonly known as:  LIPITOR  Take 1 tablet (10 mg total) by mouth daily at 6 PM.     ferrous sulfate 325 (65 FE) MG tablet  Take 1 tablet (325 mg total) by mouth daily with breakfast.     ondansetron 8 MG tablet  Commonly known as:  ZOFRAN  Take 8 mg by mouth every 8 (eight) hours as needed for nausea or vomiting.     Oxycodone HCl 20 MG Tabs  Take 1 tablet (20 mg total) by mouth every 6 (six) hours as needed (pain).     PARoxetine 30 MG tablet  Commonly known as:  PAXIL  Take 30 mg by mouth daily.     pregabalin 150 MG capsule  Commonly known as:  LYRICA  Take 150 mg by mouth 2 (two) times daily.     PRESCRIPTION MEDICATION  Take 1 tablet by mouth daily as needed (stomach).        Disposition and follow-up:   Gina Lowe was discharged from [facility] in Stable condition.  At the hospital follow up visit please address:  1.  CTA of head and neck revealed a thickened irregular esophagus concerning for esophagitis vs mass. Patient was not complaining of any melena, dysphagia, odynphagia, low grade fevers, or unintentional weight loss. We request  PCP to give referral for outpatient EGD for further workup.   Polypharmacy: We discontinued Trazodone and Ditropan in the setting of frequent falls. In addition, we have reduced the frequency of home medication Oxycodone 20 mg from 5 times daily to 4 times daily as needed. Please consider titrating dose down further.   Iron deficiency anemia: Patient started on ferrous sulfate 325 mg qd. Please repeat CBC as outpatient.   2.  Labs / imaging needed at time of follow-up: EGD, CBC  3.  Pending labs/ test needing follow-up:   Follow-up Appointments: Follow-up Information    Follow up with CONROY,NATHAN, PA-C. Go on 06/26/2015.   Specialty:  Physician Assistant   Why:  Follow up appointment at 11 am.    Contact information:   [facility] [address] [place] [address] [phone]       Discharge Instructions:   -Stop taking trazodone and Ditropan  -Decrease your oxycodone to 1 tablet every 6 hours as needed  -Start taking lipitor 10 mg daily for cholesterol  -Start taking ferrous sulfate 1 tablet daily for low iron levels, it may make your stools dark and constipated  -Please follow-up with your PCP  as scheduled    Consultations:   Neurology  Procedures Performed:  Ct Angio Head W/cm &/or Wo Cm  06/19/2015   ADDENDUM REPORT: 06/19/2015 02:40 ADDENDUM: Correction: Under CTA head findings, codominant vertebral arteries. Electronically Signed   By: Awilda Metro M.D.   On: 06/19/2015 02:40  06/19/2015   CLINICAL DATA:  Syncope, recent falls. Found down on bathroom floor by son last night. Slurred speech, aphasia, ataxia, bilateral numbness. History of spinal cord stimulator, hypertension, hypercholesterolemia.  EXAM: CT ANGIOGRAPHY HEAD AND NECK  TECHNIQUE: Multidetector CT imaging of the head and neck was performed using the standard protocol during bolus administration of intravenous contrast. Multiplanar CT image reconstructions and MIPs were obtained to evaluate  the vascular anatomy. Carotid stenosis measurements (when applicable) are obtained utilizing NASCET criteria, using the distal internal carotid diameter as the denominator.  CONTRAST:  50mL OMNIPAQUE IOHEXOL 350 MG/ML SOLN  COMPARISON:  None.  FINDINGS: CT HEAD  The ventricles and sulci are normal for age. No intraparenchymal hemorrhage, mass effect nor midline shift. Patchy supratentorial white matter hypodensities are less than expected for patient's age and though non-specific suggest sequelae of chronic small vessel ischemic disease. LEFT inferior basal ganglia perivascular space again noted. No acute large vascular territory infarcts. No abnormal intracranial enhancement.  No abnormal extra-axial fluid collections ; presence of contrast limits sensitivity for subarachnoid blood though none is suspected. Basal cisterns are patent. Moderate calcific atherosclerosis of the carotid siphons.  No skull fracture. Subcentimeter probable hemangioma RIGHT parietal diploic space. The included ocular globes and orbital contents are non-suspicious. The mastoid aircells and included paranasal sinuses are well-aerated.  CTA NECK  Normal appearance of the thoracic arch, normal branch pattern. Mild calcific atherosclerosis of the aortic arch. Tortuous innominate artery. The origins of the innominate, left Common carotid artery and subclavian artery are widely patent.  Bilateral Common carotid arteries are widely patent, coursing in a straight line fashion. Normal appearance of the carotid bifurcations without hemodynamically significant stenosis by NASCET criteria. Minimal eccentric calcific atherosclerosis of the carotid bulbs. Normal appearance of the included internal carotid arteries.  Codominant vertebral arteries. Tortuous LEFT vertebral artery origin. Normal appearance of the vertebral arteries, which appear widely patent.  No dissection, no pseudoaneurysm. No abnormal luminal irregularity. No contrast extravasation.   Thickened irregular included esophagus. Severe C5-6 and C6-7 disc height loss, uncovertebral hypertrophy consistent with degenerative discs, resulting in severe LEFT C6-7 neural foraminal narrowing. No acute osseous process though bone windows have not been submitted.  CTA HEAD  Anterior circulation: Normal appearance of the cervical internal carotid arteries, petrous, cavernous and supra clinoid internal carotid arteries. Widely patent anterior communicating artery. Normal appearance of the anterior and middle cerebral arteries.  Posterior circulation: vertebral artery is dominant with normal appearance of the vertebral arteries, vertebrobasilar junction and basilar artery, as well as main branch vessels. Normal appearance of the posterior cerebral arteries.  No large vessel occlusion, hemodynamically significant stenosis, dissection, luminal irregularity, contrast extravasation or aneurysm within the anterior nor posterior circulation.  IMPRESSION: CT HEAD: No acute intracranial process ; normal noncontrast CT head for age.  CTA NECK: Mild calcific atherosclerosis without hemodynamically significant stenosis or acute vascular process.  Partially imaged thickened irregular esophagus, which can be seen with esophagitis or mass, recommend direct inspection versus upper GI.  CTA HEAD: No large vessel occlusion or high-grade stenosis. Moderate calcific atherosclerosis the carotid siphons.  Electronically Signed: By: Awilda Metro M.D. On: 06/18/2015 23:11   Dg Chest 2 View  06/18/2015   CLINICAL DATA:  Chest pain for 1 day  EXAM: CHEST  2 VIEW  COMPARISON:  January 14, 2015  FINDINGS: The heart size and mediastinal contours are within normal limits. Both lungs are clear. Spinal stimulator leads are identified. There is a hiatal hernia. Surgical clips are identified in right upper quadrant consistent with prior cholecystectomy.  IMPRESSION: No active cardiopulmonary disease.   Electronically Signed   By: Sherian Rein M.D.   On: 06/18/2015 18:58   Ct Head Wo Contrast  06/18/2015   CLINICAL DATA:  Frequent falls over past week. Generalized weakness.  EXAM: CT HEAD WITHOUT CONTRAST  TECHNIQUE: Contiguous axial images were obtained from the base of the skull through the vertex without intravenous contrast.  COMPARISON:  01/14/2015  FINDINGS: Dilated perivascular space in the left lower basal ganglia, stable. No acute intracranial abnormality. Specifically, no hemorrhage, hydrocephalus, mass lesion, acute infarction, or significant intracranial injury. No acute calvarial abnormality. Visualized paranasal sinuses and mastoids clear. Orbital soft tissues unremarkable.  IMPRESSION: No acute intracranial abnormality.   Electronically Signed   By: Charlett Nose M.D.   On: 06/18/2015 13:03   Ct Angio Neck W/cm &/or Wo/cm  06/19/2015   ADDENDUM REPORT: 06/19/2015 02:40 ADDENDUM: Correction: Under CTA head findings, codominant vertebral arteries. Electronically Signed   By: Awilda Metro M.D.   On: 06/19/2015 02:40  06/19/2015   CLINICAL DATA:  Syncope, recent falls. Found down on bathroom floor by son last night. Slurred speech, aphasia, ataxia, bilateral numbness. History of spinal cord stimulator, hypertension, hypercholesterolemia.  EXAM: CT ANGIOGRAPHY HEAD AND NECK  TECHNIQUE: Multidetector CT imaging of the head and neck was performed using the standard protocol during bolus administration of intravenous contrast. Multiplanar CT image reconstructions and MIPs were obtained to evaluate the vascular anatomy. Carotid stenosis measurements (when applicable) are obtained utilizing NASCET criteria, using the distal internal carotid diameter as the denominator.  CONTRAST:  50mL OMNIPAQUE IOHEXOL 350 MG/ML SOLN  COMPARISON:  None.  FINDINGS: CT HEAD  The ventricles and sulci are normal for age. No intraparenchymal hemorrhage, mass effect nor midline shift. Patchy supratentorial white matter hypodensities are less than expected  for patient's age and though non-specific suggest sequelae of chronic small vessel ischemic disease. LEFT inferior basal ganglia perivascular space again noted. No acute large vascular territory infarcts. No abnormal intracranial enhancement.  No abnormal extra-axial fluid collections ; presence of contrast limits sensitivity for subarachnoid blood though none is suspected. Basal cisterns are patent. Moderate calcific atherosclerosis of the carotid siphons.  No skull fracture. Subcentimeter probable hemangioma RIGHT parietal diploic space. The included ocular globes and orbital contents are non-suspicious. The mastoid aircells and included paranasal sinuses are well-aerated.  CTA NECK  Normal appearance of the thoracic arch, normal branch pattern. Mild calcific atherosclerosis of the aortic arch. Tortuous innominate artery. The origins of the innominate, left Common carotid artery and subclavian artery are widely patent.  Bilateral Common carotid arteries are widely patent, coursing in a straight line fashion. Normal appearance of the carotid bifurcations without hemodynamically significant stenosis by NASCET criteria. Minimal eccentric calcific atherosclerosis of the carotid bulbs. Normal appearance of the included internal carotid arteries.  Codominant vertebral arteries. Tortuous LEFT vertebral artery origin. Normal appearance of the vertebral arteries, which appear widely patent.  No dissection, no pseudoaneurysm. No abnormal luminal irregularity. No contrast extravasation.  Thickened irregular included esophagus. Severe C5-6 and C6-7 disc height loss, uncovertebral hypertrophy consistent with degenerative discs, resulting in severe LEFT C6-7 neural  foraminal narrowing. No acute osseous process though bone windows have not been submitted.  CTA HEAD  Anterior circulation: Normal appearance of the cervical internal carotid arteries, petrous, cavernous and supra clinoid internal carotid arteries. Widely patent  anterior communicating artery. Normal appearance of the anterior and middle cerebral arteries.  Posterior circulation: vertebral artery is dominant with normal appearance of the vertebral arteries, vertebrobasilar junction and basilar artery, as well as main branch vessels. Normal appearance of the posterior cerebral arteries.  No large vessel occlusion, hemodynamically significant stenosis, dissection, luminal irregularity, contrast extravasation or aneurysm within the anterior nor posterior circulation.  IMPRESSION: CT HEAD: No acute intracranial process ; normal noncontrast CT head for age.  CTA NECK: Mild calcific atherosclerosis without hemodynamically significant stenosis or acute vascular process.  Partially imaged thickened irregular esophagus, which can be seen with esophagitis or mass, recommend direct inspection versus upper GI.  CTA HEAD: No large vessel occlusion or high-grade stenosis. Moderate calcific atherosclerosis the carotid siphons.  Electronically Signed: By: Awilda Metro M.D. On: 06/18/2015 23:11    2D Echo (06/19/15):  Study Conclusions  - Left ventricle: The cavity size was normal. Wall thickness was increased in a pattern of mild LVH. Systolic function was normal. The estimated ejection fraction was in the range of 50% to 55%. Wall motion was normal; there were no regional wall motion abnormalities. Left ventricular diastolic function parameters were normal. - Mitral valve: Calcified annulus. There was mild regurgitation. - Pulmonary arteries: PA peak pressure: 32 mm Hg (S).  Admission HPI: Patient is a 69 year old female with a past medical history of recurrent falls, syncope and collapse, GERD, erosive esophagitis, peripheral neuropathy and chronic pain presenting to [facility] after 2 recent falls. Patient was accompanied by her daughter-in-law who gave the history. Stated patient was found on Monday lying on her bathroom floor and had hit her head  against a metal stand in the bathroom. It was not clear how long the patient was lying on the bathroom floor. At the time patient did not seek medical attention. Stated patient was found on the bathroom floor last night again by her son who thought her speech was slurred at the time. States the patient lives by herself and a neighbor helps her with her daily chores. Denies noticing any facial droop. As per patient, her right side feels weak since yesterday. Patient also reports having numbness and tingling in her hands but states that is chronic because she has peripheral neuropathy. Patient does not remember falling or losing consciousness. Patient denies any history of seizures or stroke in the past. She is currently taking aspirin 81 mg daily. In addition, she takes amlodipine for blood pressure. Patient states she has chronic balance problems and has seen physical therapy before. States she feels lightheaded when getting up fast from her bed.   Hospital Course by problem list: Principal Problem:   Aphasia Active Problems:   Gait instability   Midline low back pain without sciatica   HLD (hyperlipidemia)   Essential hypertension   Recurrent falls secondary to polypharmacy  Patient presented for evaluation of recent fall, transient slurring of speech, and right-sided motor weakness. Examination revealed normal speech and intact motor strength (5 out of 5 in bilateral upper and lower extremities). CT of head did not show any acute intracranial abnormality. CTA of head with no large vessel occlusion or high grade stenosis. CTA neck without hemodynamically significant stenosis or acute vascular process. Echo with EF 50-55%. Carotid dopplers showing 1-39%  stenosis of internal carotid arteries bilaterally. Normal EEG. Patient is currently taking Aspirin 81 mg at home. Lipid panel showing cholesterol 174 and HDL 54. Continued Aspirin 81 mg qd and Atorvastatin 10 mg qd. B12 level normal. Patient's recurrent  falls were likely secondary to polypharmacy. She was taking several centrally acting agents at home. We discontinued Trazodone and Ditropan. Patient was taking Oxycodone 20 mg 5 times daily at home for chronic pain. Frequency decreased to 4 times daily as needed. On the day of discharge, patient was stable and did not have any focal neurological deficits. We have scheduled her for a follow-up appointment with PCP on 06/26/15. CTA of head and neck revealed athickened irregular esophagus concerning for esophagitis vs mass. Patient was not complaining of any melena, dysphagia, odynphagia, low grade fevers, or unintentional weight loss. We request PCP to give referral for outpatient EGD for further workup.   IDA Hemoglobin 9.2, MCV normal. Low ferritin, iron, and saturation ratio. TIBC on the upper limit of normal. Patient was started on Ferrous sulfate 325 mg daily.   Hyperkalemia (resolved) Potassium 5.7 on admission. Patient reported taking a potassium supplement at home. She was advised to stop taking the supplement. Potassium 3.9 on day of discharge.   Depression Discontinued home medication Trazodone in the setting of frequent falls. Continued Paroxetine.    Peripheral neuropathy Continued Pregabalin.   Chronic pain Oxycodone 20 mg: reduced to 4 times daily as needed.  Urinary incontinence Discontinued Oxybutynin in the setting of frequent falls.   Discharge Vitals:   BP 102/61 mmHg  Pulse 70  Temp(Src) 98 F (36.7 C) (Oral)  Resp 18  Ht  (1.499 m)  Wt 61.236 kg (135 lb)  BMI 27.25 kg/m2  SpO2 94%  Discharge Labs:  No results found for this or any previous visit (from the past 24 hour(s)).  Signed: John Giovanni, MD 06/20/2015, 1:44 PM    Services Ordered on Discharge: HHPT Equipment Ordered on Discharge: rolling walker

## 2015-06-19 NOTE — Progress Notes (Signed)
Occupational Therapy Evaluation Patient Details Name: Gina Lowe MRN: [id number] DOB: 1946/09/13 Today's Date: 06/19/2015    History of Present Illness Pt is a 69 year old female with a past medical history of recurrent falls, syncope and collapse, GERD, erosive esophagitis, peripheral neuropathy and chronic pain presenting to [facility] after 2 recent falls. Son reports slurred speech.   Clinical Impression   Pt admitted with the above diagnoses and presents with below problem list. Pt will benefit from continued acute OT to address the below listed deficits and maximize independence with BADLs prior to d/c home. PTA pt was mod I with ADLs. Pt is currently min guard for LB ADLs and functional mobility. OT to continue to follow acutely.      Follow Up Recommendations  No OT follow up;Supervision - Intermittent;Other (comment) (OOB/mobility)    Equipment Recommendations  None recommended by OT    Recommendations for Other Services PT consult     Precautions / Restrictions Precautions Precautions: Fall Restrictions Weight Bearing Restrictions: No      Mobility Bed Mobility Overal bed mobility: Needs Assistance Bed Mobility: Supine to Sit;Sit to Supine     Supine to sit: Supervision Sit to supine: Supervision   General bed mobility comments: bed rails, extra time  Transfers Overall transfer level: Needs assistance Equipment used: Rolling walker (2 wheeled) Transfers: Sit to/from Stand Sit to Stand: Min guard         General transfer comment: from EOB, 3n1    Balance                                            ADL Overall ADL's : Needs assistance/impaired Eating/Feeding: Set up;Sitting   Grooming: Supervision/safety;Standing Grooming Details (indicate cue type and reason): sink for external support Upper Body Bathing: Supervision/ safety;Sitting   Lower Body Bathing: Min guard;Sit to/from stand   Upper Body Dressing :  Supervision/safety;Sitting   Lower Body Dressing: Min guard;Sit to/from stand   Toilet Transfer: Supervision/safety;Ambulation;BSC;RW   Toileting- Architect and Hygiene: Min guard;Sit to/from stand;Sitting/lateral lean   Tub/ Shower Transfer:  (does not do at baseline)   Functional mobility during ADLs: Rolling walker;Min guard General ADL Comments: Pt compelted in-room ambulation, toilet transfer, pericare, and grooming as detailed above. Pt reports balance is at/near baseline. Educated on fall prevention.      Vision Vision Assessment?: No apparent visual deficits   Perception     Praxis      Pertinent Vitals/Pain Pain Assessment: Faces Faces Pain Scale: Hurts a little bit Pain Location: back Pain Descriptors / Indicators: Aching;Constant Pain Intervention(s): Monitored during session     Hand Dominance Right   Extremity/Trunk Assessment Upper Extremity Assessment Upper Extremity Assessment: Overall WFL for tasks assessed;RUE deficits/detail RUE Deficits / Details: shoulder flexion to ~90 degrees, limited by pain; pt reports this is intermittent and her baseline RUE: Unable to fully assess due to pain   Lower Extremity Assessment Lower Extremity Assessment: Defer to PT evaluation       Communication Communication Communication: No difficulties   Cognition Arousal/Alertness: Awake/alert Behavior During Therapy: WFL for tasks assessed/performed Overall Cognitive Status: History of cognitive impairments - at baseline                     General Comments       Exercises       Shoulder Instructions  Home Living Family/patient expects to be discharged to:: Private residence Living Arrangements: Alone Available Help at Discharge: Family;Other (Comment);Available PRN/intermittently (3 sons live close by) Type of Home: Mobile home Home Access: Ramped entrance     Home Layout: One level     Bathroom Shower/Tub: Company secretary: Standard     Home Equipment: Cane - single point;Wheelchair - [employer] - 2 wheels;Bedside commode;Shower seat          Prior Functioning/Environment Level of Independence: Independent with assistive device(s)        Comments: Uses cane/RW at times; sponge bathes    OT Diagnosis: Other (comment) (decreased balance)   OT Problem List: Impaired balance (sitting and/or standing);Decreased knowledge of use of DME or AE;Decreased knowledge of precautions   OT Treatment/Interventions: Self-care/ADL training;DME and/or AE instruction;Therapeutic activities;Patient/family education;Balance training    OT Goals(Current goals can be found in the care plan section) Acute Rehab OT Goals Patient Stated Goal: home OT Goal Formulation: With patient Time For Goal Achievement: 06/26/15 Potential to Achieve Goals: Good ADL Goals Additional ADL Goal #1: Pt will gather items needed for grooming from around the room at mod I level.  OT Frequency:     Barriers to D/C:            Co-evaluation              End of Session Equipment Utilized During Treatment: Rolling walker  Activity Tolerance: Patient tolerated treatment well Patient left: in bed;with call bell/phone within reach;with bed alarm set   Time: 1310-1334 OT Time Calculation (min): 24 min Charges:  OT General Charges $OT Visit: 1 Procedure OT Evaluation $Initial OT Evaluation Tier I: 1 Procedure OT Treatments $Self Care/Home Management : 8-22 mins G-Codes: OT G-codes **NOT FOR INPATIENT CLASS** Functional Assessment Tool Used: clinical judgement Functional Limitation: Self care Self Care Current Status (O9629): At least 1 percent but less than 20 percent impaired, limited or restricted Self Care Goal Status (B2841): At least 1 percent but less than 20 percent impaired, limited or restricted  Pilar Grammes 06/19/2015, 2:04 PM

## 2015-06-19 NOTE — Progress Notes (Signed)
STROKE TEAM PROGRESS NOTE  HPI Gina Lowe is a 69 y.o. female who has been having difficulty with her gait over the last few months. She has been suffering from multiple falls. On Monday her son found her in the bathroom after a fall. The fall was large enough to creat a dent in a metal foot stand. She appeared ok after her fall. Last night her family found her S/P fall again but unclear how she fell. Patietn has no real recollection of the falls. This AM she was noted to have difficulty following commands, expressing herself and seemed confused. Her speech was slurred and she was having difficulty getting her thoughts out. Currently she is asymptomatic other than having no recollection of event. No seizure activity was noted. It was mentioned her parents had PD and patient has been noted to have resting tremor, stooped posture, shuffled gait and difficulty getting up from seated position.   Date last known well: Date: 06/17/2015 Time last known well: Unable to determine tPA Given: No: out window   SUBJECTIVE (INTERVAL HISTORY) No family members present. Patient states falls secondary to "passing out" or mechanical falls. Difficulty walking for 6 months. Back surgery contributed to leg weakness per patient. The patient does not feel she was confused at time of the current event. She was in bathroom with walker, however, the space was tight and she had difficulty with walker in the bathroom and she knew she was falling and she denies any confusion, speech difficulty or LOC at that time. Today she feels fine.   OBJECTIVE Temp:  [98 F (36.7 C)-99.3 F (37.4 C)] 98 F (36.7 C) (09/29 0601) Pulse Rate:  [69-88] 73 (09/29 0601) Cardiac Rhythm:  [-] Normal sinus rhythm (09/28 1900) Resp:  [14-23] 18 (09/29 0601) BP: (96-150)/(52-101) 131/72 mmHg (09/29 0601) SpO2:  [90 %-98 %] 93 % (09/29 0601) Weight:  [61.236 kg (135 lb)] 61.236 kg (135 lb) (09/28 1049)  CBC:  Recent Labs Lab  06/18/15 1138 06/19/15 0401  WBC 9.4 7.1  HGB 9.4* 9.2*  HCT 31.6* 30.4*  MCV 84.7 82.8  PLT 252 233    Basic Metabolic Panel:  Recent Labs Lab 06/18/15 1951 06/19/15 0401  NA 136 138  K 4.3 3.9  CL 101 103  CO2 27 27  GLUCOSE 97 89  BUN 22* 18  CREATININE 0.93 0.84  CALCIUM 9.0 8.7*  MG 1.9  --     Lipid Panel:    Component Value Date/Time   CHOL 174 06/18/2015 1125   TRIG 92 06/18/2015 1125   HDL 54 06/18/2015 1125   CHOLHDL 3.2 06/18/2015 1125   VLDL 18 06/18/2015 1125   LDLCALC 102* 06/18/2015 1125   HgbA1c:  Lab Results  Component Value Date   HGBA1C 6.1* 06/18/2015   Urine Drug Screen:    Component Value Date/Time   LABOPIA POSITIVE* 06/18/2015 1318   COCAINSCRNUR NONE DETECTED 06/18/2015 1318   LABBENZ POSITIVE* 06/18/2015 1318   AMPHETMU NONE DETECTED 06/18/2015 1318   THCU NONE DETECTED 06/18/2015 1318   LABBARB NONE DETECTED 06/18/2015 1318      IMAGING I have personally reviewed the radiological images below and agree with the radiology interpretations.  CT HEAD 06/19/15:  No acute intracranial process ; normal noncontrast CT head for age.    CTA NECK:  Mild calcific atherosclerosis without hemodynamically significant stenosis or acute vascular process.   Partially imaged thickened irregular esophagus, which can be seen with esophagitis or mass, recommend direct  inspection versus upper GI.    CTA HEAD:  No large vessel occlusion or high-grade stenosis. Moderate calcific atherosclerosis the carotid siphons.    Dg Chest 2 View 06/18/2015    No active cardiopulmonary disease.     Ct Head Wo Contrast 06/18/2015    No acute intracranial abnormality.    2D echo - - Left ventricle: The cavity size was normal. Wall thickness was increased in a pattern of mild LVH. Systolic function was normal. The estimated ejection fraction was in the range of 50% to 55%. Wall motion was normal; there were no regional wall motion abnormalities. Left  ventricular diastolic function parameters were normal. - Mitral valve: Calcified annulus. There was mild regurgitation. - Pulmonary arteries: PA peak pressure: 32 mm Hg (S).  EEG - This is a normal EEG recording during wakefulness during wakefulness and light sleep. No evidence of an epileptic disorder was demonstrated. However, a normal EEG recording in and of itself does not rule out seizure disorder.  PHYSICAL EXAM  Temp:  [98 F (36.7 C)-98.6 F (37 C)] 98 F (36.7 C) (09/29 1356) Pulse Rate:  [69-88] 70 (09/29 1356) Resp:  [14-23] 18 (09/29 1356) BP: (102-147)/(52-101) 102/61 mmHg (09/29 1356) SpO2:  [91 %-97 %] 94 % (09/29 1356)  General - Well nourished, well developed, in no apparent distress.  Ophthalmologic - Sharp disc margins OU.   Cardiovascular - Regular rate and rhythm with no murmur.  Mental Status -  Level of arousal and orientation to time, place, and person were intact. Language including expression, naming, repetition, comprehension was assessed and found intact. Recent and remote memory were tested, 3/3 registration and 2/3 delayed recall. Fund of Knowledge was assessed and was intact.  Cranial Nerves II - XII - II - Visual field intact OU. III, IV, VI - Extraocular movements intact. V - Facial sensation intact bilaterally. VII - Facial movement intact bilaterally. VIII - Hearing & vestibular intact bilaterally. X - Palate elevates symmetrically. XI - Chin turning & shoulder shrug intact bilaterally. XII - Tongue protrusion intact.  Motor Strength - [name] strength was 4/5 in all extremities and pronator drift was absent.  Bulk was normal and fasciculations were absent.   Motor Tone - Muscle tone was assessed at the neck and appendages and was normal.  Reflexes - [name] reflexes were 2+ in BUEs and 3+ bilateral petaller and 1+ bilateral ankle reflex, no ankle clonus and she had no pathological reflexes.  Sensory - Light touch,  temperature/pinprick, vibration and proprioception were assessed and were symmetrical.    Coordination - The patient had normal movements in the hands with no ataxia or dysmetria.  Tremor was absent.  Gait and Station - walks with walker in room and hallway, steady, but slow and cautions gait with en bloc turns, went to bathroom without problem.   ASSESSMENT/PLAN Ms. DESSIREE SZE is a 69 y.o. female with history of hyperlipidemia, chronic pain, chronic prescription opiate use, syncope history, fibromyalgia, erosive esophagitis, and recurrent falls  presenting with gait difficulties, slurred speech, difficulty following commands, resting tremor, and family history of Parkinson's disease.  She did not receive IV t-PA due to late presentation..   Gait instability - likely due to back pain and b/l knee surgery - no stroke or TIA  MRI / MRA not performed due to TENS unit at the back  CT head no acute abnormalities  CTA head and neck - unremarkable  Carotid Doppler - unremarkable  2D Echo EF  50-55%  LDL  102  EEG - normal  HgbA1c 6.1  VTE prophylaxis - Lovenox  Diet Heart Room service appropriate?: Yes; Fluid consistency:: Thin  aspirin 81 mg orally every day prior to admission, now on aspirin 325 mg orally every day. Continue ASA on discharge.  Patient counseled to be compliant with her antithrombotic medications  Ongoing aggressive stroke risk factor management  Therapy recommendations: Pending  Disposition:  Follow up with PCP after discharge  Hypertension  Stable  Home meds - norvasc  Hyperlipidemia  Home meds:  No lipid lowering medications prior to admission  LDL 102, goal < 100  Recommend lipitor  (ordered)  Other Stroke Risk Factors  Advanced age  Family hx stroke (mother)  Other Active Problems  Partially imaged thickened irregular esophagus - follow-up recommended  Back pain follow up with PCP   Hospital day # 1  Neurology will sign  off. Please call with questions. No neurology follow up needed at this time. Thanks for the consult.  Marvel Plan, MD PhD Stroke Neurology 06/19/2015 2:48 PM   To contact Stroke Continuity provider, please refer to [email or web address]. After hours, contact General Neurology

## 2015-06-19 NOTE — Progress Notes (Signed)
Echocardiogram 2D Echocardiogram has been performed.  Gina Lowe 06/19/2015, 10:42 AM

## 2015-06-19 NOTE — Evaluation (Addendum)
Physical Therapy Evaluation Patient Details Name: Gina Lowe MRN: [id number] DOB: Jul 02, 1946 Today's Date: 06/19/2015   History of Present Illness  Pt is a 69 year old female with a past medical history of recurrent falls, syncope and collapse, GERD, erosive esophagitis, peripheral neuropathy and chronic pain presenting to [facility] after 2 recent falls. Son reports slurred speech.  Clinical Impression  Pt doing well with mobility and no further PT needed.  Ready for dc from PT standpoint.      Follow Up Recommendations Home health PT    Equipment Recommendations  Rolling walker with 5" wheels (Youth size. Current walker is old and needs to be replaced)    Recommendations for Other Services       Precautions / Restrictions Precautions Precautions: Fall Restrictions Weight Bearing Restrictions: No      Mobility  Bed Mobility Overal bed mobility: Modified Independent Bed Mobility: Supine to Sit;Sit to Supine     Supine to sit: Modified independent (Device/Increase time) Sit to supine: Modified independent (Device/Increase time)   General bed mobility comments: bed rails, extra time  Transfers Overall transfer level: Modified independent Equipment used: Rolling walker (2 wheeled) Transfers: Sit to/from Stand Sit to Stand: Modified independent (Device/Increase time)         General transfer comment: from EOB, 3n1  Ambulation/Gait Ambulation/Gait assistance: Supervision Ambulation Distance (Feet): 200 Feet Assistive device: Rolling walker (2 wheeled);4-wheeled walker Gait Pattern/deviations: Step-through pattern;Decreased stride length     General Gait Details: Pt without loss of balance. Tried rollator but rollator moves too easily and pt not able to control it safely  Stairs            Wheelchair Mobility    Modified Rankin (Stroke Patients Only)       Balance Overall balance assessment: Needs assistance Sitting-balance support: No upper  extremity supported;Feet supported Sitting balance-Leahy Scale: Good     Standing balance support: No upper extremity supported Standing balance-Leahy Scale: Fair                               Pertinent Vitals/Pain Pain Assessment: Faces Faces Pain Scale: Hurts a little bit Pain Location: back Pain Descriptors / Indicators: Aching;Constant Pain Intervention(s): Monitored during session    Home Living Family/patient expects to be discharged to:: Private residence Living Arrangements: Alone Available Help at Discharge: Family;Other (Comment);Available PRN/intermittently (3 sons live close by) Type of Home: Mobile home Home Access: Ramped entrance     Home Layout: One level Home Equipment: Cane - single point;Wheelchair - [employer] - 2 wheels;Bedside commode;Shower seat Additional Comments: Pt's walker is very old and needs to be replaced.    Prior Function Level of Independence: Independent with assistive device(s)         Comments: Uses cane/RW at times; sponge bathes     Hand Dominance   Dominant Hand: Right    Extremity/Trunk Assessment   Upper Extremity Assessment: Defer to OT evaluation RUE Deficits / Details: shoulder flexion to ~90 degrees, limited by pain; pt reports this is intermittent and her baseline RUE: Unable to fully assess due to pain       Lower Extremity Assessment: Generalized weakness         Communication   Communication: No difficulties  Cognition Arousal/Alertness: Awake/alert Behavior During Therapy: WFL for tasks assessed/performed Overall Cognitive Status: History of cognitive impairments - at baseline  General Comments      Exercises        Assessment/Plan    PT Assessment All further PT needs can be met in the next venue of care  PT Diagnosis Generalized weakness;Difficulty walking   PT Problem List Decreased strength;Decreased balance;Decreased mobility  PT Treatment  Interventions     PT Goals (Current goals can be found in the Care Plan section) Acute Rehab PT Goals Patient Stated Goal: home PT Goal Formulation: All assessment and education complete, DC therapy    Frequency     Barriers to discharge        Co-evaluation               End of Session   Activity Tolerance: Patient tolerated treatment well Patient left: in bed;with call bell/phone within reach;with bed alarm set      Functional Assessment Tool Used: clinical judgement Functional Limitation: Mobility: Walking and moving around Mobility: Walking and Moving Around Current Status (U9811): At least 1 percent but less than 20 percent impaired, limited or restricted Mobility: Walking and Moving Around Goal Status [phone]): At least 1 percent but less than 20 percent impaired, limited or restricted Mobility: Walking and Moving Around Discharge Status [phone]): At least 1 percent but less than 20 percent impaired, limited or restricted    Time: 1420-1439 PT Time Calculation (min) (ACUTE ONLY): 19 min   Charges:   PT Evaluation $Initial PT Evaluation Tier I: 1 Procedure     PT G Codes:   PT G-Codes **NOT FOR INPATIENT CLASS** Functional Assessment Tool Used: clinical judgement Functional Limitation: Mobility: Walking and moving around Mobility: Walking and Moving Around Current Status (Z3086): At least 1 percent but less than 20 percent impaired, limited or restricted Mobility: Walking and Moving Around Goal Status [phone]): At least 1 percent but less than 20 percent impaired, limited or restricted Mobility: Walking and Moving Around Discharge Status [phone]): At least 1 percent but less than 20 percent impaired, limited or restricted    [facility] 06/19/2015, 4:16 PM [place]

## 2015-06-24 LAB — METHYLMALONIC ACID, SERUM: Methylmalonic Acid, Quantitative: 249 nmol/L (ref 0–378)

## 2015-08-04 ENCOUNTER — Other Ambulatory Visit: Payer: Self-pay

## 2015-08-11 ENCOUNTER — Other Ambulatory Visit: Payer: Self-pay | Admitting: Internal Medicine

## 2015-09-17 ENCOUNTER — Other Ambulatory Visit: Payer: Self-pay | Admitting: Internal Medicine

## 2015-09-20 ENCOUNTER — Other Ambulatory Visit: Payer: Self-pay | Admitting: Internal Medicine

## 2015-10-03 DIAGNOSIS — Z79899 Other long term (current) drug therapy: Secondary | ICD-10-CM | POA: Diagnosis not present

## 2015-10-03 DIAGNOSIS — I10 Essential (primary) hypertension: Secondary | ICD-10-CM | POA: Diagnosis not present

## 2015-10-03 DIAGNOSIS — F329 Major depressive disorder, single episode, unspecified: Secondary | ICD-10-CM | POA: Diagnosis not present

## 2015-10-03 DIAGNOSIS — M545 Low back pain: Secondary | ICD-10-CM | POA: Diagnosis not present

## 2015-10-14 ENCOUNTER — Other Ambulatory Visit: Payer: Self-pay | Admitting: Internal Medicine

## 2015-10-31 DIAGNOSIS — Z6828 Body mass index (BMI) 28.0-28.9, adult: Secondary | ICD-10-CM | POA: Diagnosis not present

## 2015-10-31 DIAGNOSIS — M545 Low back pain: Secondary | ICD-10-CM | POA: Diagnosis not present

## 2015-10-31 DIAGNOSIS — M797 Fibromyalgia: Secondary | ICD-10-CM | POA: Diagnosis not present

## 2015-10-31 DIAGNOSIS — Z79899 Other long term (current) drug therapy: Secondary | ICD-10-CM | POA: Diagnosis not present

## 2015-11-10 IMAGING — CT CT HEAD WITHOUT CONTRAST
1 series · 16 of 30 positions shown, 20 images · non-contrast
Comparison: 09/09/2014.

CLINICAL DATA: Altered mental status. Lightheadedness. Multiple
falls recently.

EXAM:
CT HEAD WITHOUT CONTRAST
TECHNIQUE: Contiguous axial images were obtained from the base of the skull
through the vertex without intravenous contrast.

[Series 2: head wo · axial · 0.46mm/px · z∈[-187,-61]mm · 16 of 32 slices shown, 20 images]
[im 2/32  brain]
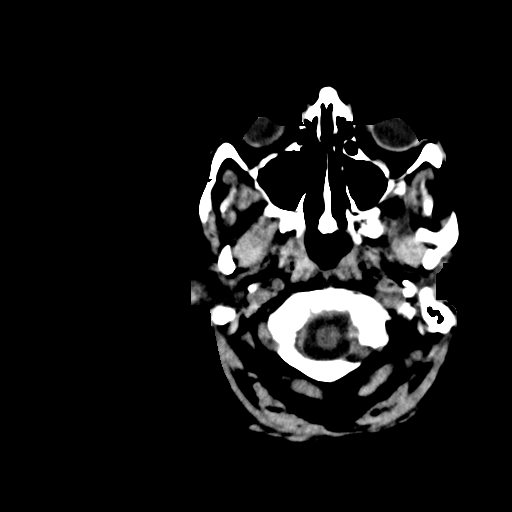
[im 2/32  bone]
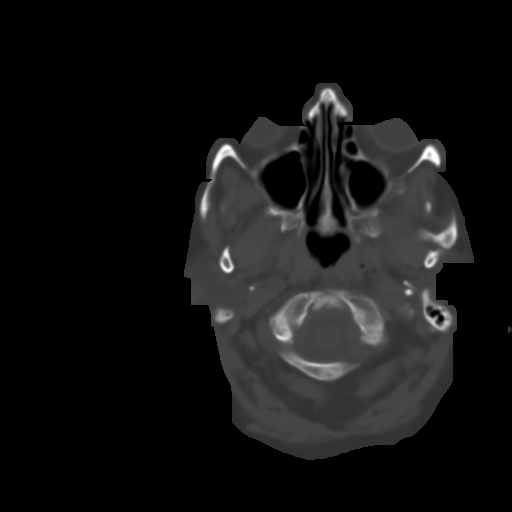
[im 4/32  brain]
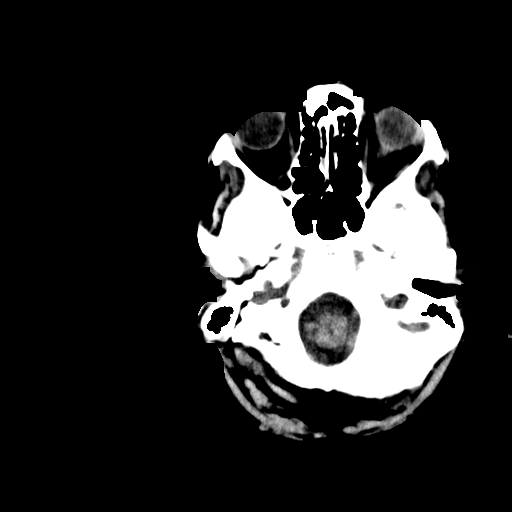
[im 6/32  brain]
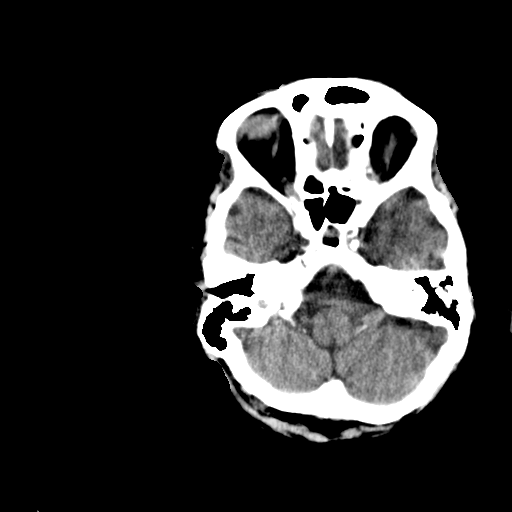
[im 8/32  brain]
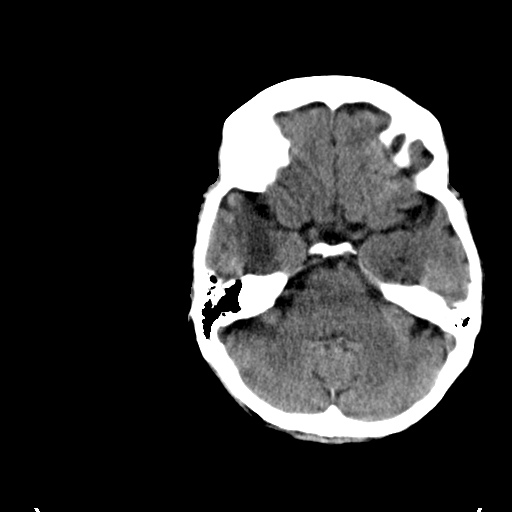
[im 9/32  brain]
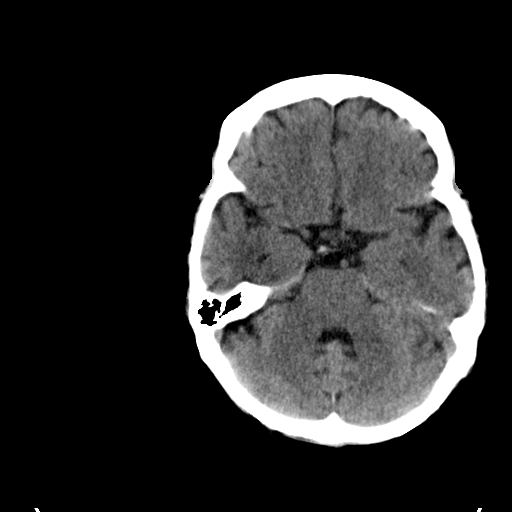
[im 9/32  bone]
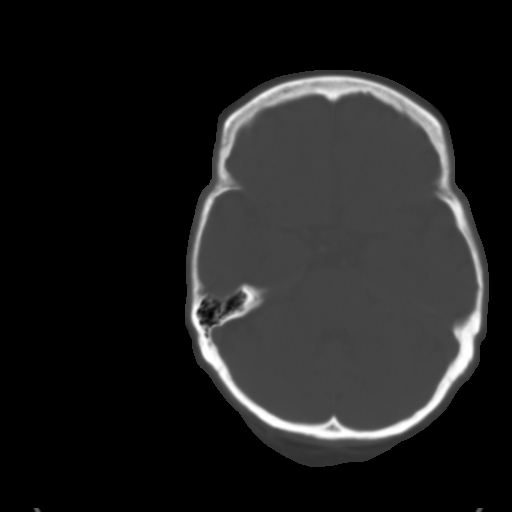
[im 11/32  brain]
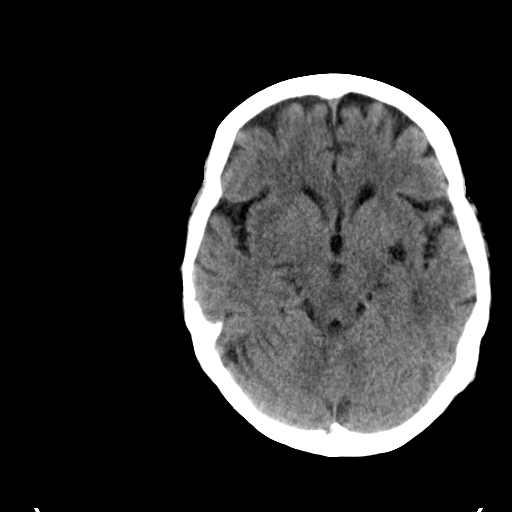
[im 13/32  brain]
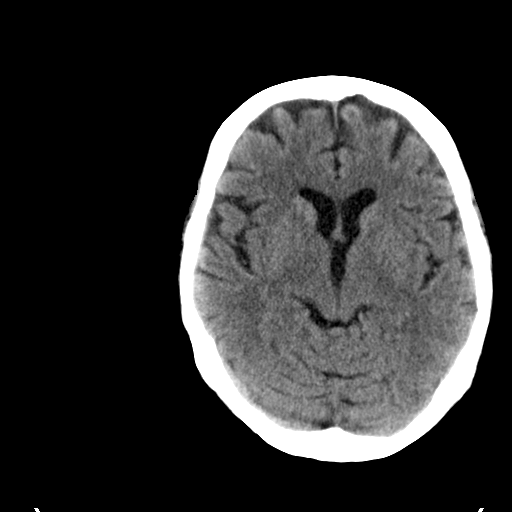
[im 15/32  brain]
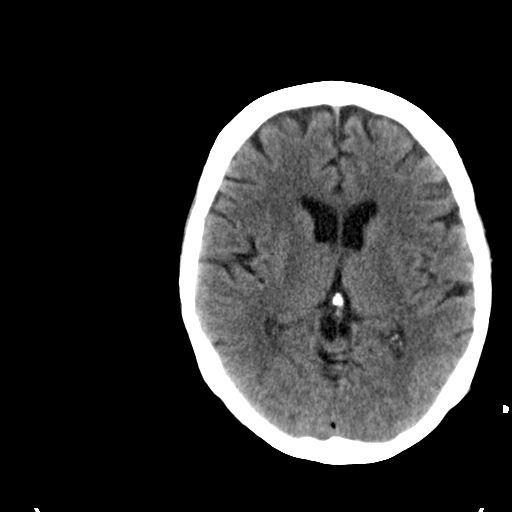
[im 17/32  brain]
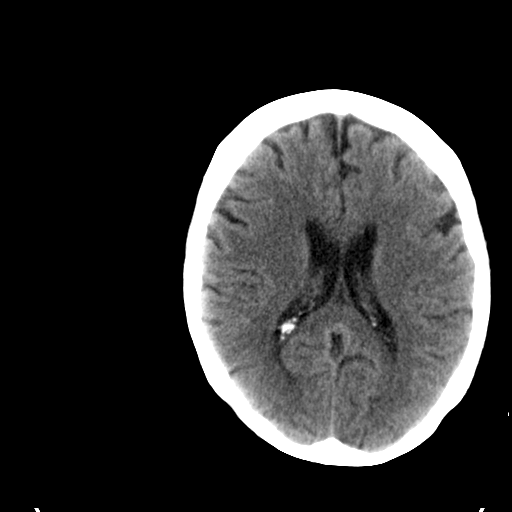
[im 17/32  bone]
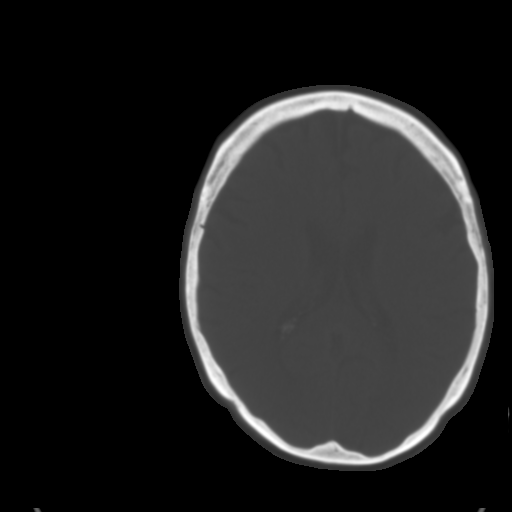
[im 19/32  brain]
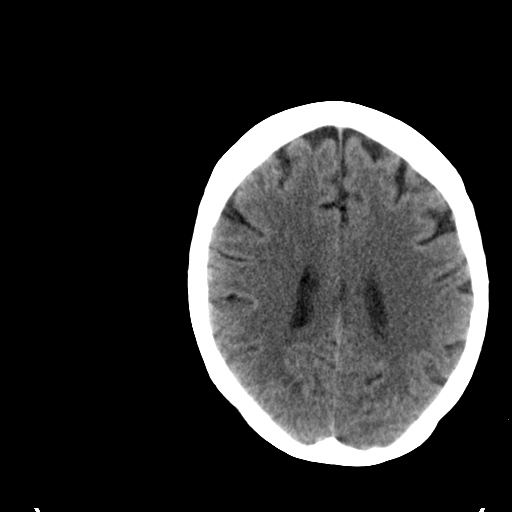
[im 21/32  brain]
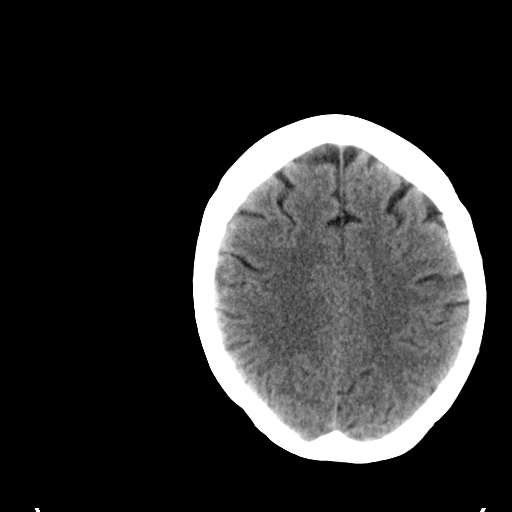
[im 23/32  brain]
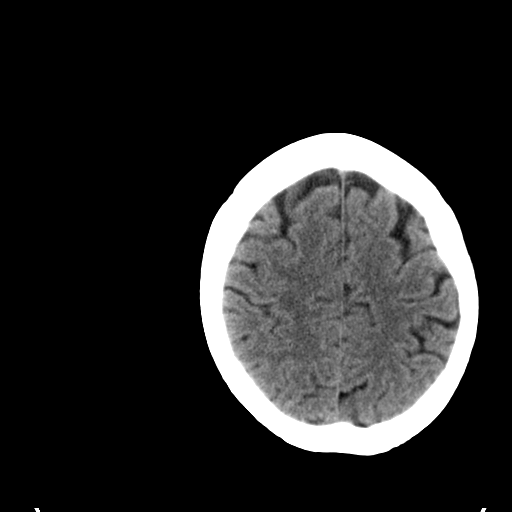
[im 24/32  brain]
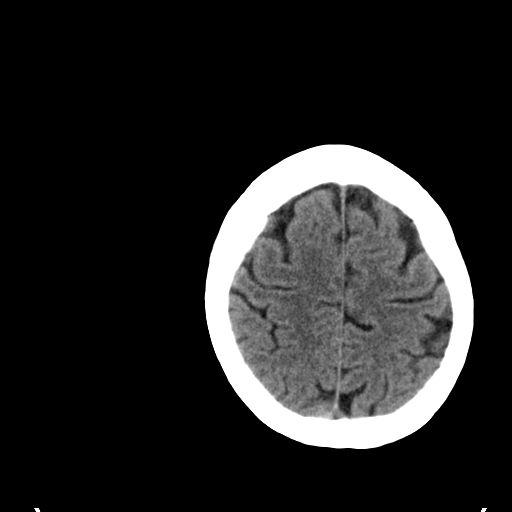
[im 24/32  bone]
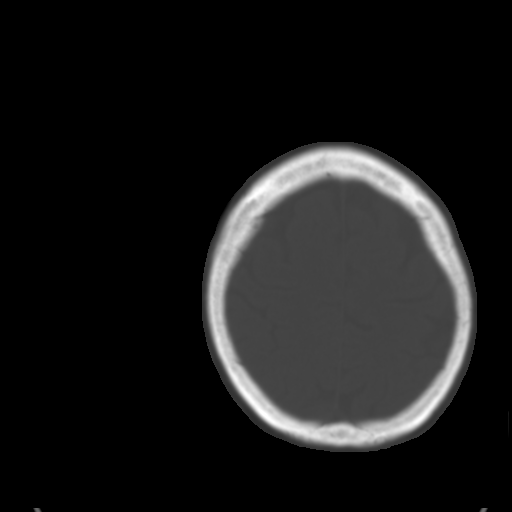
[im 26/32  brain]
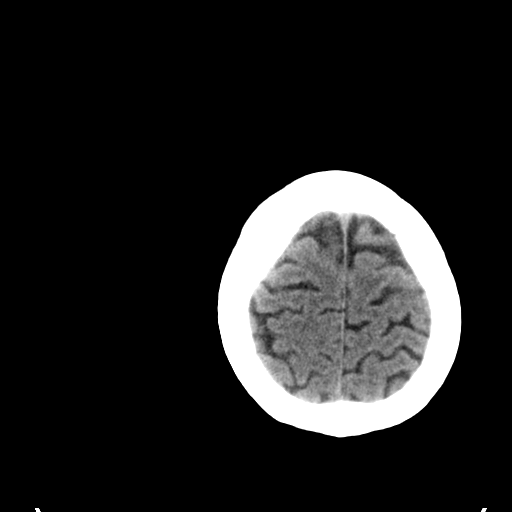
[im 28/32  brain]
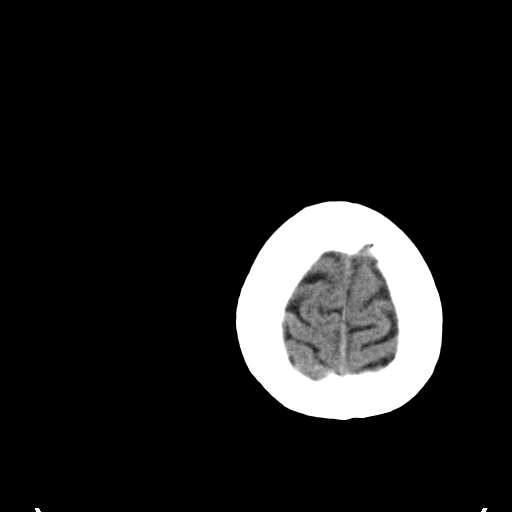
[im 30/32  brain]
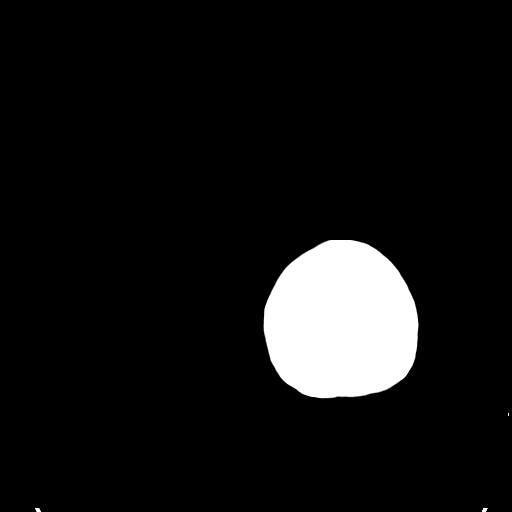

[16 of 30 positions shown; findings below may reference images not displayed]

FINDINGS: No mass lesion, mass effect, midline shift, hydrocephalus,
hemorrhage. No territorial ischemia or acute infarction.
Intracranial atherosclerosis. Mild mucosal thickening in the RIGHT
ethmoid air cells. No skull fracture.
IMPRESSION: Negative CT head.

## 2015-11-10 IMAGING — CR DG CHEST 2V
1 series · 2 of 2 positions shown · non-contrast
Comparison: Chest x-ray 08/02/2014.

CLINICAL DATA: 68-year-old female found down on the floor, possible
syncope.

EXAM:
CHEST  2 VIEW

[Series 1: dxr chest pa (or ap) and lateral · 0.14mm/px · 2 of 2 slices shown]
[im 1/2]
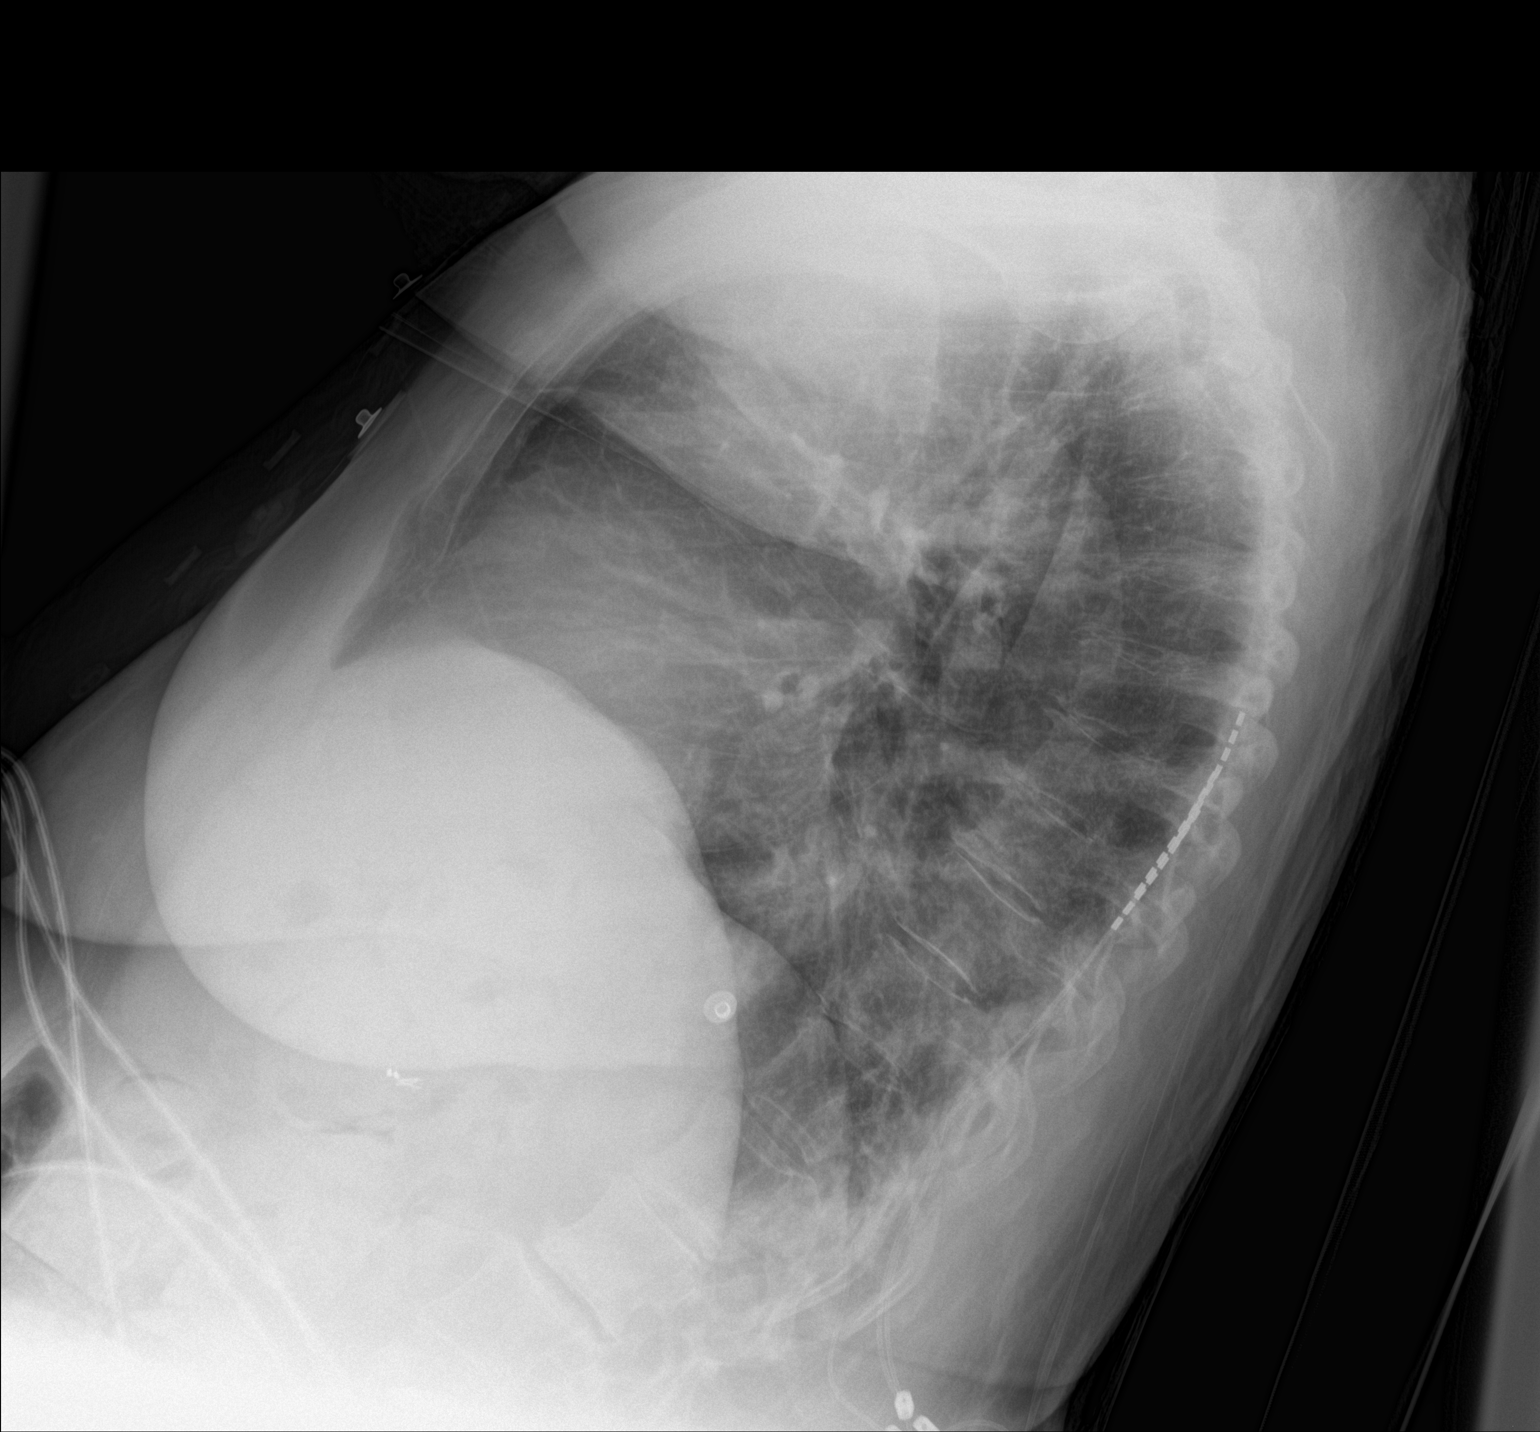
[im 2/2]
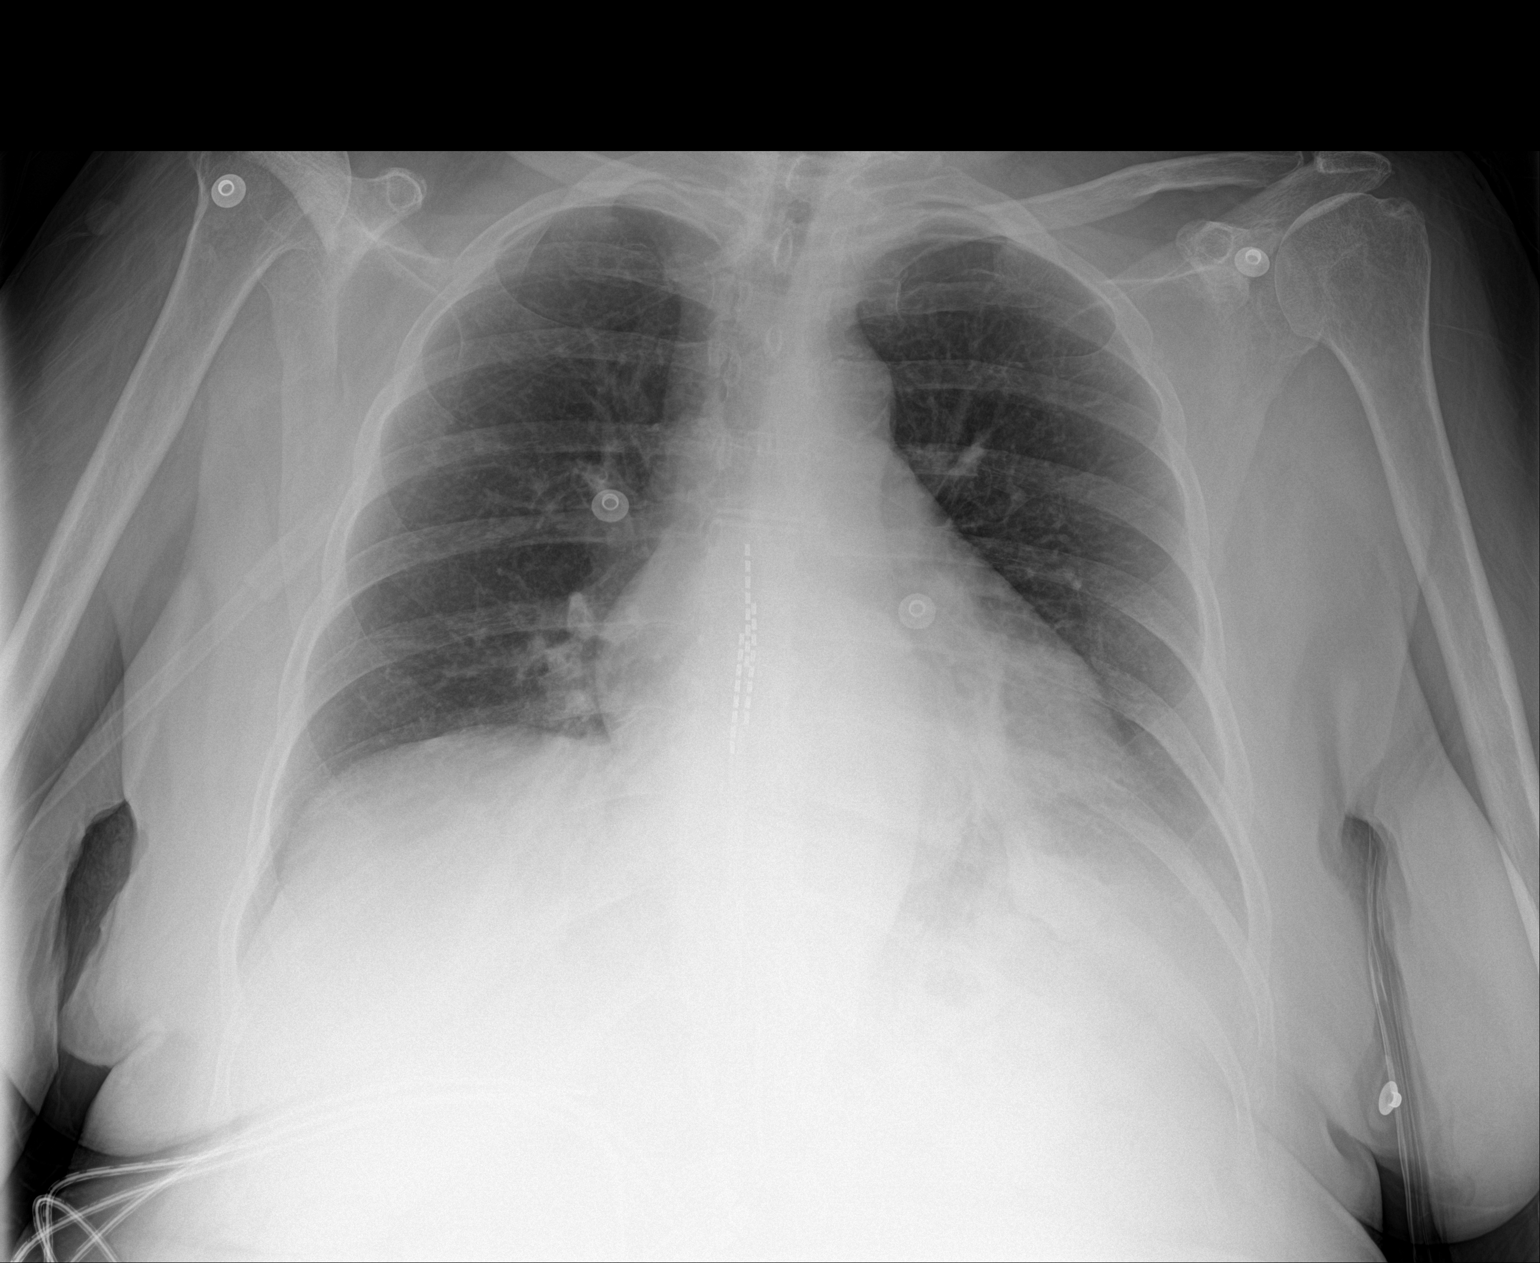

[2 of 2 positions shown; findings below may reference images not displayed]

FINDINGS: Ill-defined opacity in the medial aspect of the left lower lobe. No
definite pleural effusions. No evidence of pulmonary edema. Heart
size is borderline enlarged. The patient is rotated to the left on
today's exam, resulting in distortion of the mediastinal contours
and reduced diagnostic sensitivity and specificity for mediastinal
pathology. Atherosclerosis in the thoracic aorta. Moderate to large
hiatal hernia. Spinal cord stimulator projecting over the mid
thoracic spine. Surgical clips project over the right upper quadrant
of the abdomen, presumably from prior cholecystectomy.
IMPRESSION: 1. Ill-defined opacity in the medial aspect of the left lower lobe
concerning for developing infection or sequela of recent aspiration.
2. Atherosclerosis.
3. Moderate to large hiatal hernia.

## 2015-11-11 DIAGNOSIS — Z79899 Other long term (current) drug therapy: Secondary | ICD-10-CM | POA: Diagnosis not present

## 2015-11-25 ENCOUNTER — Other Ambulatory Visit: Payer: Self-pay

## 2015-11-25 NOTE — Patient Outreach (Signed)
[facility] Care Management  11/25/2015  Jim DesanctisGeraldine L Lowe [birth year] [id number]   SUBJECTIVE: Telephone call to patient regarding health team advantage referral. HIPAA verified with patient. Discussed and offered [facility] care [facility] services to patient. Patient declined services. Patient states she has a follow up appointment scheduled with her primary care provider this week and follows up with her doctors frequently. Patient states she is managing her health conditions at this time. Patient agreed to [facility]/brochure for future reference.  RNCM contacted patients primary provider's office, Spoke with Morrie Sheldonshley and notified her of patients refusal to services. Requested assistance with engaging patient to services.   PLAN: RNCM will refer patient to Sherle Poeicole Robinson to close due to refusal of services.  RNCM notified patients primary provider in writing of refusal of services.   George InaDavina Kristan Brummitt RN,BSN,CCM [facility] Telephonic  [phone]

## 2015-11-28 DIAGNOSIS — Z6828 Body mass index (BMI) 28.0-28.9, adult: Secondary | ICD-10-CM | POA: Diagnosis not present

## 2015-11-28 DIAGNOSIS — R5383 Other fatigue: Secondary | ICD-10-CM | POA: Diagnosis not present

## 2015-11-28 DIAGNOSIS — M545 Low back pain: Secondary | ICD-10-CM | POA: Diagnosis not present

## 2015-11-28 DIAGNOSIS — N390 Urinary tract infection, site not specified: Secondary | ICD-10-CM | POA: Diagnosis not present

## 2015-11-28 DIAGNOSIS — M797 Fibromyalgia: Secondary | ICD-10-CM | POA: Diagnosis not present

## 2015-11-28 DIAGNOSIS — E782 Mixed hyperlipidemia: Secondary | ICD-10-CM | POA: Diagnosis not present

## 2015-11-28 DIAGNOSIS — D509 Iron deficiency anemia, unspecified: Secondary | ICD-10-CM | POA: Diagnosis not present

## 2015-12-04 ENCOUNTER — Other Ambulatory Visit: Payer: Self-pay | Admitting: Internal Medicine

## 2015-12-29 DIAGNOSIS — I10 Essential (primary) hypertension: Secondary | ICD-10-CM | POA: Diagnosis not present

## 2015-12-29 DIAGNOSIS — R5383 Other fatigue: Secondary | ICD-10-CM | POA: Diagnosis not present

## 2015-12-29 DIAGNOSIS — Z6829 Body mass index (BMI) 29.0-29.9, adult: Secondary | ICD-10-CM | POA: Diagnosis not present

## 2015-12-29 DIAGNOSIS — M545 Low back pain: Secondary | ICD-10-CM | POA: Diagnosis not present

## 2015-12-29 DIAGNOSIS — D509 Iron deficiency anemia, unspecified: Secondary | ICD-10-CM | POA: Diagnosis not present

## 2016-01-17 ENCOUNTER — Emergency Department (HOSPITAL_COMMUNITY)
Admission: EM | Admit: 2016-01-17 | Discharge: 2016-01-17 | Disposition: A | Discharge status: Home / Self Care | Payer: PPO | Attending: Emergency Medicine | Admitting: Emergency Medicine

## 2016-01-17 DIAGNOSIS — F329 Major depressive disorder, single episode, unspecified: Secondary | ICD-10-CM | POA: Diagnosis not present

## 2016-01-17 DIAGNOSIS — G5600 Carpal tunnel syndrome, unspecified upper limb: Secondary | ICD-10-CM | POA: Diagnosis not present

## 2016-01-17 DIAGNOSIS — G8929 Other chronic pain: Secondary | ICD-10-CM | POA: Insufficient documentation

## 2016-01-17 DIAGNOSIS — R404 Transient alteration of awareness: Secondary | ICD-10-CM | POA: Diagnosis not present

## 2016-01-17 DIAGNOSIS — E7800 Pure hypercholesterolemia, unspecified: Secondary | ICD-10-CM | POA: Insufficient documentation

## 2016-01-17 DIAGNOSIS — Z7982 Long term (current) use of aspirin: Secondary | ICD-10-CM | POA: Diagnosis not present

## 2016-01-17 DIAGNOSIS — Z8719 Personal history of other diseases of the digestive system: Secondary | ICD-10-CM | POA: Diagnosis not present

## 2016-01-17 DIAGNOSIS — I10 Essential (primary) hypertension: Secondary | ICD-10-CM | POA: Diagnosis not present

## 2016-01-17 DIAGNOSIS — R1110 Vomiting, unspecified: Secondary | ICD-10-CM | POA: Diagnosis not present

## 2016-01-17 DIAGNOSIS — R1013 Epigastric pain: Secondary | ICD-10-CM | POA: Diagnosis not present

## 2016-01-17 DIAGNOSIS — R110 Nausea: Secondary | ICD-10-CM | POA: Diagnosis not present

## 2016-01-17 DIAGNOSIS — T40695A Adverse effect of other narcotics, initial encounter: Secondary | ICD-10-CM | POA: Diagnosis not present

## 2016-01-17 DIAGNOSIS — R531 Weakness: Secondary | ICD-10-CM | POA: Diagnosis not present

## 2016-01-17 DIAGNOSIS — T402X5A Adverse effect of other opioids, initial encounter: Secondary | ICD-10-CM | POA: Diagnosis not present

## 2016-01-17 DIAGNOSIS — R61 Generalized hyperhidrosis: Secondary | ICD-10-CM | POA: Insufficient documentation

## 2016-01-17 DIAGNOSIS — G629 Polyneuropathy, unspecified: Secondary | ICD-10-CM | POA: Insufficient documentation

## 2016-01-17 DIAGNOSIS — R55 Syncope and collapse: Secondary | ICD-10-CM | POA: Insufficient documentation

## 2016-01-17 DIAGNOSIS — D649 Anemia, unspecified: Secondary | ICD-10-CM | POA: Diagnosis not present

## 2016-01-17 DIAGNOSIS — M797 Fibromyalgia: Secondary | ICD-10-CM | POA: Insufficient documentation

## 2016-01-17 DIAGNOSIS — Z789 Other specified health status: Secondary | ICD-10-CM

## 2016-01-17 DIAGNOSIS — Z79899 Other long term (current) drug therapy: Secondary | ICD-10-CM | POA: Diagnosis not present

## 2016-01-17 DIAGNOSIS — M1990 Unspecified osteoarthritis, unspecified site: Secondary | ICD-10-CM | POA: Diagnosis not present

## 2016-01-17 MED ORDER — SODIUM CHLORIDE 0.9 % IV BOLUS (SEPSIS)
500.0000 mL | Freq: Once | INTRAVENOUS | Status: AC
Start: 2016-01-17 — End: 2016-01-17
  Administered 2016-01-17: 500 mL via INTRAVENOUS

## 2016-01-17 MED ORDER — ONDANSETRON 8 MG PO TBDP: 8.0000 mg | ORAL_TABLET | Freq: Three times a day (TID) | ORAL | Status: AC | PRN

## 2016-01-17 MED ORDER — LORAZEPAM 2 MG/ML IJ SOLN
0.5000 mg | Freq: Once | INTRAMUSCULAR | Status: AC
  Administered 2016-01-17: 0.5 mg via INTRAVENOUS
  Filled 2016-01-17: qty 1

## 2016-01-17 MED ORDER — DIPHENHYDRAMINE HCL 50 MG/ML IJ SOLN
12.5000 mg | Freq: Once | INTRAMUSCULAR | Status: AC
  Administered 2016-01-17: 12.5 mg via INTRAVENOUS
  Filled 2016-01-17: qty 1

## 2016-01-17 MED ORDER — METOCLOPRAMIDE HCL 5 MG/ML IJ SOLN
5.0000 mg | Freq: Once | INTRAMUSCULAR | Status: AC
  Administered 2016-01-17: 5 mg via INTRAVENOUS
  Filled 2016-01-17: qty 2

## 2016-01-17 NOTE — ED Provider Notes (Signed)
CSN: [id number]     Arrival date & time 01/17/16  4098 History   First MD Initiated Contact with Patient 01/17/16 [phone]     Chief Complaint  Patient presents with  . Near Syncope     (Consider location/radiation/quality/duration/timing/severity/associated sxs/prior Treatment) HPI Comments: Patient here after developing vomiting and midepigastric abdominal pain after she took her normal dose of oxycodone this morning. Patient has a history of chronic back pain and was in her normal baseline state of health until she took this medication on an empty stomach. Denies any fever or chills. No diarrhea. No recent illnesses. EMS was called and patient was slightly diaphoretic and was given IV fluids and transported here. Denies any cardiac symptoms.  Patient is a 70 y.o. female presenting with near-syncope. The history is provided by the patient.  Near Syncope    Past Medical History  Diagnosis Date  . Hypertension   . Depression   . Recurrent falls   . Anemia   . IBS (irritable bowel syndrome)   . Erosive esophagitis   . Diverticulosis   . Chronic back pain   . Chronic pain   . Chronic prescription opiate use   . High cholesterol   . GERD (gastroesophageal reflux disease)   . History of blood transfusion X 2    "related to when I passed out & when I was dehydrated"  . Syncope and collapse     "today is the 5th or 6th time in the last year" (01/14/2015)  . History of bleeding peptic ulcer   . History of hiatal hernia   . Osteoarthritis   . Arthritis     "knees" (01/14/2015)  . Fibromyalgia   . Carpal tunnel syndrome   . Peripheral neuropathy     "hands" (01/14/2015)  . Chronic lower back pain    Past Surgical History  Procedure Laterality Date  . Total knee arthroplasty Left 2012  . Total knee arthroplasty Right 08/2014  . Bladder suspension    . Total knee arthroplasty Left   . Lumbar fusion  2007    Dr. Lovell Sheehan  . Laparoscopic cholecystectomy  1980's?  . Back surgery     . Joint replacement    . Abdominal hysterectomy  1970's  . Tubal ligation  1960's  . Bunionectomy Left 1970's?  . Spinal cord stimulator implant  ~ 2014/2015   Family History  Problem Relation Age of Onset  . Hypertension Mother   . Stroke Mother   . Heart disease Father    Social History  Substance Use Topics  . Smoking status: Never Smoker   . Smokeless tobacco: Never Used  . Alcohol Use: No   OB History    No data available     Review of Systems  Cardiovascular: Positive for near-syncope.  All other systems reviewed and are negative.     Allergies  Review of patient's allergies indicates no known allergies.  Home Medications   Prior to Admission medications   Medication Sig Start Date End Date Taking? Authorizing Provider  amLODipine (NORVASC) 10 MG tablet Take 10 mg by mouth daily.    Historical Provider, MD  aspirin EC 81 MG tablet Take 81 mg by mouth daily.    Historical Provider, MD  atorvastatin (LIPITOR) 10 MG tablet Take 1 tablet (10 mg total) by mouth daily at 6 PM. 06/19/15   John Giovanni, MD  ferrous sulfate 325 (65 FE) MG tablet Take 1 tablet (325 mg total) by mouth daily with breakfast.  06/19/15   John GiovanniVasundhra Rathore, MD  ondansetron (ZOFRAN) 8 MG tablet Take 8 mg by mouth every 8 (eight) hours as needed for nausea or vomiting.    Historical Provider, MD  Oxycodone HCl 20 MG TABS Take 1 tablet (20 mg total) by mouth every 6 (six) hours as needed (pain). 06/19/15   John GiovanniVasundhra Rathore, MD  PARoxetine (PAXIL) 30 MG tablet Take 30 mg by mouth daily.    Historical Provider, MD  pregabalin (LYRICA) 150 MG capsule Take 150 mg by mouth 2 (two) times daily.    Historical Provider, MD  PRESCRIPTION MEDICATION Take 1 tablet by mouth daily as needed (stomach).    Historical Provider, MD   SpO2 97% Physical Exam  Constitutional: She is oriented to person, place, and time. She appears well-developed and well-nourished.  Non-toxic appearance. No distress.  HENT:   Head: Normocephalic and atraumatic.  Eyes: Conjunctivae, EOM and lids are normal. Pupils are equal, round, and reactive to light.  Neck: Normal range of motion. Neck supple. No tracheal deviation present. No thyroid mass present.  Cardiovascular: Normal rate, regular rhythm and normal heart sounds.  Exam reveals no gallop.   No murmur heard. Pulmonary/Chest: Effort normal and breath sounds normal. No stridor. No respiratory distress. She has no decreased breath sounds. She has no wheezes. She has no rhonchi. She has no rales.  Abdominal: Soft. Normal appearance and bowel sounds are normal. She exhibits no distension. There is no tenderness. There is no rebound and no CVA tenderness.  Musculoskeletal: Normal range of motion. She exhibits no edema or tenderness.  Neurological: She is alert and oriented to person, place, and time. She has normal strength. No cranial nerve deficit or sensory deficit. GCS eye subscore is 4. GCS verbal subscore is 5. GCS motor subscore is 6.  Skin: Skin is warm and dry. No abrasion and no rash noted.  Psychiatric: She has a normal mood and affect. Her speech is normal and behavior is normal.  Nursing note and vitals reviewed.   ED Course  Procedures (including critical care time) Labs Review Labs Reviewed - No data to display  Imaging Review No results found. I have personally reviewed and evaluated these images and lab results as part of my medical decision-making.   EKG Interpretation   Date/Time:  Saturday January 17 2016 09:00:07 EDT Ventricular Rate:  64 PR Interval:  135 QRS Duration: 96 QT Interval:  433 QTC Calculation: 447 R Axis:   -8 Text Interpretation:  Sinus rhythm No significant change since last  tracing Confirmed by Waylon Koffler  MD, Zayquan Bogard ([id number]) on 01/17/2016 9:15:39 AM      MDM   Final diagnoses:  None    Patient given IV fluids and medications for vomiting and feels better. Stable for discharge to home and return precautions  given    Lorre NickAnthony Aireonna Bauer, MD 01/17/16 1121

## 2016-01-17 NOTE — Discharge Instructions (Signed)
Please take your pain medications with food. Return here for any problems

## 2016-01-17 NOTE — ED Notes (Signed)
Pt. Ambulated to the bathroom gait steady.  Pt. Given paper scrubs to go home with.

## 2016-01-17 NOTE — ED Notes (Addendum)
Pt,. Denies any chest pain  Pt. Took  Oxycodone in the morning, on an empty stomach this sometimes causes her to feel faint.

## 2016-01-17 NOTE — ED Notes (Signed)
Pt arrives from home via [facility], ems reports pt c/o "feeling like passing out." pt had no syncopal episodes but was diaphoretic upon ems arrival. Pt denies pain or sob, no n/v. Pt a/ox4, nad.

## 2016-01-17 NOTE — ED Notes (Signed)
Pt states that she is feeling nauseated again. Pt given emesis bag and RN notified.

## 2016-01-26 DIAGNOSIS — M545 Low back pain: Secondary | ICD-10-CM | POA: Diagnosis not present

## 2016-01-26 DIAGNOSIS — D509 Iron deficiency anemia, unspecified: Secondary | ICD-10-CM | POA: Diagnosis not present

## 2016-01-26 DIAGNOSIS — G4700 Insomnia, unspecified: Secondary | ICD-10-CM | POA: Diagnosis not present

## 2016-01-26 DIAGNOSIS — F329 Major depressive disorder, single episode, unspecified: Secondary | ICD-10-CM | POA: Diagnosis not present

## 2016-01-26 DIAGNOSIS — R55 Syncope and collapse: Secondary | ICD-10-CM | POA: Diagnosis not present

## 2016-02-26 DIAGNOSIS — F329 Major depressive disorder, single episode, unspecified: Secondary | ICD-10-CM | POA: Diagnosis not present

## 2016-02-26 DIAGNOSIS — Z6829 Body mass index (BMI) 29.0-29.9, adult: Secondary | ICD-10-CM | POA: Diagnosis not present

## 2016-02-26 DIAGNOSIS — M545 Low back pain: Secondary | ICD-10-CM | POA: Diagnosis not present

## 2016-02-26 DIAGNOSIS — I10 Essential (primary) hypertension: Secondary | ICD-10-CM | POA: Diagnosis not present

## 2016-02-29 ENCOUNTER — Emergency Department (HOSPITAL_COMMUNITY): Payer: PPO

## 2016-02-29 ENCOUNTER — Encounter (HOSPITAL_COMMUNITY): Payer: Self-pay

## 2016-02-29 ENCOUNTER — Observation Stay (HOSPITAL_COMMUNITY)
Admission: EM | Admit: 2016-02-29 | Discharge: 2016-03-01 | Disposition: A | Discharge status: Home / Self Care | Payer: PPO | Attending: Internal Medicine | Admitting: Internal Medicine

## 2016-02-29 DIAGNOSIS — M545 Low back pain: Principal | ICD-10-CM | POA: Insufficient documentation

## 2016-02-29 DIAGNOSIS — R531 Weakness: Secondary | ICD-10-CM | POA: Diagnosis not present

## 2016-02-29 DIAGNOSIS — R197 Diarrhea, unspecified: Secondary | ICD-10-CM | POA: Diagnosis not present

## 2016-02-29 DIAGNOSIS — E869 Volume depletion, unspecified: Secondary | ICD-10-CM | POA: Insufficient documentation

## 2016-02-29 DIAGNOSIS — E7800 Pure hypercholesterolemia, unspecified: Secondary | ICD-10-CM | POA: Insufficient documentation

## 2016-02-29 DIAGNOSIS — G629 Polyneuropathy, unspecified: Secondary | ICD-10-CM | POA: Diagnosis not present

## 2016-02-29 DIAGNOSIS — G8929 Other chronic pain: Secondary | ICD-10-CM | POA: Diagnosis not present

## 2016-02-29 DIAGNOSIS — K219 Gastro-esophageal reflux disease without esophagitis: Secondary | ICD-10-CM | POA: Insufficient documentation

## 2016-02-29 DIAGNOSIS — Z79891 Long term (current) use of opiate analgesic: Secondary | ICD-10-CM | POA: Insufficient documentation

## 2016-02-29 DIAGNOSIS — J40 Bronchitis, not specified as acute or chronic: Secondary | ICD-10-CM | POA: Diagnosis not present

## 2016-02-29 DIAGNOSIS — E876 Hypokalemia: Secondary | ICD-10-CM | POA: Insufficient documentation

## 2016-02-29 DIAGNOSIS — I10 Essential (primary) hypertension: Secondary | ICD-10-CM | POA: Diagnosis not present

## 2016-02-29 DIAGNOSIS — Z96653 Presence of artificial knee joint, bilateral: Secondary | ICD-10-CM | POA: Diagnosis not present

## 2016-02-29 DIAGNOSIS — J209 Acute bronchitis, unspecified: Secondary | ICD-10-CM | POA: Diagnosis not present

## 2016-02-29 DIAGNOSIS — M549 Dorsalgia, unspecified: Secondary | ICD-10-CM | POA: Diagnosis present

## 2016-02-29 DIAGNOSIS — R404 Transient alteration of awareness: Secondary | ICD-10-CM | POA: Diagnosis not present

## 2016-02-29 DIAGNOSIS — Z7982 Long term (current) use of aspirin: Secondary | ICD-10-CM | POA: Diagnosis not present

## 2016-02-29 DIAGNOSIS — F329 Major depressive disorder, single episode, unspecified: Secondary | ICD-10-CM | POA: Diagnosis not present

## 2016-02-29 DIAGNOSIS — Z79899 Other long term (current) drug therapy: Secondary | ICD-10-CM | POA: Diagnosis not present

## 2016-02-29 DIAGNOSIS — R112 Nausea with vomiting, unspecified: Secondary | ICD-10-CM | POA: Diagnosis not present

## 2016-02-29 DIAGNOSIS — M170 Bilateral primary osteoarthritis of knee: Secondary | ICD-10-CM | POA: Diagnosis not present

## 2016-02-29 DIAGNOSIS — R1110 Vomiting, unspecified: Secondary | ICD-10-CM

## 2016-02-29 DIAGNOSIS — R52 Pain, unspecified: Secondary | ICD-10-CM | POA: Diagnosis not present

## 2016-02-29 LAB — BASIC METABOLIC PANEL
Anion gap: 13 (ref 5–15)
BUN: 25 mg/dL — ABNORMAL HIGH (ref 6–20)
CO2: 22 mmol/L (ref 22–32)
Calcium: 9.5 mg/dL (ref 8.9–10.3)
Chloride: 101 mmol/L (ref 101–111)
Creatinine, Ser: 0.7 mg/dL (ref 0.44–1.00)
GFR calc Af Amer: >60 mL/min (ref >60)
GFR calc non Af Amer: >60 mL/min (ref >60)
Glucose, Bld: 104 mg/dL — ABNORMAL HIGH (ref 65–99)
Potassium: 3.2 mmol/L — ABNORMAL LOW (ref 3.5–5.1)
Sodium: 136 mmol/L (ref 135–145)

## 2016-02-29 LAB — CBC WITH DIFFERENTIAL/PLATELET
Basophils Absolute: 0 10*3/uL (ref 0.0–0.1)
Basophils Relative: 0 %
Eosinophils Absolute: 0 10*3/uL (ref 0.0–0.7)
Eosinophils Relative: 0 %
HCT: 40 % (ref 36.0–46.0)
Hemoglobin: 13.8 g/dL (ref 12.0–15.0)
Lymphocytes Relative: 12 %
Lymphs Abs: 1.1 10*3/uL (ref 0.7–4.0)
MCH: 32 pg (ref 26.0–34.0)
MCHC: 34.5 g/dL (ref 30.0–36.0)
MCV: 92.8 fL (ref 78.0–100.0)
Monocytes Absolute: 0.4 10*3/uL (ref 0.1–1.0)
Monocytes Relative: 4 %
Neutro Abs: 7.1 10*3/uL (ref 1.7–7.7)
Neutrophils Relative %: 84 %
Platelets: 342 10*3/uL (ref 150–400)
RBC: 4.31 MIL/uL (ref 3.87–5.11)
RDW: 13.4 % (ref 11.5–15.5)
WBC: 8.5 10*3/uL (ref 4.0–10.5)

## 2016-02-29 LAB — TSH: TSH: 0.301 u[IU]/mL — ABNORMAL LOW (ref 0.350–4.500)

## 2016-02-29 LAB — PHOSPHORUS: Phosphorus: 3.3 mg/dL (ref 2.5–4.6)

## 2016-02-29 LAB — MAGNESIUM: Magnesium: 2.2 mg/dL (ref 1.7–2.4)

## 2016-02-29 MED ORDER — DIAZEPAM 5 MG/ML IJ SOLN: 5.0000 mg | Freq: Once | INTRAMUSCULAR | Status: DC

## 2016-02-29 MED ORDER — TRAZODONE HCL 100 MG PO TABS
100.0000 mg | ORAL_TABLET | Freq: Every day | ORAL | Status: DC
  Administered 2016-02-29: 100 mg via ORAL
  Filled 2016-02-29 (×2): qty 1

## 2016-02-29 MED ORDER — ALBUTEROL SULFATE HFA 108 (90 BASE) MCG/ACT IN AERS
1.0000 | INHALATION_SPRAY | RESPIRATORY_TRACT | Status: DC | PRN
  Administered 2016-02-29: 2 via RESPIRATORY_TRACT
  Filled 2016-02-29: qty 6.7

## 2016-02-29 MED ORDER — HYDROMORPHONE HCL 1 MG/ML IJ SOLN: 1.0000 mg | Freq: Once | INTRAMUSCULAR | Status: DC

## 2016-02-29 MED ORDER — OXYCODONE HCL 5 MG PO TABS
20.0000 mg | ORAL_TABLET | Freq: Four times a day (QID) | ORAL | Status: DC | PRN
Start: 2016-02-29 — End: 2016-03-01
  Administered 2016-02-29 – 2016-03-01 (×2): 20 mg via ORAL
  Filled 2016-02-29 (×2): qty 4

## 2016-02-29 MED ORDER — IBUPROFEN 200 MG PO TABS
400.0000 mg | ORAL_TABLET | Freq: Once | ORAL | Status: AC
  Administered 2016-02-29: 400 mg via ORAL
  Filled 2016-02-29: qty 2

## 2016-02-29 MED ORDER — PANTOPRAZOLE SODIUM 40 MG PO TBEC
40.0000 mg | DELAYED_RELEASE_TABLET | Freq: Every day | ORAL | Status: DC
  Administered 2016-02-29 – 2016-03-01 (×2): 40 mg via ORAL
  Filled 2016-02-29 (×2): qty 1

## 2016-02-29 MED ORDER — DEXAMETHASONE 4 MG PO TABS
12.0000 mg | ORAL_TABLET | Freq: Once | ORAL | Status: AC
  Administered 2016-02-29: 12 mg via ORAL
  Filled 2016-02-29: qty 3

## 2016-02-29 MED ORDER — PAROXETINE HCL 30 MG PO TABS
30.0000 mg | ORAL_TABLET | Freq: Every day | ORAL | Status: DC
  Administered 2016-02-29 – 2016-03-01 (×2): 30 mg via ORAL
  Filled 2016-02-29 (×2): qty 1

## 2016-02-29 MED ORDER — FERROUS SULFATE 325 (65 FE) MG PO TABS
325.0000 mg | ORAL_TABLET | Freq: Every day | ORAL | Status: DC
  Administered 2016-03-01: 325 mg via ORAL
  Filled 2016-02-29: qty 1

## 2016-02-29 MED ORDER — ONDANSETRON HCL 4 MG/2ML IJ SOLN: 4.0000 mg | Freq: Four times a day (QID) | INTRAMUSCULAR | Status: DC | PRN

## 2016-02-29 MED ORDER — ASPIRIN EC 81 MG PO TBEC
81.0000 mg | DELAYED_RELEASE_TABLET | Freq: Every day | ORAL | Status: DC
  Administered 2016-02-29 – 2016-03-01 (×2): 81 mg via ORAL
  Filled 2016-02-29 (×2): qty 1

## 2016-02-29 MED ORDER — PREGABALIN 75 MG PO CAPS
150.0000 mg | ORAL_CAPSULE | Freq: Two times a day (BID) | ORAL | Status: DC
Start: 2016-02-29 — End: 2016-03-01
  Administered 2016-02-29 – 2016-03-01 (×2): 150 mg via ORAL
  Filled 2016-02-29 (×2): qty 2

## 2016-02-29 MED ORDER — ENOXAPARIN SODIUM 40 MG/0.4ML ~~LOC~~ SOLN
40.0000 mg | SUBCUTANEOUS | Status: DC
  Administered 2016-02-29: 40 mg via SUBCUTANEOUS
  Filled 2016-02-29: qty 0.4

## 2016-02-29 MED ORDER — HYDROMORPHONE HCL 1 MG/ML IJ SOLN
1.0000 mg | Freq: Once | INTRAMUSCULAR | Status: AC
  Administered 2016-02-29: 1 mg via INTRAVENOUS
  Filled 2016-02-29: qty 1

## 2016-02-29 MED ORDER — DEXAMETHASONE 6 MG PO TABS: 12.0000 mg | ORAL_TABLET | Freq: Once | ORAL | Status: DC

## 2016-02-29 MED ORDER — ALBUTEROL SULFATE (2.5 MG/3ML) 0.083% IN NEBU: 2.5000 mg | INHALATION_SOLUTION | Freq: Four times a day (QID) | RESPIRATORY_TRACT | Status: DC | PRN

## 2016-02-29 MED ORDER — POTASSIUM CHLORIDE CRYS ER 20 MEQ PO TBCR
40.0000 meq | EXTENDED_RELEASE_TABLET | Freq: Once | ORAL | Status: AC
  Administered 2016-02-29: 40 meq via ORAL
  Filled 2016-02-29: qty 2

## 2016-02-29 MED ORDER — ATORVASTATIN CALCIUM 20 MG PO TABS
20.0000 mg | ORAL_TABLET | Freq: Every day | ORAL | Status: DC
  Administered 2016-02-29 – 2016-03-01 (×2): 20 mg via ORAL
  Filled 2016-02-29 (×2): qty 1

## 2016-02-29 MED ORDER — SODIUM CHLORIDE 0.9 % IV SOLN
INTRAVENOUS | Status: DC
  Administered 2016-02-29 – 2016-03-01 (×2): via INTRAVENOUS

## 2016-02-29 MED ORDER — AMLODIPINE BESYLATE 10 MG PO TABS
10.0000 mg | ORAL_TABLET | Freq: Every day | ORAL | Status: DC
  Administered 2016-02-29 – 2016-03-01 (×2): 10 mg via ORAL
  Filled 2016-02-29 (×2): qty 1

## 2016-02-29 MED ORDER — POTASSIUM CHLORIDE ER 10 MEQ PO TBCR
20.0000 meq | EXTENDED_RELEASE_TABLET | Freq: Every day | ORAL | Status: DC
  Administered 2016-02-29 – 2016-03-01 (×2): 20 meq via ORAL
  Filled 2016-02-29 (×3): qty 2

## 2016-02-29 MED ORDER — ALBUTEROL SULFATE (2.5 MG/3ML) 0.083% IN NEBU
2.5000 mg | INHALATION_SOLUTION | Freq: Once | RESPIRATORY_TRACT | Status: AC
  Administered 2016-02-29: 2.5 mg via RESPIRATORY_TRACT
  Filled 2016-02-29: qty 3

## 2016-02-29 MED ORDER — POTASSIUM CHLORIDE 10 MEQ/100ML IV SOLN
10.0000 meq | INTRAVENOUS | Status: AC
  Administered 2016-02-29: 10 meq via INTRAVENOUS
  Filled 2016-02-29: qty 100

## 2016-02-29 MED ORDER — OXYCODONE-ACETAMINOPHEN 5-325 MG PO TABS
1.0000 | ORAL_TABLET | Freq: Once | ORAL | Status: AC
Start: 2016-02-29 — End: 2016-02-29
  Administered 2016-02-29: 1 via ORAL
  Filled 2016-02-29: qty 1

## 2016-02-29 NOTE — ED Notes (Signed)
Patient transported to X-ray 

## 2016-02-29 NOTE — ED Notes (Signed)
Bed: [id number] Expected date:  Expected time:  Means of arrival:  Comments: 70 yo malaise, wants eval

## 2016-02-29 NOTE — ED Notes (Signed)
Not able to get urine from pt X 3 time

## 2016-02-29 NOTE — ED Notes (Signed)
BLOOD CULTURE X 1 OBTAINED 

## 2016-02-29 NOTE — ED Notes (Signed)
Per [facility] Pt reports generalized body aches, chills, slight nausea, with ? Fever since Thursday. OTC meds with relief however seeking evaluation for ? Flu- like symptoms. Denies CP/SOB and pain. Pt has generalized weakness with standing.

## 2016-02-29 NOTE — ED Provider Notes (Signed)
CSN: [id number]     Arrival date & time 02/29/16  1018 History   First MD Initiated Contact with Patient 02/29/16 1030     Chief Complaint  Patient presents with  . Generalized Body Aches  . Chills  . Nausea  . Cough     (Consider location/radiation/quality/duration/timing/severity/associated sxs/prior Treatment) HPI   69yF with cough and congestion. Symptom onset around Thursday. Subjective fever. She thinks may have the flu. Cough is nonproductive. Mild shortness of breath. Chest pain. She feels generally very weak. This progressed the point where she is having difficulty with just standing. Mild nausea, but no vomiting. Generalized body aches. No specific urinary complaints.  Past Medical History  Diagnosis Date  . Hypertension   . Depression   . Recurrent falls   . Anemia   . IBS (irritable bowel syndrome)   . Erosive esophagitis   . Diverticulosis   . Chronic back pain   . Chronic pain   . Chronic prescription opiate use   . High cholesterol   . GERD (gastroesophageal reflux disease)   . History of blood transfusion X 2    "related to when I passed out & when I was dehydrated"  . Syncope and collapse     "today is the 5th or 6th time in the last year" (01/14/2015)  . History of bleeding peptic ulcer   . History of hiatal hernia   . Osteoarthritis   . Arthritis     "knees" (01/14/2015)  . Fibromyalgia   . Carpal tunnel syndrome   . Peripheral neuropathy (HCC)     "hands" (01/14/2015)  . Chronic lower back pain    Past Surgical History  Procedure Laterality Date  . Total knee arthroplasty Left 2012  . Total knee arthroplasty Right 08/2014  . Bladder suspension    . Total knee arthroplasty Left   . Lumbar fusion  2007    Dr. Lovell SheehanJenkins  . Laparoscopic cholecystectomy  1980's?  . Back surgery    . Joint replacement    . Abdominal hysterectomy  1970's  . Tubal ligation  1960's  . Bunionectomy Left 1970's?  . Spinal cord stimulator implant  ~ 2014/2015   Family  History  Problem Relation Age of Onset  . Hypertension Mother   . Stroke Mother   . Heart disease Father    Social History  Substance Use Topics  . Smoking status: Never Smoker   . Smokeless tobacco: Never Used  . Alcohol Use: No   OB History    No data available     Review of Systems  All systems reviewed and negative, other than as noted in HPI.   Allergies  Review of patient's allergies indicates no known allergies.  Home Medications   Prior to Admission medications   Medication Sig Start Date End Date Taking? Authorizing Provider  acetaminophen (TYLENOL) 500 MG tablet Take 500 mg by mouth every 6 (six) hours as needed for moderate pain or headache.   Yes Historical Provider, MD  albuterol (VENTOLIN HFA) 108 (90 Base) MCG/ACT inhaler Inhale 2 puffs into the lungs every 6 (six) hours as needed. Wheezing and shortness of breath 10/18/12  Yes Historical Provider, MD  amitriptyline (ELAVIL) 10 MG tablet Take 10 mg by mouth at bedtime as needed. Sleep/depression 02/06/16  Yes Historical Provider, MD  amLODipine (NORVASC) 10 MG tablet Take 10 mg by mouth daily.   Yes Historical Provider, MD  aspirin EC 81 MG tablet Take 81 mg by mouth daily.  Yes Historical Provider, MD  atorvastatin (LIPITOR) 20 MG tablet Take 20 mg by mouth daily. 02/17/16  Yes Historical Provider, MD  ferrous sulfate 325 (65 FE) MG tablet Take 1 tablet (325 mg total) by mouth daily with breakfast. 06/19/15  Yes John Giovanni, MD  gabapentin (NEURONTIN) 800 MG tablet Take 800 mg by mouth 3 (three) times daily as needed. Pain 02/12/16  Yes Historical Provider, MD  KLOR-CON 10 10 MEQ tablet Take 20 mEq by mouth daily. 02/19/16  Yes Historical Provider, MD  ondansetron (ZOFRAN ODT) 8 MG disintegrating tablet Take 1 tablet (8 mg total) by mouth every 8 (eight) hours as needed for nausea or vomiting. 01/17/16  Yes Lorre Nick, MD  Oxycodone HCl 20 MG TABS Take 1 tablet (20 mg total) by mouth every 6 (six) hours as  needed (pain). 06/19/15  Yes John Giovanni, MD  pantoprazole (PROTONIX) 40 MG tablet Take 40 mg by mouth daily. 02/06/16  Yes Historical Provider, MD  PARoxetine (PAXIL) 30 MG tablet Take 30 mg by mouth daily.   Yes Historical Provider, MD  pregabalin (LYRICA) 150 MG capsule Take 150 mg by mouth 2 (two) times daily.   Yes Historical Provider, MD  traZODone (DESYREL) 100 MG tablet Take 100 mg by mouth at bedtime. 01/28/16  Yes Historical Provider, MD  atorvastatin (LIPITOR) 10 MG tablet Take 1 tablet (10 mg total) by mouth daily at 6 PM. Patient not taking: Reported on 02/29/2016 06/19/15   John Giovanni, MD  dexamethasone (DECADRON) 6 MG tablet Take 2 tablets (12 mg total) by mouth once. 02/29/16   Raeford Razor, MD   BP 171/96 mmHg  Pulse 61  Temp(Src) 98.4 F (36.9 C) (Oral)  Resp 19  Ht  (1.549 m)  Wt 130 lb (58.968 kg)  BMI 24.58 kg/m2  SpO2 99% Physical Exam  Constitutional: She appears well-developed and well-nourished. No distress.  HENT:  Head: Normocephalic and atraumatic.  Eyes: Conjunctivae are normal. Right eye exhibits no discharge. Left eye exhibits no discharge.  Neck: Neck supple.  Cardiovascular: Normal rate, regular rhythm and normal heart sounds.  Exam reveals no gallop and no friction rub.   No murmur heard. Pulmonary/Chest: Effort normal and breath sounds normal. No respiratory distress.  Faint expiratory wheezing  Abdominal: Soft. She exhibits no distension. There is no tenderness.  Musculoskeletal: She exhibits no edema or tenderness.  Mild tenderness across the lumbar spine both midline and paraspinally.  Neurological: She is alert.  Skin: Skin is warm and dry.  Psychiatric: She has a normal mood and affect. Her behavior is normal. Thought content normal.  Nursing note and vitals reviewed.   ED Course  Procedures (including critical care time) Labs Review Labs Reviewed  BASIC METABOLIC PANEL - Abnormal; Notable for the following:    Potassium  3.2 (*)    Glucose, Bld 104 (*)    BUN 25 (*)    All other components within normal limits  CBC WITH DIFFERENTIAL/PLATELET  URINALYSIS, ROUTINE W REFLEX MICROSCOPIC (NOT AT [facility])    Imaging Review Dg Chest 2 View  02/29/2016  CLINICAL DATA:  Generalized body aches EXAM: CHEST  2 VIEW COMPARISON:  06/18/2015 FINDINGS: Cardiomediastinal silhouette is stable. Spinal wire stimulators mid thoracic spine again noted. No acute infiltrate or pulmonary edema. Moderate size hiatal hernia again noted. Mild degenerative changes mid thoracic spine. IMPRESSION: No active cardiopulmonary disease. Electronically Signed   By: Natasha Mead M.D.   On: 02/29/2016 11:39   I have personally reviewed  and evaluated these images and lab results as part of my medical decision-making.   EKG Interpretation None      MDM   Final diagnoses:  Bronchitis  Low back pain, unspecified back pain laterality, with sciatica presence unspecified  Vomiting and diarrhea    Every time I reassess her she seems to have new complaints. She seems very reluctant to be discharged. She says she just doesn't feel comfortable with going home. I tried reassuring her several times. Her respiratory complaints I suspect are from viral bronchitis. Mild wheezing on exam which has since resolved. Now complaining of lower back and b/l LE pain down into feet. She reports chronic back pain issues, but this is worse. Hsitory lists chronic back pain and fibromyalgia. Neuro exam is nonfocal. I doubt emergent cause of her symptoms. Will medicate some more and try to further reassure.  She keeps mentioning her ex-husband is in the hospital at [facility] and sounds like he may be pretty sick. I wonder how much this may be playing into her reluctance to be discharged?  Now she is having diarrhea. Vomited after given dilaudid. Says she is too weak to walk. She lives alone. Will discuss with medicine for possible admission for symptom control.     Raeford Razor, MD 03/16/16 1146

## 2016-02-29 NOTE — ED Notes (Signed)
MD at bedside. 

## 2016-02-29 NOTE — ED Notes (Signed)
MD at bedside. ADMISSION MD PRESENT 

## 2016-02-29 NOTE — H&P (Signed)
History and Physical  Jim DesanctisGeraldine L Compean ZOX:[id number] DOB: [birth year] DOA: 02/29/2016  Referring physician: ER Physician PCP: Arlyss QueenONROY,NATHAN, PA-C  Outpatient Specialists:    Patient coming from: Home  Chief Complaint: Nausea and vomiting  HPI: 70 year old Caucasian female with history of depression and chronic back pain, currently home alone, presents with nausea and vomiting. No associated headache, no neck pain, no chest pain, no fever or chills, no diarrhea and no urinary symptoms. On further questioning patient tells me that she had cough that was productive of yellowish sputum.  ED Course:   Pertinent labs: Potassium of 3.2. EKG: Independently reviewed.   Review of Systems: As in HPI. Negative for fever, visual changes, sore throat, rash, new muscle aches, chest pain, SOB, dysuria, bleeding.  Past Medical History  Diagnosis Date  . Hypertension   . Depression   . Recurrent falls   . Anemia   . IBS (irritable bowel syndrome)   . Erosive esophagitis   . Diverticulosis   . Chronic back pain   . Chronic pain   . Chronic prescription opiate use   . High cholesterol   . GERD (gastroesophageal reflux disease)   . History of blood transfusion X 2    "related to when I passed out & when I was dehydrated"  . Syncope and collapse     "today is the 5th or 6th time in the last year" (01/14/2015)  . History of bleeding peptic ulcer   . History of hiatal hernia   . Osteoarthritis   . Arthritis     "knees" (01/14/2015)  . Fibromyalgia   . Carpal tunnel syndrome   . Peripheral neuropathy (HCC)     "hands" (01/14/2015)  . Chronic lower back pain     Past Surgical History  Procedure Laterality Date  . Total knee arthroplasty Left 2012  . Total knee arthroplasty Right 08/2014  . Bladder suspension    . Total knee arthroplasty Left   . Lumbar fusion  2007    Dr. Lovell SheehanJenkins  . Laparoscopic cholecystectomy  1980's?  . Back surgery    . Joint replacement    . Abdominal  hysterectomy  1970's  . Tubal ligation  1960's  . Bunionectomy Left 1970's?  . Spinal cord stimulator implant  ~ 2014/2015     reports that she has never smoked. She has never used smokeless tobacco. She reports that she does not drink alcohol or use illicit drugs.  No Known Allergies  Family History  Problem Relation Age of Onset  . Hypertension Mother   . Stroke Mother   . Heart disease Father      Prior to Admission medications   Medication Sig Start Date End Date Taking? Authorizing Provider  acetaminophen (TYLENOL) 500 MG tablet Take 500 mg by mouth every 6 (six) hours as needed for moderate pain or headache.   Yes Historical Provider, MD  albuterol (VENTOLIN HFA) 108 (90 Base) MCG/ACT inhaler Inhale 2 puffs into the lungs every 6 (six) hours as needed. Wheezing and shortness of breath 10/18/12  Yes Historical Provider, MD  amitriptyline (ELAVIL) 10 MG tablet Take 10 mg by mouth at bedtime as needed. Sleep/depression 02/06/16  Yes Historical Provider, MD  amLODipine (NORVASC) 10 MG tablet Take 10 mg by mouth daily.   Yes Historical Provider, MD  aspirin EC 81 MG tablet Take 81 mg by mouth daily.   Yes Historical Provider, MD  atorvastatin (LIPITOR) 20 MG tablet Take 20 mg by mouth daily. 02/17/16  Yes Historical Provider, MD  ferrous sulfate 325 (65 FE) MG tablet Take 1 tablet (325 mg total) by mouth daily with breakfast. 06/19/15  Yes John Giovanni, MD  gabapentin (NEURONTIN) 800 MG tablet Take 800 mg by mouth 3 (three) times daily as needed. Pain 02/12/16  Yes Historical Provider, MD  KLOR-CON 10 10 MEQ tablet Take 20 mEq by mouth daily. 02/19/16  Yes Historical Provider, MD  ondansetron (ZOFRAN ODT) 8 MG disintegrating tablet Take 1 tablet (8 mg total) by mouth every 8 (eight) hours as needed for nausea or vomiting. 01/17/16  Yes Lorre Nick, MD  Oxycodone HCl 20 MG TABS Take 1 tablet (20 mg total) by mouth every 6 (six) hours as needed (pain). 06/19/15  Yes John Giovanni, MD    pantoprazole (PROTONIX) 40 MG tablet Take 40 mg by mouth daily. 02/06/16  Yes Historical Provider, MD  PARoxetine (PAXIL) 30 MG tablet Take 30 mg by mouth daily.   Yes Historical Provider, MD  pregabalin (LYRICA) 150 MG capsule Take 150 mg by mouth 2 (two) times daily.   Yes Historical Provider, MD  traZODone (DESYREL) 100 MG tablet Take 100 mg by mouth at bedtime. 01/28/16  Yes Historical Provider, MD  dexamethasone (DECADRON) 6 MG tablet Take 2 tablets (12 mg total) by mouth once. 02/29/16   Raeford Razor, MD    Physical Exam: Filed Vitals:   02/29/16 1630 02/29/16 1637 02/29/16 1639 02/29/16 1700  BP: 153/96 153/96 153/96 161/88  Pulse: 70 72 72 67  Temp:    98.2 F (36.8 C)  TempSrc:    Oral  Resp:   18 16  Height:      Weight:      SpO2: 97% 97% 97% 96%     Constitutional:  . Appears calm and comfortable Eyes:  . PERRL and irises appear normal ENMT:  . external ears, nose appear normal Neck:  neck is supple. No JVD.  Respiratory:  . CTA bilaterally, no w/r/r.  Cardiovascular:  . RRR, no m/r/g . No LE extremity edema   Abdomen:  Abdomen is soft and non tender.  Neurologic:  . Awake and alert. . Moves all limbs.  Wt Readings from Last 3 Encounters:  02/29/16 58.968 kg (130 lb)  06/18/15 61.236 kg (135 lb)  02/21/15 68.675 kg (151 lb 6.4 oz)    I have personally reviewed following labs and imaging studies  Labs on Admission:  CBC:  Recent Labs Lab 02/29/16 1113  WBC 8.5  NEUTROABS 7.1  HGB 13.8  HCT 40.0  MCV 92.8  PLT 342   Basic Metabolic Panel:  Recent Labs Lab 02/29/16 1113  NA 136  K 3.2*  CL 101  CO2 22  GLUCOSE 104*  BUN 25*  CREATININE 0.70  CALCIUM 9.5   Liver Function Tests: No results for input(s): AST, ALT, ALKPHOS, BILITOT, PROT, ALBUMIN in the last 168 hours. No results for input(s): LIPASE, AMYLASE in the last 168 hours. No results for input(s): AMMONIA in the last 168 hours. Coagulation Profile: No results for input(s):  INR, PROTIME in the last 168 hours. Cardiac Enzymes: No results for input(s): CKTOTAL, CKMB, CKMBINDEX, TROPONINI in the last 168 hours. BNP (last 3 results) No results for input(s): PROBNP in the last 8760 hours. HbA1C: No results for input(s): HGBA1C in the last 72 hours. CBG: No results for input(s): GLUCAP in the last 168 hours. Lipid Profile: No results for input(s): CHOL, HDL, LDLCALC, TRIG, CHOLHDL, LDLDIRECT in the last 72 hours. Thyroid  Function Tests: No results for input(s): TSH, T4TOTAL, FREET4, T3FREE, THYROIDAB in the last 72 hours. Anemia Panel: No results for input(s): VITAMINB12, FOLATE, FERRITIN, TIBC, IRON, RETICCTPCT in the last 72 hours. Urine analysis:    Component Value Date/Time   COLORURINE YELLOW 06/18/2015 1318   COLORURINE Yellow 10/14/2014 1708   APPEARANCEUR CLEAR 06/18/2015 1318   APPEARANCEUR Hazy 10/14/2014 1708   LABSPEC 1.018 06/18/2015 1318   LABSPEC 1.016 10/14/2014 1708   PHURINE 5.0 06/18/2015 1318   PHURINE 5.0 10/14/2014 1708   GLUCOSEU NEGATIVE 06/18/2015 1318   GLUCOSEU Negative 10/14/2014 1708   HGBUR NEGATIVE 06/18/2015 1318   HGBUR 1+ 10/14/2014 1708   BILIRUBINUR NEGATIVE 06/18/2015 1318   BILIRUBINUR Negative 10/14/2014 1708   KETONESUR NEGATIVE 06/18/2015 1318   KETONESUR Negative 10/14/2014 1708   PROTEINUR NEGATIVE 06/18/2015 1318   PROTEINUR Negative 10/14/2014 1708   UROBILINOGEN 0.2 06/18/2015 1318   NITRITE NEGATIVE 06/18/2015 1318   NITRITE Positive 10/14/2014 1708   LEUKOCYTESUR NEGATIVE 06/18/2015 1318   LEUKOCYTESUR 3+ 10/14/2014 1708   Sepsis Labs: @LABRCNTIP (procalcitonin:4,lacticidven:4) )No results found for this or any previous visit (from the past 240 hour(s)).    Radiological Exams on Admission: Dg Chest 2 View  02/29/2016  CLINICAL DATA:  Generalized body aches EXAM: CHEST  2 VIEW COMPARISON:  06/18/2015 FINDINGS: Cardiomediastinal silhouette is stable. Spinal wire stimulators mid thoracic spine again  noted. No acute infiltrate or pulmonary edema. Moderate size hiatal hernia again noted. Mild degenerative changes mid thoracic spine. IMPRESSION: No active cardiopulmonary disease. Electronically Signed   By: Natasha Mead M.D.   On: 02/29/2016 11:39    EKG: Independently reviewed.   Active Problems:   Back pain   Nausea and vomiting   Assessment/Plan 1. Nausea and Vomiting 2. Hypokalemia 3. History of depression 4. Chronic back pain   Admit patient for observation  Hydrate patient  Manage nausea and vomiting supportively  Urinalysis  Monitor and replete electrolytes  DVT prophylaxis: Lovenox Code Status: Full Family Communication:  Disposition Plan: Home   Consults called: None   Admission status: Observation    Time spent: 50 minutes  Berton Mount, MD  [facility] Pager #: [phone] 7PM-7AM contact night coverage as above   02/29/2016, 5:16 PM

## 2016-03-01 DIAGNOSIS — E869 Volume depletion, unspecified: Secondary | ICD-10-CM | POA: Diagnosis not present

## 2016-03-01 DIAGNOSIS — M545 Low back pain: Secondary | ICD-10-CM

## 2016-03-01 DIAGNOSIS — E876 Hypokalemia: Secondary | ICD-10-CM | POA: Diagnosis not present

## 2016-03-01 DIAGNOSIS — R112 Nausea with vomiting, unspecified: Secondary | ICD-10-CM | POA: Diagnosis not present

## 2016-03-01 LAB — BASIC METABOLIC PANEL
Anion gap: 11 (ref 5–15)
BUN: 25 mg/dL — ABNORMAL HIGH (ref 6–20)
CO2: 21 mmol/L — ABNORMAL LOW (ref 22–32)
Calcium: 9.7 mg/dL (ref 8.9–10.3)
Chloride: 105 mmol/L (ref 101–111)
Creatinine, Ser: 0.58 mg/dL (ref 0.44–1.00)
GFR calc Af Amer: >60 mL/min (ref >60)
GFR calc non Af Amer: >60 mL/min (ref >60)
Glucose, Bld: 116 mg/dL — ABNORMAL HIGH (ref 65–99)
Potassium: 4.2 mmol/L (ref 3.5–5.1)
Sodium: 137 mmol/L (ref 135–145)

## 2016-03-01 LAB — CBC
HCT: 42.9 % (ref 36.0–46.0)
Hemoglobin: 14.4 g/dL (ref 12.0–15.0)
MCH: 31.9 pg (ref 26.0–34.0)
MCHC: 33.6 g/dL (ref 30.0–36.0)
MCV: 95.1 fL (ref 78.0–100.0)
Platelets: 287 10*3/uL (ref 150–400)
RBC: 4.51 MIL/uL (ref 3.87–5.11)
RDW: 13.5 % (ref 11.5–15.5)
WBC: 5.4 10*3/uL (ref 4.0–10.5)

## 2016-03-01 LAB — URINALYSIS, ROUTINE W REFLEX MICROSCOPIC
Bilirubin Urine: NEGATIVE
Glucose, UA: NEGATIVE mg/dL
Hgb urine dipstick: NEGATIVE
Ketones, ur: NEGATIVE mg/dL
Leukocytes, UA: NEGATIVE
Nitrite: NEGATIVE
Protein, ur: NEGATIVE mg/dL
Specific Gravity, Urine: 1.023 (ref 1.005–1.030)
pH: 6.5 (ref 5.0–8.0)

## 2016-03-01 NOTE — Progress Notes (Signed)
Discharge instructions discussed with patient until no further questions ask. I clarified decadron order with Dr Francis Dowsegabata. Pt is not to take decadron at home. Pt understands AVS reflects one time dose given in ER and does not need further decadron.

## 2016-03-01 NOTE — Evaluation (Signed)
Physical Therapy Evaluation Patient Details Name: Gina DesanctisGeraldine L Lowe MRN: [id number] DOB: Feb 24, 1946 Today's Date: 03/01/2016   History of Present Illness   70 yo female  adm with decr K+  Clinical Impression  Patient evaluated by Physical Therapy with no further acute PT needs identified. All education has been completed and the patient has no further questions. See below for any follow-up Physical Therapy or equipment needs. PT is signing off. Thank you for this referral.     Follow Up Recommendations No PT follow up    Equipment Recommendations  None recommended by PT    Recommendations for Other Services       Precautions / Restrictions Restrictions Weight Bearing Restrictions: No      Mobility  Bed Mobility Overal bed mobility: Modified Independent                Transfers Overall transfer level: Needs assistance Equipment used: Rolling walker (2 wheeled) Transfers: Sit to/from Stand Sit to Stand: Supervision         General transfer comment: cues for hand placmenent, no physical assist needed  Ambulation/Gait Ambulation/Gait assistance: Supervision Ambulation Distance (Feet): 130 Feet Assistive device: Rolling walker (2 wheeled) Gait Pattern/deviations: Step-through pattern;Decreased stride length;Wide base of support     General Gait Details: occaisonal cues for RW position from self  Stairs            Wheelchair Mobility    Modified Rankin (Stroke Patients Only)       Balance Overall balance assessment: Needs assistance   Sitting balance-Leahy Scale: Good       Standing balance-Leahy Scale: Fair                               Pertinent Vitals/Pain Pain Assessment: No/denies pain    Home Living Family/patient expects to be discharged to:: Private residence Living Arrangements: Alone Available Help at Discharge: Family;Other (Comment);Available PRN/intermittently (3sones, one lives next door) Type of Home: Mobile  home Home Access: Ramped entrance     Home Layout: One level Home Equipment: Cane - single point;Wheelchair - [employer]manual;Walker - 2 wheels;Bedside commode;Shower seat      Prior Function Level of Independence: Independent with assistive device(s);Independent         Comments: uses cane or walker depending on the day     Hand Dominance        Extremity/Trunk Assessment   Upper Extremity Assessment: Overall WFL for tasks assessed           Lower Extremity Assessment: Overall WFL for tasks assessed         Communication   Communication: No difficulties  Cognition Arousal/Alertness: Awake/alert Behavior During Therapy: WFL for tasks assessed/performed Overall Cognitive Status: Within Functional Limits for tasks assessed                      General Comments      Exercises        Assessment/Plan    PT Assessment Patent does not need any further PT services  PT Diagnosis Difficulty walking   PT Problem List    PT Treatment Interventions     PT Goals (Current goals can be found in the Care Plan section) Acute Rehab PT Goals Patient Stated Goal: home today to see her dog PT Goal Formulation: All assessment and education complete, DC therapy    Frequency     Barriers to discharge  Co-evaluation               End of Session Equipment Utilized During Treatment: Gait belt Activity Tolerance: Patient tolerated treatment well Patient left: with call bell/phone within reach;in bed      Functional Assessment Tool Used: clinical judgement Functional Limitation: Mobility: Walking and moving around Mobility: Walking and Moving Around Current Status ([id number]): At least 1 percent but less than 20 percent impaired, limited or restricted Mobility: Walking and Moving Around Goal Status [phone]): At least 1 percent but less than 20 percent impaired, limited or restricted Mobility: Walking and Moving Around Discharge Status [phone]): At least 1  percent but less than 20 percent impaired, limited or restricted    Time: 1127-1151 PT Time Calculation (min) (ACUTE ONLY): 24 min   Charges:   PT Evaluation $PT Eval Low Complexity: 1 Procedure PT Treatments $Gait Training: 8-22 mins   PT G Codes:   PT G-Codes **NOT FOR INPATIENT CLASS** Functional Assessment Tool Used: clinical judgement Functional Limitation: Mobility: Walking and moving around Mobility: Walking and Moving Around Current Status ([id number]): At least 1 percent but less than 20 percent impaired, limited or restricted Mobility: Walking and Moving Around Goal Status [phone]): At least 1 percent but less than 20 percent impaired, limited or restricted Mobility: Walking and Moving Around Discharge Status [phone]): At least 1 percent but less than 20 percent impaired, limited or restricted    [facility] 03/01/2016, 11:51 AM

## 2016-03-01 NOTE — Discharge Summary (Signed)
Physician Discharge Summary  Patient ID: Gina Lowe MRN: [id number] DOB/AGE: Oct 22, 1945 70 y.o.  Admit date: 02/29/2016 Discharge date: 03/01/2016  Admission Diagnoses:  Discharge Diagnoses:  Active Problems:   Back pain   Nausea and vomiting Hypokalemia Chronic back pain. Volume depletion.  Discharged Condition: stable  Hospital Course: 70 year old Caucasian female with history of depression and chronic back pain, currently home alone, presents with nausea and vomiting. No associated headache, no neck pain, no chest pain, no fever or chills, no diarrhea and no urinary symptoms. On admission, the patient's potassium was 3.2. Patient was on potassium supplement prior to admission. Patient was admitted for further assessment and management. Patient was fluid resuscitated during the hospital stay. Nausea and vomiting were managed supportively and have improved. Electrolytes were monitored and replete as deemed appropriate. Potassium is 4.2 this morning. Patient will need to repeat BMP in PCP's office in 2-3 days. Patient is back to her normal self and will be discharged back to the care of the PCP.  Consults: None  Significant Diagnostic Studies: Potassium of 3.2 on presentation.  Discharge Medication - see Med. Rec.  Discharge Exam: Blood pressure 157/90, pulse 62, temperature 98.2 F (36.8 C), temperature source Oral, resp. rate 16, height  (1.549 m), weight 58.968 kg (130 lb), SpO2 99 %.  Disposition: 01-Home or Self Care  Discharge Instructions    Call MD for:    Complete by:  As directed   Call Physician if symptoms worsen     Diet - low sodium heart healthy    Complete by:  As directed      Discharge instructions    Complete by:  As directed   Follow up with PCP in one week. Check BMP in PCP's office in 2-3 days.     Increase activity slowly    Complete by:  As directed             Medication List    STOP taking these medications        acetaminophen  500 MG tablet  Commonly known as:  TYLENOL     amitriptyline 10 MG tablet  Commonly known as:  ELAVIL     gabapentin 800 MG tablet  Commonly known as:  NEURONTIN      TAKE these medications        amLODipine 10 MG tablet  Commonly known as:  NORVASC  Take 10 mg by mouth daily.     aspirin EC 81 MG tablet  Take 81 mg by mouth daily.     atorvastatin 20 MG tablet  Commonly known as:  LIPITOR  Take 20 mg by mouth daily.     dexamethasone 6 MG tablet  Commonly known as:  DECADRON  Take 2 tablets (12 mg total) by mouth once.     ferrous sulfate 325 (65 FE) MG tablet  Take 1 tablet (325 mg total) by mouth daily with breakfast.     KLOR-CON 10 10 MEQ tablet  Generic drug:  potassium chloride  Take 20 mEq by mouth daily.     ondansetron 8 MG disintegrating tablet  Commonly known as:  ZOFRAN ODT  Take 1 tablet (8 mg total) by mouth every 8 (eight) hours as needed for nausea or vomiting.     Oxycodone HCl 20 MG Tabs  Take 1 tablet (20 mg total) by mouth every 6 (six) hours as needed (pain).     pantoprazole 40 MG tablet  Commonly known as:  PROTONIX  Take 40 mg by mouth daily.     PARoxetine 30 MG tablet  Commonly known as:  PAXIL  Take 30 mg by mouth daily.     pregabalin 150 MG capsule  Commonly known as:  LYRICA  Take 150 mg by mouth 2 (two) times daily.     traZODone 100 MG tablet  Commonly known as:  DESYREL  Take 100 mg by mouth at bedtime.     VENTOLIN HFA 108 (90 Base) MCG/ACT inhaler  Generic drug:  albuterol  Inhale 2 puffs into the lungs every 6 (six) hours as needed. Wheezing and shortness of breath           Follow-up Information    Follow up with CONROY,NATHAN, PA-C.   Specialty:  Physician Assistant   Contact information:   [address] [place] [id number] [phone]       Follow up In 1 week.      SignedBarnetta Chapel: Sylvester I Ogbata 03/01/2016, 10:51 AM

## 2016-03-30 DIAGNOSIS — G4700 Insomnia, unspecified: Secondary | ICD-10-CM | POA: Diagnosis not present

## 2016-03-30 DIAGNOSIS — M545 Low back pain: Secondary | ICD-10-CM | POA: Diagnosis not present

## 2016-03-30 DIAGNOSIS — I10 Essential (primary) hypertension: Secondary | ICD-10-CM | POA: Diagnosis not present

## 2016-03-30 DIAGNOSIS — N390 Urinary tract infection, site not specified: Secondary | ICD-10-CM | POA: Diagnosis not present

## 2016-03-30 DIAGNOSIS — R110 Nausea: Secondary | ICD-10-CM | POA: Diagnosis not present

## 2016-03-30 DIAGNOSIS — E876 Hypokalemia: Secondary | ICD-10-CM | POA: Diagnosis not present

## 2016-03-30 DIAGNOSIS — Z9181 History of falling: Secondary | ICD-10-CM | POA: Diagnosis not present

## 2016-03-30 DIAGNOSIS — Z79899 Other long term (current) drug therapy: Secondary | ICD-10-CM | POA: Diagnosis not present

## 2016-05-17 DIAGNOSIS — Z79899 Other long term (current) drug therapy: Secondary | ICD-10-CM | POA: Diagnosis not present

## 2016-05-17 DIAGNOSIS — F1123 Opioid dependence with withdrawal: Secondary | ICD-10-CM | POA: Diagnosis not present

## 2016-05-17 DIAGNOSIS — I10 Essential (primary) hypertension: Secondary | ICD-10-CM | POA: Diagnosis not present

## 2016-05-17 DIAGNOSIS — M545 Low back pain: Secondary | ICD-10-CM | POA: Diagnosis not present

## 2016-06-07 ENCOUNTER — Encounter (HOSPITAL_COMMUNITY): Payer: Self-pay | Admitting: Emergency Medicine

## 2016-06-07 ENCOUNTER — Emergency Department (HOSPITAL_COMMUNITY)
Admission: EM | Admit: 2016-06-07 | Discharge: 2016-06-08 | Disposition: A | Discharge status: Home / Self Care | Payer: PPO | Attending: Emergency Medicine | Admitting: Emergency Medicine

## 2016-06-07 ENCOUNTER — Emergency Department (HOSPITAL_COMMUNITY): Payer: PPO

## 2016-06-07 DIAGNOSIS — R2689 Other abnormalities of gait and mobility: Secondary | ICD-10-CM | POA: Diagnosis not present

## 2016-06-07 DIAGNOSIS — Y999 Unspecified external cause status: Secondary | ICD-10-CM | POA: Insufficient documentation

## 2016-06-07 DIAGNOSIS — R296 Repeated falls: Secondary | ICD-10-CM

## 2016-06-07 DIAGNOSIS — R269 Unspecified abnormalities of gait and mobility: Secondary | ICD-10-CM

## 2016-06-07 DIAGNOSIS — Z79899 Other long term (current) drug therapy: Secondary | ICD-10-CM | POA: Diagnosis not present

## 2016-06-07 DIAGNOSIS — Y92009 Unspecified place in unspecified non-institutional (private) residence as the place of occurrence of the external cause: Secondary | ICD-10-CM | POA: Insufficient documentation

## 2016-06-07 DIAGNOSIS — Z96652 Presence of left artificial knee joint: Secondary | ICD-10-CM | POA: Insufficient documentation

## 2016-06-07 DIAGNOSIS — R4781 Slurred speech: Secondary | ICD-10-CM | POA: Diagnosis not present

## 2016-06-07 DIAGNOSIS — Z96651 Presence of right artificial knee joint: Secondary | ICD-10-CM | POA: Diagnosis not present

## 2016-06-07 DIAGNOSIS — Z7982 Long term (current) use of aspirin: Secondary | ICD-10-CM | POA: Insufficient documentation

## 2016-06-07 DIAGNOSIS — Y939 Activity, unspecified: Secondary | ICD-10-CM | POA: Diagnosis not present

## 2016-06-07 DIAGNOSIS — I10 Essential (primary) hypertension: Secondary | ICD-10-CM | POA: Diagnosis not present

## 2016-06-07 DIAGNOSIS — T148 Other injury of unspecified body region: Secondary | ICD-10-CM | POA: Diagnosis not present

## 2016-06-07 DIAGNOSIS — S51012A Laceration without foreign body of left elbow, initial encounter: Secondary | ICD-10-CM | POA: Insufficient documentation

## 2016-06-07 DIAGNOSIS — S59902A Unspecified injury of left elbow, initial encounter: Secondary | ICD-10-CM | POA: Diagnosis not present

## 2016-06-07 DIAGNOSIS — S0181XA Laceration without foreign body of other part of head, initial encounter: Secondary | ICD-10-CM | POA: Diagnosis not present

## 2016-06-07 DIAGNOSIS — S0101XA Laceration without foreign body of scalp, initial encounter: Secondary | ICD-10-CM | POA: Diagnosis not present

## 2016-06-07 DIAGNOSIS — S0083XA Contusion of other part of head, initial encounter: Secondary | ICD-10-CM | POA: Diagnosis not present

## 2016-06-07 DIAGNOSIS — W1830XA Fall on same level, unspecified, initial encounter: Secondary | ICD-10-CM | POA: Insufficient documentation

## 2016-06-07 DIAGNOSIS — R42 Dizziness and giddiness: Secondary | ICD-10-CM | POA: Diagnosis not present

## 2016-06-07 DIAGNOSIS — R531 Weakness: Secondary | ICD-10-CM | POA: Diagnosis not present

## 2016-06-07 LAB — COMPREHENSIVE METABOLIC PANEL
ALK PHOS: 62 U/L (ref 38–126)
ALT: 26 U/L (ref 14–54)
AST: 41 U/L (ref 15–41)
Albumin: 3.9 g/dL (ref 3.5–5.0)
Anion gap: 10 (ref 5–15)
BILIRUBIN TOTAL: 0.5 mg/dL (ref 0.3–1.2)
BUN: 15 mg/dL (ref 6–20)
CALCIUM: 9.1 mg/dL (ref 8.9–10.3)
CHLORIDE: 106 mmol/L (ref 101–111)
CO2: 21 mmol/L — AB (ref 22–32)
CREATININE: 0.89 mg/dL (ref 0.44–1.00)
GLUCOSE: 110 mg/dL — AB (ref 65–99)
Potassium: 3.8 mmol/L (ref 3.5–5.1)
Sodium: 137 mmol/L (ref 135–145)
Total Protein: 6.4 g/dL — ABNORMAL LOW (ref 6.5–8.1)

## 2016-06-07 LAB — URINALYSIS, ROUTINE W REFLEX MICROSCOPIC
BILIRUBIN URINE: NEGATIVE
GLUCOSE, UA: NEGATIVE mg/dL
Hgb urine dipstick: NEGATIVE
Ketones, ur: NEGATIVE mg/dL
Leukocytes, UA: NEGATIVE
NITRITE: NEGATIVE
PH: 6.5 (ref 5.0–8.0)
Protein, ur: NEGATIVE mg/dL
SPECIFIC GRAVITY, URINE: 1.018 (ref 1.005–1.030)

## 2016-06-07 LAB — CBC WITH DIFFERENTIAL/PLATELET
BASOS ABS: 0 10*3/uL (ref 0.0–0.1)
Basophils Relative: 0 %
EOS PCT: 1 %
Eosinophils Absolute: 0.1 10*3/uL (ref 0.0–0.7)
HCT: 32.3 % — ABNORMAL LOW (ref 36.0–46.0)
Hemoglobin: 10.1 g/dL — ABNORMAL LOW (ref 12.0–15.0)
LYMPHS ABS: 0.8 10*3/uL (ref 0.7–4.0)
LYMPHS PCT: 10 %
MCH: 30.4 pg (ref 26.0–34.0)
MCHC: 31.3 g/dL (ref 30.0–36.0)
MCV: 97.3 fL (ref 78.0–100.0)
MONO ABS: 0.9 10*3/uL (ref 0.1–1.0)
Monocytes Relative: 11 %
NEUTROS ABS: 6.2 10*3/uL (ref 1.7–7.7)
Neutrophils Relative %: 78 %
PLATELETS: 280 10*3/uL (ref 150–400)
RBC: 3.32 MIL/uL — AB (ref 3.87–5.11)
RDW: 15.5 % (ref 11.5–15.5)
WBC: 8 10*3/uL (ref 4.0–10.5)

## 2016-06-07 LAB — CBG MONITORING, ED: Glucose-Capillary: 103 mg/dL — ABNORMAL HIGH (ref 65–99)

## 2016-06-07 LAB — I-STAT TROPONIN, ED: Troponin i, poc: 0 ng/mL (ref 0.00–0.08)

## 2016-06-07 MED ORDER — LIDOCAINE-EPINEPHRINE (PF) 2 %-1:200000 IJ SOLN
10.0000 mL | Freq: Once | INTRAMUSCULAR | Status: AC
  Administered 2016-06-07: 10 mL via INTRADERMAL
  Filled 2016-06-07: qty 20

## 2016-06-07 NOTE — Care Management Note (Signed)
Case Management Note  Patient Details  Name: Gina Lowe MRN: [id number] Date of Birth: [birth year]  Subjective/Objective:       Patient presented to [facility] ED with recurrent falls in the past week Received  CM consult concerning home health needs.              Action/Plan: CM met with patient at bedside, she reports living alone at home, and recently has had several falls in the past weeks she states she experiencing generalized weakness. Discussed the recommendations for [facility] services to assist with reconditioning, patient is agreeable. Offered choice, patient selected [facility]. CM explained that referral will be faxed in in the am, patient verbalized understand, teach back done. Patient denies any further questions or concerns at this time.  CM provided patient with contact information should any further questions or concerns should arise. No further ED CM needs identified.  Expected Discharge Date:   06/07/2016               Expected Discharge Plan:  [place]  In-House Referral:     Discharge planning Services  CM Consult  Post Acute Care Choice:    Choice offered to:  Patient, Adult Children  DME Arranged:    DME Agency:     HH Arranged:  RN, PT, OT HH Agency:  [facility]  Status of Service:  Completed, signed off  If discussed at [place] of Stay Meetings, dates discussed:    Additional CommentsLaurena Slimmer, RN 06/07/2016, 11:07 PM

## 2016-06-07 NOTE — ED Provider Notes (Signed)
[facility]-EMERGENCY DEPT Provider Note   CSN: [id number] Arrival date & time: 06/07/16  2049     History   Chief Complaint Chief Complaint  Patient presents with  . Dizziness  . Fall    HPI Gina Lowe is a 70 y.o. female.  70 y.o. female presents with increased recurrent falls at home. Has h/o chronic back pain which is unchanged. Family is concerned because she has been speaking more rapidly on occasion and tends to stutter more when doing so.    The history is provided by the patient and a relative.  Fall  This is a recurrent problem. Episode onset: 3 days. The problem occurs daily (5 total falls). The problem has been gradually worsening. Pertinent negatives include no chest pain, no abdominal pain, no headaches and no shortness of breath. The symptoms are aggravated by walking. Nothing relieves the symptoms. She has tried nothing for the symptoms.    Past Medical History:  Diagnosis Date  . Anemia   . Arthritis    "knees" (01/14/2015)  . Carpal tunnel syndrome   . Chronic back pain   . Chronic lower back pain   . Chronic pain   . Chronic prescription opiate use   . Depression   . Diverticulosis   . Erosive esophagitis   . Fibromyalgia   . GERD (gastroesophageal reflux disease)   . High cholesterol   . History of bleeding peptic ulcer   . History of blood transfusion X 2   "related to when I passed out & when I was dehydrated"  . History of hiatal hernia   . Hypertension   . IBS (irritable bowel syndrome)   . Osteoarthritis   . Peripheral neuropathy (HCC)    "hands" (01/14/2015)  . Recurrent falls   . Syncope and collapse    "today is the 5th or 6th time in the last year" (01/14/2015)    Patient Active Problem List   Diagnosis Date Noted  . Back pain 02/29/2016  . Nausea and vomiting 02/29/2016  . Gait instability   . Midline low back pain without sciatica   . HLD (hyperlipidemia)   . Essential hypertension   . Aphasia 06/18/2015  . Altered mental  status   . Fall at home   . Faintness   . Acute kidney injury (HCC) 01/14/2015  . Syncope 01/14/2015  . Hypophosphatemia 02/09/2013  . Nausea & vomiting 02/08/2013  . Diarrhea 02/08/2013  . Hypertension 02/08/2013    Past Surgical History:  Procedure Laterality Date  . ABDOMINAL HYSTERECTOMY  1970's  . BACK SURGERY    . BLADDER SUSPENSION    . BUNIONECTOMY Left 1970's?  Marland Kitchen JOINT REPLACEMENT    . LAPAROSCOPIC CHOLECYSTECTOMY  1980's?  . LUMBAR FUSION  2007   Dr. Lovell Sheehan  . SPINAL CORD STIMULATOR IMPLANT  ~ 2014/2015  . TOTAL KNEE ARTHROPLASTY Left 2012  . TOTAL KNEE ARTHROPLASTY Right 08/2014  . TOTAL KNEE ARTHROPLASTY Left   . TUBAL LIGATION  1960's    OB History    No data available       Home Medications    Prior to Admission medications   Medication Sig Start Date End Date Taking? Authorizing Provider  albuterol (VENTOLIN HFA) 108 (90 Base) MCG/ACT inhaler Inhale 2 puffs into the lungs every 6 (six) hours as needed. Wheezing and shortness of breath 10/18/12   Historical Provider, MD  amLODipine (NORVASC) 10 MG tablet Take 10 mg by mouth daily.    Historical Provider, MD  aspirin EC 81 MG tablet Take 81 mg by mouth daily.    Historical Provider, MD  atorvastatin (LIPITOR) 20 MG tablet Take 20 mg by mouth daily. 02/17/16   Historical Provider, MD  dexamethasone (DECADRON) 6 MG tablet Take 2 tablets (12 mg total) by mouth once. 02/29/16   Raeford RazorStephen Kohut, MD  ferrous sulfate 325 (65 FE) MG tablet Take 1 tablet (325 mg total) by mouth daily with breakfast. 06/19/15   John GiovanniVasundhra Rathore, MD  KLOR-CON 10 10 MEQ tablet Take 20 mEq by mouth daily. 02/19/16   Historical Provider, MD  ondansetron (ZOFRAN ODT) 8 MG disintegrating tablet Take 1 tablet (8 mg total) by mouth every 8 (eight) hours as needed for nausea or vomiting. 01/17/16   Lorre NickAnthony Allen, MD  Oxycodone HCl 20 MG TABS Take 1 tablet (20 mg total) by mouth every 6 (six) hours as needed (pain). 06/19/15   John GiovanniVasundhra Rathore, MD    pantoprazole (PROTONIX) 40 MG tablet Take 40 mg by mouth daily. 02/06/16   Historical Provider, MD  PARoxetine (PAXIL) 30 MG tablet Take 30 mg by mouth daily.    Historical Provider, MD  pregabalin (LYRICA) 150 MG capsule Take 150 mg by mouth 2 (two) times daily.    Historical Provider, MD  traZODone (DESYREL) 100 MG tablet Take 100 mg by mouth at bedtime. 01/28/16   Historical Provider, MD    Family History Family History  Problem Relation Age of Onset  . Hypertension Mother   . Stroke Mother   . Heart disease Father     Social History Social History  Substance Use Topics  . Smoking status: Never Smoker  . Smokeless tobacco: Never Used  . Alcohol use No     Allergies   Review of patient's allergies indicates no known allergies.   Review of Systems Review of Systems  Respiratory: Negative for shortness of breath.   Cardiovascular: Negative for chest pain.  Gastrointestinal: Negative for abdominal pain.  Neurological: Negative for headaches.  All other systems reviewed and are negative.    Physical Exam Updated Vital Signs BP 141/84   Pulse 84   Temp 98.6 F (37 C) (Oral)   Resp 19   Ht 4\' 11"  (1.499 m)   Wt 135 lb (61.2 kg)   SpO2 96%   BMI 27.27 kg/m   Physical Exam  Constitutional: She is oriented to person, place, and time. She appears well-developed and well-nourished. No distress.  HENT:  Head: Normocephalic. Head is with contusion and with laceration (punctate posterior scalp, hemostatic). Head is without raccoon's eyes and without Battle's sign.    Eyes: Conjunctivae are normal.  Neck: Neck supple. No tracheal deviation present.  Cardiovascular: Normal rate and regular rhythm.   Pulmonary/Chest: Effort normal. No respiratory distress.  Abdominal: Soft. She exhibits no distension.  Musculoskeletal:       Left forearm: She exhibits laceration (2 cm with overhanging flap and small associated skin tear).  Neurological: She is alert and oriented to  person, place, and time. She has normal strength. No cranial nerve deficit. Coordination normal. GCS eye subscore is 4. GCS verbal subscore is 5. GCS motor subscore is 6.  Decreased ability to extend right arm, patient states she has shoulder arthritis. No pronator drift. Normal heel-to-shin and rapid alternating movements. Negative head impulse testing, no nystagmus, normal cover/uncover testing  Skin: Skin is warm and dry.  Psychiatric: She has a normal mood and affect. Her speech is normal.     ED Treatments /  Results  Labs (all labs ordered are listed, but only abnormal results are displayed) Labs Reviewed  CBC WITH DIFFERENTIAL/PLATELET - Abnormal; Notable for the following:       Result Value   RBC 3.32 (*)    Hemoglobin 10.1 (*)    HCT 32.3 (*)    All other components within normal limits  COMPREHENSIVE METABOLIC PANEL - Abnormal; Notable for the following:    CO2 21 (*)    Glucose, Bld 110 (*)    Total Protein 6.4 (*)    All other components within normal limits  CBG MONITORING, ED - Abnormal; Notable for the following:    Glucose-Capillary 103 (*)    All other components within normal limits  URINALYSIS, ROUTINE W REFLEX MICROSCOPIC (NOT AT [facility])  I-STAT TROPOININ, ED    EKG  EKG Interpretation  Date/Time:  Monday June 07 2016 20:58:40 EDT Ventricular Rate:  86 PR Interval:    QRS Duration: 95 QT Interval:  358 QTC Calculation: 429 R Axis:   2 Text Interpretation:  Sinus rhythm No significant change since last tracing Confirmed by Braylea Brancato MD, Jacari Kirsten [phone]) on 06/07/2016 9:07:13 PM       Radiology Dg Elbow Complete Left  Result Date: 06/07/2016 CLINICAL DATA:  Status post fall EXAM: LEFT ELBOW - COMPLETE 3+ VIEW COMPARISON:  None. FINDINGS: There is osteophyte formation at the radial head and at the medial aspect of the ulnar head. Mild cortical irregularity of the proximal radius is favored to be chronic/degenerative. No fracture or dislocation. No elbow  effusion. IMPRESSION: Moderate elbow osteoarthrosis.  No acute fracture or dislocation. Electronically Signed   By: Deatra Robinson M.D.   On: 06/07/2016 22:42   Ct Head Wo Contrast  Result Date: 06/07/2016 CLINICAL DATA:  Patient fell and hit head against dresser today. Bleeding to the back of the head. Speech difficulty, dizziness, and recurrent falls. EXAM: CT HEAD WITHOUT CONTRAST TECHNIQUE: Contiguous axial images were obtained from the base of the skull through the vertex without intravenous contrast. COMPARISON:  06/18/2015 FINDINGS: Brain: Diffuse cerebral atrophy. Mild ventricular dilatation consistent with central atrophy. Low-attenuation changes in the deep white matter consistent with small vessel ischemia. 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 No mass effect or midline shift. No abnormal extra-axial fluid collections. Gray-white matter junctions are distinct. Basal cisterns are not effaced. No evidence of acute intracranial hemorrhage. Vascular: Vascular calcifications consistent with atherosclerosis. Skull: Normal. Negative for fracture or focal lesion. Sinuses/Orbits: Opacification of a few ethmoid air cells. Paranasal sinuses and mastoid air cells are otherwise clear. Other: Subcutaneous scalp hematoma over the right anterior frontal region. Probable degenerative changes in the left TMJ. IMPRESSION: No acute intracranial abnormalities. Chronic atrophy and small vessel ischemic changes. Electronically Signed   By: Burman Nieves M.D.   On: 06/07/2016 22:25    Procedures Procedures (including critical care time)  LACERATION REPAIR Performed by: Lyndal Pulley Authorized by: Lyndal Pulley Consent: Verbal consent obtained. Risks and benefits: risks, benefits and alternatives were discussed Consent given by: patient Patient identity confirmed: provided demographic data Prepped and Draped in normal sterile fashion Wound explored  Laceration Location: left elbow  Laceration  Length: 3 cm  No Foreign Bodies seen or palpated  Anesthesia: local infiltration  Local anesthetic: lidocaine 2% w epinephrine  Anesthetic total: 8 ml  Irrigation method: syringe Amount of cleaning: standard  Skin closure: 4-0 prolene  Number of sutures: 5  Technique: simple interrupted with dermabond applied to small adjacent skin tear  Patient tolerance: Patient tolerated the procedure well with no immediate complications.   Medications Ordered in ED Medications  lidocaine-EPINEPHrine (XYLOCAINE W/EPI) 2 %-1:200000 (PF) injection 10 mL (10 mLs Intradermal Given 06/07/16 2224)     Initial Impression / Assessment and Plan / ED Course  I have reviewed the triage vital signs and the nursing notes.  Pertinent labs & imaging results that were available during my care of the patient were reviewed by me and considered in my medical decision making (see chart for details).  Clinical Course    70 y.o. female presents with increasing falls at home with difficulty in gait. Pt has been found after falls by her son 5 times after falls refusing to use her life alert device. No loss of consciousness with episodes. Pt states she has tripped and loses balance especially while moving backward. CT head to evaluate for bleeding or ischemia is negative for acute findings despite a week of ongoing symptoms. Rest of workup is otherwise unremarkable. Pt has son to help her home, discussed slight possibility of missed ischemic stroke on CT and Pt declined MRI for definitive evaluation which I feel is appropriate at this time. Recommended outpatient neurologic evaluation for gait difficulty and case management consulted td/t high risk of falls to establish home health tomorrow and provide any equipment needs. Plan to follow up with PCP as needed and return precautions discussed for worsening or new concerning symptoms. Laceration was irrigated, repaired primarily with good approximation as documented in  procedure portion of note. No evidence of foreign body or non-viable tissue involvement in approximation. Pt counseled on proper management of closed wound and will return for suture removal.   Final Clinical Impressions(s) / ED Diagnoses   Final diagnoses:  Gait difficulty  Multiple falls  Elbow laceration, left, initial encounter  Forehead contusion, initial encounter  Scalp laceration, initial encounter    New Prescriptions New Prescriptions   No medications on file     Lyndal Pulley, MD 06/08/16 [phone]

## 2016-06-07 NOTE — ED Notes (Signed)
Wanda CM at bedside

## 2016-06-07 NOTE — ED Notes (Signed)
Skin tear to L elbow, unsure of last tetanus shot

## 2016-06-07 NOTE — Discharge Instructions (Signed)
Please obtain the first available neurology appointment to follow up and discuss your speech and walking trouble.

## 2016-06-07 NOTE — ED Triage Notes (Signed)
Pt in EMS from home, has had 5 falls since Saturday. 2 falls today, hematoma to R forehead, small lac to back of L head. States some falls r/t dizziness, some r/t mechanical fall. Pt has walker/wheelchair at home. A/OX4. C/O lower back pain 9/10.

## 2016-06-09 DIAGNOSIS — I10 Essential (primary) hypertension: Secondary | ICD-10-CM | POA: Diagnosis not present

## 2016-06-09 DIAGNOSIS — I951 Orthostatic hypotension: Secondary | ICD-10-CM | POA: Diagnosis not present

## 2016-06-09 DIAGNOSIS — R296 Repeated falls: Secondary | ICD-10-CM | POA: Diagnosis not present

## 2016-06-09 DIAGNOSIS — Z79899 Other long term (current) drug therapy: Secondary | ICD-10-CM | POA: Diagnosis not present

## 2016-06-09 DIAGNOSIS — G629 Polyneuropathy, unspecified: Secondary | ICD-10-CM | POA: Diagnosis not present

## 2016-06-09 DIAGNOSIS — S51812A Laceration without foreign body of left forearm, initial encounter: Secondary | ICD-10-CM | POA: Diagnosis not present

## 2016-06-17 DIAGNOSIS — D509 Iron deficiency anemia, unspecified: Secondary | ICD-10-CM | POA: Diagnosis not present

## 2016-06-17 DIAGNOSIS — F329 Major depressive disorder, single episode, unspecified: Secondary | ICD-10-CM | POA: Diagnosis not present

## 2016-06-17 DIAGNOSIS — I10 Essential (primary) hypertension: Secondary | ICD-10-CM | POA: Diagnosis not present

## 2016-06-17 DIAGNOSIS — R413 Other amnesia: Secondary | ICD-10-CM | POA: Diagnosis not present

## 2016-06-17 DIAGNOSIS — Z23 Encounter for immunization: Secondary | ICD-10-CM | POA: Diagnosis not present

## 2016-06-17 DIAGNOSIS — M545 Low back pain: Secondary | ICD-10-CM | POA: Diagnosis not present

## 2016-07-27 DIAGNOSIS — E669 Obesity, unspecified: Secondary | ICD-10-CM | POA: Diagnosis not present

## 2016-07-27 DIAGNOSIS — M545 Low back pain: Secondary | ICD-10-CM | POA: Diagnosis not present

## 2016-07-27 DIAGNOSIS — Z6830 Body mass index (BMI) 30.0-30.9, adult: Secondary | ICD-10-CM | POA: Diagnosis not present

## 2016-08-26 DIAGNOSIS — F329 Major depressive disorder, single episode, unspecified: Secondary | ICD-10-CM | POA: Diagnosis not present

## 2016-08-26 DIAGNOSIS — I10 Essential (primary) hypertension: Secondary | ICD-10-CM | POA: Diagnosis not present

## 2016-08-26 DIAGNOSIS — G4700 Insomnia, unspecified: Secondary | ICD-10-CM | POA: Diagnosis not present

## 2016-08-26 DIAGNOSIS — M48061 Spinal stenosis, lumbar region without neurogenic claudication: Secondary | ICD-10-CM | POA: Diagnosis not present

## 2016-08-26 DIAGNOSIS — Z1389 Encounter for screening for other disorder: Secondary | ICD-10-CM | POA: Diagnosis not present

## 2016-10-05 DIAGNOSIS — M797 Fibromyalgia: Secondary | ICD-10-CM | POA: Diagnosis not present

## 2016-10-05 DIAGNOSIS — M545 Low back pain: Secondary | ICD-10-CM | POA: Diagnosis not present

## 2016-10-05 DIAGNOSIS — Z6834 Body mass index (BMI) 34.0-34.9, adult: Secondary | ICD-10-CM | POA: Diagnosis not present

## 2016-12-13 DIAGNOSIS — Z79899 Other long term (current) drug therapy: Secondary | ICD-10-CM | POA: Diagnosis not present

## 2016-12-13 DIAGNOSIS — Z7689 Persons encountering health services in other specified circumstances: Secondary | ICD-10-CM | POA: Diagnosis not present

## 2017-01-14 DIAGNOSIS — D509 Iron deficiency anemia, unspecified: Secondary | ICD-10-CM | POA: Diagnosis not present

## 2017-01-14 DIAGNOSIS — F329 Major depressive disorder, single episode, unspecified: Secondary | ICD-10-CM | POA: Diagnosis not present

## 2017-01-14 DIAGNOSIS — M797 Fibromyalgia: Secondary | ICD-10-CM | POA: Diagnosis not present

## 2017-01-14 DIAGNOSIS — I10 Essential (primary) hypertension: Secondary | ICD-10-CM | POA: Diagnosis not present

## 2017-01-14 DIAGNOSIS — M545 Low back pain: Secondary | ICD-10-CM | POA: Diagnosis not present

## 2017-01-27 DIAGNOSIS — R195 Other fecal abnormalities: Secondary | ICD-10-CM | POA: Diagnosis not present

## 2017-02-09 DIAGNOSIS — M545 Low back pain: Secondary | ICD-10-CM | POA: Diagnosis not present

## 2017-02-09 DIAGNOSIS — R413 Other amnesia: Secondary | ICD-10-CM | POA: Diagnosis not present

## 2017-02-09 DIAGNOSIS — I10 Essential (primary) hypertension: Secondary | ICD-10-CM | POA: Diagnosis not present

## 2017-02-09 DIAGNOSIS — D509 Iron deficiency anemia, unspecified: Secondary | ICD-10-CM | POA: Diagnosis not present

## 2017-02-09 DIAGNOSIS — Z6836 Body mass index (BMI) 36.0-36.9, adult: Secondary | ICD-10-CM | POA: Diagnosis not present

## 2017-02-18 DIAGNOSIS — Z6836 Body mass index (BMI) 36.0-36.9, adult: Secondary | ICD-10-CM | POA: Diagnosis not present

## 2017-02-18 DIAGNOSIS — M545 Low back pain: Secondary | ICD-10-CM | POA: Diagnosis not present

## 2017-02-18 DIAGNOSIS — I10 Essential (primary) hypertension: Secondary | ICD-10-CM | POA: Diagnosis not present

## 2017-02-18 DIAGNOSIS — D509 Iron deficiency anemia, unspecified: Secondary | ICD-10-CM | POA: Diagnosis not present

## 2017-02-22 NOTE — Progress Notes (Signed)
[facility] day:  02/23/2017  Chief Complaint: Gina Lowe is a 71 y.o. female with anemia who is referred by Cyndi Bender, PA in consultation for assessment and management.  HPI: The patient states that she has been taking iron for "a long time".  "Blood levels" have progressively gone down in the past 6 months. She denies any melena, hematochezia, hematuria, or vaginal bleeding. Stools are sometimes dark.  She has had guaiac + stools.  She had a colonoscopy about 8 years ago at [facility] following diverticulitis.  She has an appointment with gastroenterology with [facility] in [place].    Regarding her diet, she eats 4 loves of bread a week. She eats meat 1-2 times a day. She mostly eats bacon and chicken. She eats green leafy vegetables. She has cravings for sweets. She has had ice pica for 3 weeks.  Labs are as follows:  CBC on 01/14/2017 revealed a hematocrit of 25.9, hemoglobin 8.0, MCV 81.  Ferritin was 11.  CBC on 02/09/2017 revealed a hematocrit of 26.1, hemoglobin 7.9, MCV 81.  Ferritin was 10.  CBC on 02/18/2017 revealed a hematocrit of 25.0, hemoglobin 7.8, MCV 79, platelets 405,000, and WBC 5700 with an ANC of 3900.  Ferritin was 11.  Symptomatically, she is tired a lot. She has a little shortness of breath. She states that she can't exert herself. She can only walk from one room to another. She is able to perform all of her activities of daily living.             Past Medical History:  Diagnosis Date  . Anemia   . Arthritis    "knees" (01/14/2015)  . Carpal tunnel syndrome   . Chronic back pain   . Chronic lower back pain   . Chronic pain   . Chronic prescription opiate use   . Depression   . Diverticulosis   . Erosive esophagitis   . Fibromyalgia   . GERD (gastroesophageal reflux disease)   . High cholesterol   . History of bleeding peptic ulcer   . History of blood transfusion X 2   "related to when I passed out & when I was  dehydrated"  . History of hiatal hernia   . Hypertension   . IBS (irritable bowel syndrome)   . Osteoarthritis   . Peripheral neuropathy ([place])    "hands" (01/14/2015)  . Recurrent falls   . Syncope and collapse    "today is the 5th or 6th time in the last year" (01/14/2015)    Past Surgical History:  Procedure Laterality Date  . ABDOMINAL HYSTERECTOMY  1970's  . BACK SURGERY    . BLADDER SUSPENSION    . BUNIONECTOMY Left 1970's?  Marland Kitchen JOINT REPLACEMENT    . LAPAROSCOPIC CHOLECYSTECTOMY  1980's?  . LUMBAR FUSION  2007   Dr. Arnoldo Morale  . SPINAL CORD STIMULATOR IMPLANT  ~ 2014/2015  . TOTAL KNEE ARTHROPLASTY Left 2012  . TOTAL KNEE ARTHROPLASTY Right 08/2014  . TOTAL KNEE ARTHROPLASTY Left   . TUBAL LIGATION  1960's    Family History  Problem Relation Age of Onset  . Hypertension Mother   . Stroke Mother   . Heart disease Father     Social History:  reports that she has never smoked. She has never used smokeless tobacco. She reports that she does not drink alcohol or use drugs.  She denies any exposure to radiation or toxins. She is Lexicographer.  She  worked in a [employer]iological [employer].  She also did secretarial work.  She lives alone in [place].  The patient is accompanied by Frederich Cha, daughter-in-law, today.  Allergies: No Known Allergies  Current Medications: Current Outpatient Prescriptions  Medication Sig Dispense Refill  . acetaminophen (TYLENOL) 500 MG tablet Take 500 mg by mouth every 6 (six) hours as needed.    . calcium carbonate (OSCAL) 1500 (600 Ca) MG TABS tablet Take 1 tablet by mouth daily.    . ferrous sulfate 325 (65 FE) MG tablet Take 1 tablet (325 mg total) by mouth daily with breakfast. 30 tablet 3  . KLOR-CON 10 10 MEQ tablet Take 20 mEq by mouth daily.  5  . Oxycodone HCl 20 MG TABS Take 1 tablet (20 mg total) by mouth every 6 (six) hours as needed (pain).  0  . pantoprazole (PROTONIX) 40 MG tablet Take 40 mg by mouth daily.  11  . PARoxetine (PAXIL) 30 MG tablet Take  30 mg by mouth daily.    Marland Kitchen albuterol (VENTOLIN HFA) 108 (90 Base) MCG/ACT inhaler Inhale 2 puffs into the lungs every 6 (six) hours as needed. Wheezing and shortness of breath    . amLODipine (NORVASC) 10 MG tablet Take 10 mg by mouth daily.    Marland Kitchen aspirin EC 81 MG tablet Take 81 mg by mouth daily.    Marland Kitchen atorvastatin (LIPITOR) 20 MG tablet Take 20 mg by mouth daily.  1  . dexamethasone (DECADRON) 6 MG tablet Take 2 tablets (12 mg total) by mouth once. (Patient not taking: Reported on 02/23/2017) 2 tablet 0  . gabapentin (NEURONTIN) 800 MG tablet Take 800 mg by mouth 3 (three) times daily.  5  . losartan-hydrochlorothiazide (HYZAAR) 100-25 MG tablet losartan 100 mg-hydrochlorothiazide 25 mg tablet  TAKE 1 TABLET BY MOUTH EVERY DAY FOR BLOOD PRESSURE    . mirtazapine (REMERON) 45 MG tablet Take 45 mg by mouth daily.  5  . ondansetron (ZOFRAN ODT) 8 MG disintegrating tablet Take 1 tablet (8 mg total) by mouth every 8 (eight) hours as needed for nausea or vomiting. (Patient not taking: Reported on 02/23/2017) 20 tablet 0  . pregabalin (LYRICA) 150 MG capsule Take 150 mg by mouth 2 (two) times daily.    . traZODone (DESYREL) 100 MG tablet Take 100 mg by mouth at bedtime.  0   No current facility-administered medications for this visit.     Review of Systems:  GENERAL:  Fatigue.  No fevers, sweats or weight loss. PERFORMANCE STATUS (ECOG):  1 HEENT:  No visual changes, runny nose, sore throat, mouth sores or tenderness. Lungs:  Shortness of breath with exertion.  No cough.  No hemoptysis. Cardiac:  No chest pain, palpitations, orthopnea, or PND. GI:  No nausea, vomiting, diarrhea, constipation, or hematochezia.  Guaiac + stool.  Stool sometimes dark. GU:  No urgency, frequency, dysuria, or hematuria. Musculoskeletal: Back pain.  No joint pain.  No muscle tenderness. Extremities:  No pain or swelling. Skin:  No rashes or skin changes. Neuro:  Memory poor.  Headache.  No headache, numbness or weakness,  balance or coordination issues. Endocrine:  No diabetes, thyroid issues, hot flashes or night sweats. Psych:  No mood changes, depression or anxiety. Pain:  Back pain. Review of systems:  All other systems reviewed and found to be negative.  Physical Exam: Blood pressure 129/81, pulse 72, temperature 97.6 F (36.4 C), temperature source Tympanic, resp. rate 18, height '5\' 2"'  (1.575 m), weight 171 lb 1.2  oz (77.6 kg). GENERAL:  Well developed, well nourished, woman sitting comfortably in the exam room in no acute distress. MENTAL STATUS:  Alert and oriented to person, place and time. HEAD: Styled brown hair.  Normocephalic, atraumatic, face symmetric, no Cushingoid features. EYES:  Blue eyes.  Pupils equal round and reactive to light and accomodation.  No conjunctivitis or scleral icterus. ENT:  Oropharynx clear without lesion.  Tongue normal. Mucous membranes moist.  RESPIRATORY:  Clear to auscultation without rales, wheezes or rhonchi. CARDIOVASCULAR:  Regular rate and rhythm without murmur, rub or gallop. ABDOMEN:  Soft, non-tender, with active bowel sounds, and no hepatosplenomegaly.  No masses. SKIN:  No rashes, ulcers or lesions. EXTREMITIES: No edema, no skin discoloration or tenderness.  No palpable cords. LYMPH NODES: No palpable cervical, supraclavicular, axillary or inguinal adenopathy  NEUROLOGICAL: Unremarkable. PSYCH:  Appropriate.   No visits with results within 3 Day(s) from this visit.  Latest known visit with results is:  Admission on 06/07/2016, Discharged on 06/08/2016  Component Date Value Ref Range Status  . Glucose-Capillary 06/07/2016 103* 65 - 99 mg/dL Final  . WBC 06/07/2016 8.0  4.0 - 10.5 K/uL Final  . RBC 06/07/2016 3.32* 3.87 - 5.11 MIL/uL Final  . Hemoglobin 06/07/2016 10.1* 12.0 - 15.0 g/dL Final  . HCT 06/07/2016 32.3* 36.0 - 46.0 % Final  . MCV 06/07/2016 97.3  78.0 - 100.0 fL Final  . MCH 06/07/2016 30.4  26.0 - 34.0 pg Final  . MCHC 06/07/2016 31.3   30.0 - 36.0 g/dL Final  . RDW 06/07/2016 15.5  11.5 - 15.5 % Final  . Platelets 06/07/2016 280  150 - 400 K/uL Final  . Neutrophils Relative % 06/07/2016 78  % Final  . Neutro Abs 06/07/2016 6.2  1.7 - 7.7 K/uL Final  . Lymphocytes Relative 06/07/2016 10  % Final  . Lymphs Abs 06/07/2016 0.8  0.7 - 4.0 K/uL Final  . Monocytes Relative 06/07/2016 11  % Final  . Monocytes Absolute 06/07/2016 0.9  0.1 - 1.0 K/uL Final  . Eosinophils Relative 06/07/2016 1  % Final  . Eosinophils Absolute 06/07/2016 0.1  0.0 - 0.7 K/uL Final  . Basophils Relative 06/07/2016 0  % Final  . Basophils Absolute 06/07/2016 0.0  0.0 - 0.1 K/uL Final  . Sodium 06/07/2016 137  135 - 145 mmol/L Final  . Potassium 06/07/2016 3.8  3.5 - 5.1 mmol/L Final  . Chloride 06/07/2016 106  101 - 111 mmol/L Final  . CO2 06/07/2016 21* 22 - 32 mmol/L Final  . Glucose, Bld 06/07/2016 110* 65 - 99 mg/dL Final  . BUN 06/07/2016 15  6 - 20 mg/dL Final  . Creatinine, Ser 06/07/2016 0.89  0.44 - 1.00 mg/dL Final  . Calcium 06/07/2016 9.1  8.9 - 10.3 mg/dL Final  . Total Protein 06/07/2016 6.4* 6.5 - 8.1 g/dL Final  . Albumin 06/07/2016 3.9  3.5 - 5.0 g/dL Final  . AST 06/07/2016 41  15 - 41 U/L Final  . ALT 06/07/2016 26  14 - 54 U/L Final  . Alkaline Phosphatase 06/07/2016 62  38 - 126 U/L Final  . Total Bilirubin 06/07/2016 0.5  0.3 - 1.2 mg/dL Final  . GFR calc non Af Amer 06/07/2016 >60  >60 mL/min Final  . GFR calc Af Amer 06/07/2016 >60  >60 mL/min Final   Comment: (NOTE) The eGFR has been calculated using the CKD EPI equation. This calculation has not been validated in all clinical situations. eGFR's persistently <60  mL/min signify possible Chronic Kidney Disease.   . Anion gap 06/07/2016 10  5 - 15 Final  . Color, Urine 06/07/2016 YELLOW  YELLOW Final  . APPearance 06/07/2016 CLEAR  CLEAR Final  . Specific Gravity, Urine 06/07/2016 1.018  1.005 - 1.030 Final  . pH 06/07/2016 6.5  5.0 - 8.0 Final  . Glucose, UA  06/07/2016 NEGATIVE  NEGATIVE mg/dL Final  . Hgb urine dipstick 06/07/2016 NEGATIVE  NEGATIVE Final  . Bilirubin Urine 06/07/2016 NEGATIVE  NEGATIVE Final  . Ketones, ur 06/07/2016 NEGATIVE  NEGATIVE mg/dL Final  . Protein, ur 06/07/2016 NEGATIVE  NEGATIVE mg/dL Final  . Nitrite 06/07/2016 NEGATIVE  NEGATIVE Final  . Leukocytes, UA 06/07/2016 NEGATIVE  NEGATIVE Final   MICROSCOPIC NOT DONE ON URINES WITH NEGATIVE PROTEIN, BLOOD, LEUKOCYTES, NITRITE, OR GLUCOSE <1000 mg/dL.  . Troponin i, poc 06/07/2016 0.00  0.00 - 0.08 ng/mL Final  . Comment 3 06/07/2016          Final   Comment: Due to the release kinetics of cTnI, a negative result within the first hours of the onset of symptoms does not rule out myocardial infarction with certainty. If myocardial infarction is still suspected, repeat the test at appropriate intervals.     Assessment:  Gina Lowe is a 71 y.o. female with progressive iron deficiency anemia despite oral iron for the past 6 months.  Diet appears good.  She denies any hematochezia, melena, hematuria or vaginal bleeding.  Urinalysis on 06/07/2016 revealed no hematuria.  She has had guaiac + stool.  Last colonoscopy was 8 years ago.  She has had ice pica x 3 weeks.  CBC on 02/18/2017 revealed a hematocrit of 25.0, hemoglobin 7.8, MCV 79, platelets 405,000, and WBC 5700 with an ANC of 3900.  Ferritin was 11.  Symptomatically, she is fatigued.  She has shortness of breath with exertion.  Exam is unremarkable.  Plan: 1.  Review outside labs indicate iron deficiency anemia despite oral iron and a good diet.  Discuss plan to try IV iron.  Side effects reviewed.  Information provided.  Discuss need to follow-up with GI for endoscopies to investigate source of bleeding. 2.  Labs today:  CBC with diff, ferritin, iron studies, hold tube. 3.  Venofer today. 4.  RTC weekly x 3 for venofer 5.  RTC in 6 weeks for MD assessment and labs (CBC with diff, ferritin-day  before).   Lequita Asal, MD  02/23/2017, 9:23 AM

## 2017-02-23 ENCOUNTER — Inpatient Hospital Stay: Payer: PPO

## 2017-02-23 ENCOUNTER — Encounter: Payer: Self-pay | Admitting: Hematology and Oncology

## 2017-02-23 ENCOUNTER — Other Ambulatory Visit: Payer: Self-pay | Admitting: Hematology and Oncology

## 2017-02-23 ENCOUNTER — Inpatient Hospital Stay: Payer: PPO | Attending: Hematology and Oncology | Admitting: Hematology and Oncology

## 2017-02-23 VITALS — BP 157/91 | HR 73 | Resp 18

## 2017-02-23 DIAGNOSIS — F5089 Other specified eating disorder: Secondary | ICD-10-CM | POA: Insufficient documentation

## 2017-02-23 DIAGNOSIS — Z7982 Long term (current) use of aspirin: Secondary | ICD-10-CM | POA: Insufficient documentation

## 2017-02-23 DIAGNOSIS — M797 Fibromyalgia: Secondary | ICD-10-CM | POA: Insufficient documentation

## 2017-02-23 DIAGNOSIS — D509 Iron deficiency anemia, unspecified: Secondary | ICD-10-CM | POA: Diagnosis not present

## 2017-02-23 DIAGNOSIS — K219 Gastro-esophageal reflux disease without esophagitis: Secondary | ICD-10-CM | POA: Diagnosis not present

## 2017-02-23 DIAGNOSIS — K449 Diaphragmatic hernia without obstruction or gangrene: Secondary | ICD-10-CM | POA: Diagnosis not present

## 2017-02-23 DIAGNOSIS — R0602 Shortness of breath: Secondary | ICD-10-CM | POA: Diagnosis not present

## 2017-02-23 DIAGNOSIS — K589 Irritable bowel syndrome without diarrhea: Secondary | ICD-10-CM | POA: Diagnosis not present

## 2017-02-23 DIAGNOSIS — E7800 Pure hypercholesterolemia, unspecified: Secondary | ICD-10-CM | POA: Insufficient documentation

## 2017-02-23 DIAGNOSIS — G5600 Carpal tunnel syndrome, unspecified upper limb: Secondary | ICD-10-CM | POA: Insufficient documentation

## 2017-02-23 DIAGNOSIS — I10 Essential (primary) hypertension: Secondary | ICD-10-CM | POA: Insufficient documentation

## 2017-02-23 DIAGNOSIS — G629 Polyneuropathy, unspecified: Secondary | ICD-10-CM | POA: Diagnosis not present

## 2017-02-23 DIAGNOSIS — Z79899 Other long term (current) drug therapy: Secondary | ICD-10-CM

## 2017-02-23 DIAGNOSIS — F329 Major depressive disorder, single episode, unspecified: Secondary | ICD-10-CM | POA: Insufficient documentation

## 2017-02-23 DIAGNOSIS — M1990 Unspecified osteoarthritis, unspecified site: Secondary | ICD-10-CM | POA: Insufficient documentation

## 2017-02-23 LAB — SAMPLE TO BLOOD BANK

## 2017-02-23 LAB — FERRITIN: Ferritin: 35 ng/mL (ref 11–307)

## 2017-02-23 LAB — IRON AND TIBC
Iron: 29 ug/dL (ref 28–170)
Saturation Ratios: 6 % — ABNORMAL LOW (ref 10.4–31.8)
TIBC: 493 ug/dL — ABNORMAL HIGH (ref 250–450)
UIBC: 464 ug/dL

## 2017-02-23 LAB — CBC WITH DIFFERENTIAL/PLATELET
Basophils Absolute: 0 10*3/uL (ref 0–0.1)
Basophils Relative: 1 %
Eosinophils Absolute: 0.1 10*3/uL (ref 0–0.7)
Eosinophils Relative: 2 %
HCT: 27.3 % — ABNORMAL LOW (ref 35.0–47.0)
Hemoglobin: 8.4 g/dL — ABNORMAL LOW (ref 12.0–16.0)
Lymphocytes Relative: 9 %
Lymphs Abs: 0.6 10*3/uL — ABNORMAL LOW (ref 1.0–3.6)
MCH: 24.6 pg — ABNORMAL LOW (ref 26.0–34.0)
MCHC: 30.8 g/dL — ABNORMAL LOW (ref 32.0–36.0)
MCV: 79.9 fL — ABNORMAL LOW (ref 80.0–100.0)
Monocytes Absolute: 0.4 10*3/uL (ref 0.2–0.9)
Monocytes Relative: 5 %
Neutro Abs: 5.8 10*3/uL (ref 1.4–6.5)
Neutrophils Relative %: 83 %
Platelets: 392 10*3/uL (ref 150–440)
RBC: 3.42 MIL/uL — ABNORMAL LOW (ref 3.80–5.20)
RDW: 15.8 % — ABNORMAL HIGH (ref 11.5–14.5)
WBC: 6.9 10*3/uL (ref 3.6–11.0)

## 2017-02-23 MED ORDER — SODIUM CHLORIDE 0.9 % IV SOLN: 200.0000 mg | Freq: Once | INTRAVENOUS | Status: DC

## 2017-02-23 MED ORDER — IRON SUCROSE 20 MG/ML IV SOLN
200.0000 mg | Freq: Once | INTRAVENOUS | Status: AC
  Administered 2017-02-23: 200 mg via INTRAVENOUS
  Filled 2017-02-23: qty 10

## 2017-02-23 MED ORDER — SODIUM CHLORIDE 0.9 % IV SOLN
Freq: Once | INTRAVENOUS | Status: AC
  Administered 2017-02-23: 11:00:00 via INTRAVENOUS
  Filled 2017-02-23: qty 1000

## 2017-02-23 NOTE — Progress Notes (Signed)
Patient here today as new evaluation regarding iron deficiency anemia.  Referred by Dr. Anna Genreonroy.

## 2017-02-23 NOTE — Patient Instructions (Signed)

## 2017-02-25 DIAGNOSIS — D509 Iron deficiency anemia, unspecified: Secondary | ICD-10-CM | POA: Diagnosis not present

## 2017-02-27 ENCOUNTER — Encounter: Payer: Self-pay | Admitting: Hematology and Oncology

## 2017-03-02 ENCOUNTER — Inpatient Hospital Stay: Payer: PPO

## 2017-03-02 VITALS — BP 114/78 | HR 75 | Temp 97.3°F | Resp 18

## 2017-03-02 DIAGNOSIS — D509 Iron deficiency anemia, unspecified: Secondary | ICD-10-CM | POA: Diagnosis not present

## 2017-03-02 MED ORDER — SODIUM CHLORIDE 0.9 % IV SOLN
Freq: Once | INTRAVENOUS | Status: AC
  Administered 2017-03-02: 10:00:00 via INTRAVENOUS
  Filled 2017-03-02: qty 1000

## 2017-03-02 MED ORDER — SODIUM CHLORIDE 0.9 % IV SOLN: 200.0000 mg | Freq: Once | INTRAVENOUS | Status: DC

## 2017-03-02 MED ORDER — IRON SUCROSE 20 MG/ML IV SOLN
200.0000 mg | Freq: Once | INTRAVENOUS | Status: AC
  Administered 2017-03-02: 200 mg via INTRAVENOUS
  Filled 2017-03-02: qty 10

## 2017-03-02 NOTE — Patient Instructions (Signed)

## 2017-03-03 ENCOUNTER — Encounter: Payer: Self-pay | Admitting: Physician Assistant

## 2017-03-09 ENCOUNTER — Inpatient Hospital Stay: Payer: PPO

## 2017-03-09 VITALS — BP 151/88 | HR 63 | Temp 97.4°F | Resp 18

## 2017-03-09 DIAGNOSIS — D509 Iron deficiency anemia, unspecified: Secondary | ICD-10-CM | POA: Diagnosis not present

## 2017-03-09 MED ORDER — SODIUM CHLORIDE 0.9 % IV SOLN: 200.0000 mg | Freq: Once | INTRAVENOUS | Status: DC

## 2017-03-09 MED ORDER — SODIUM CHLORIDE 0.9 % IV SOLN
Freq: Once | INTRAVENOUS | Status: AC
Start: 2017-03-09 — End: 2017-03-09
  Administered 2017-03-09: 10:00:00 via INTRAVENOUS
  Filled 2017-03-09: qty 1000

## 2017-03-09 MED ORDER — IRON SUCROSE 20 MG/ML IV SOLN
200.0000 mg | Freq: Once | INTRAVENOUS | Status: AC
  Administered 2017-03-09: 200 mg via INTRAVENOUS
  Filled 2017-03-09: qty 10

## 2017-03-09 NOTE — Patient Instructions (Signed)

## 2017-03-15 ENCOUNTER — Inpatient Hospital Stay: Payer: PPO

## 2017-03-15 VITALS — BP 131/79 | HR 73 | Temp 97.7°F | Resp 18

## 2017-03-15 DIAGNOSIS — D509 Iron deficiency anemia, unspecified: Secondary | ICD-10-CM | POA: Diagnosis not present

## 2017-03-15 MED ORDER — SODIUM CHLORIDE 0.9 % IV SOLN
Freq: Once | INTRAVENOUS | Status: AC
  Administered 2017-03-15: 14:00:00 via INTRAVENOUS
  Filled 2017-03-15: qty 1000

## 2017-03-15 MED ORDER — IRON SUCROSE 20 MG/ML IV SOLN
200.0000 mg | Freq: Once | INTRAVENOUS | Status: AC
  Administered 2017-03-15: 200 mg via INTRAVENOUS

## 2017-03-15 MED ORDER — IRON SUCROSE 20 MG/ML IV SOLN: 200.0000 mg | Freq: Once | INTRAVENOUS | Status: DC

## 2017-03-24 DIAGNOSIS — D509 Iron deficiency anemia, unspecified: Secondary | ICD-10-CM | POA: Diagnosis not present

## 2017-03-30 ENCOUNTER — Inpatient Hospital Stay: Payer: PPO | Attending: Hematology and Oncology

## 2017-03-30 DIAGNOSIS — K3189 Other diseases of stomach and duodenum: Secondary | ICD-10-CM | POA: Diagnosis not present

## 2017-03-30 DIAGNOSIS — K449 Diaphragmatic hernia without obstruction or gangrene: Secondary | ICD-10-CM | POA: Diagnosis not present

## 2017-03-30 DIAGNOSIS — K317 Polyp of stomach and duodenum: Secondary | ICD-10-CM | POA: Diagnosis not present

## 2017-03-30 DIAGNOSIS — D509 Iron deficiency anemia, unspecified: Secondary | ICD-10-CM | POA: Diagnosis not present

## 2017-03-31 DIAGNOSIS — K3189 Other diseases of stomach and duodenum: Secondary | ICD-10-CM | POA: Diagnosis not present

## 2017-03-31 DIAGNOSIS — K317 Polyp of stomach and duodenum: Secondary | ICD-10-CM | POA: Diagnosis not present

## 2017-04-05 ENCOUNTER — Other Ambulatory Visit: Payer: PPO

## 2017-04-06 ENCOUNTER — Ambulatory Visit: Payer: PPO | Admitting: Hematology and Oncology

## 2017-04-06 ENCOUNTER — Other Ambulatory Visit: Payer: PPO

## 2017-04-06 ENCOUNTER — Ambulatory Visit: Payer: PPO

## 2017-04-06 DIAGNOSIS — R413 Other amnesia: Secondary | ICD-10-CM | POA: Diagnosis not present

## 2017-04-06 DIAGNOSIS — D509 Iron deficiency anemia, unspecified: Secondary | ICD-10-CM | POA: Diagnosis not present

## 2017-04-06 DIAGNOSIS — M545 Low back pain: Secondary | ICD-10-CM | POA: Diagnosis not present

## 2017-04-06 DIAGNOSIS — I10 Essential (primary) hypertension: Secondary | ICD-10-CM | POA: Diagnosis not present

## 2017-04-06 DIAGNOSIS — Z9181 History of falling: Secondary | ICD-10-CM | POA: Diagnosis not present

## 2017-04-06 DIAGNOSIS — Z6834 Body mass index (BMI) 34.0-34.9, adult: Secondary | ICD-10-CM | POA: Diagnosis not present

## 2017-04-06 DIAGNOSIS — Z139 Encounter for screening, unspecified: Secondary | ICD-10-CM | POA: Diagnosis not present

## 2017-04-11 ENCOUNTER — Other Ambulatory Visit: Payer: PPO

## 2017-04-12 NOTE — Progress Notes (Deleted)
[facility]  Clinic day:  04/12/2017  Chief Complaint: Gina Lowe is a 71 y.o. female with iron deficiency anemia who is seen for 6 week assessment after IV iron.  HPI: The patient was last seen in the medical oncology clinic on 02/23/2017  At that time, she was seen for initial consultation.  She had progressive iron deficiency anemia despite oral iron for the past 6 months.  Diet appeared good.  She denied any hematochezia, melena, hematuria or vaginal bleeding.  Urinalysis revealed no hematuria.  She had guaiac + stool.    Work-up on 02/23/2017 revealed a hematocrit of 27.3, hemoglobin 8.4, MCV 79.9, platelets 392,000, white count 6900 with an ANC of 5800.  Ferritin was 35. Iron studies included a saturation of 6% and a TIBC of 493.  She received Venofer weekly x 4 (02/23/2017 - 03/15/2017).  Symptomatically,           Past Medical History:  Diagnosis Date  . Anemia   . Arthritis    "knees" (01/14/2015)  . Carpal tunnel syndrome   . Chronic back pain   . Chronic lower back pain   . Chronic pain   . Chronic prescription opiate use   . Depression   . Diverticulosis   . Erosive esophagitis   . Fibromyalgia   . GERD (gastroesophageal reflux disease)   . High cholesterol   . History of bleeding peptic ulcer   . History of hiatal hernia   . Hypertension   . IBS (irritable bowel syndrome)   . Osteoarthritis   . Peripheral neuropathy    "hands" (01/14/2015)  . Recurrent falls   . Syncope and collapse    "today is the 5th or 6th time in the last year" (01/14/2015)    Past Surgical History:  Procedure Laterality Date  . ABDOMINAL HYSTERECTOMY  1970's  . BACK SURGERY    . BLADDER SUSPENSION    . BUNIONECTOMY Left 1970's?  Marland Kitchen JOINT REPLACEMENT    . LAPAROSCOPIC CHOLECYSTECTOMY  1980's?  . LUMBAR FUSION  2007   Dr. Lovell Sheehan  . SPINAL CORD STIMULATOR IMPLANT  ~ 2014/2015  . TOTAL KNEE ARTHROPLASTY Left 2012  . TOTAL KNEE ARTHROPLASTY  Right 08/2014  . TOTAL KNEE ARTHROPLASTY Left   . TUBAL LIGATION  1960's    Family History  Problem Relation Age of Onset  . Hypertension Mother   . Stroke Mother   . Heart disease Father     Social History:  reports that she has never smoked. She has never used smokeless tobacco. She reports that she does not drink alcohol or use drugs.  She denies any exposure to radiation or toxins. 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  She lives alone in [place].  The patient is accompanied by Gina Lowe, daughter-in-law, today.  Allergies: No Known Allergies  Current Medications: Current Outpatient Prescriptions  Medication Sig Dispense Refill  . acetaminophen (TYLENOL) 500 MG tablet Take 500 mg by mouth every 6 (six) hours as needed.    Marland Kitchen albuterol (VENTOLIN HFA) 108 (90 Base) MCG/ACT inhaler Inhale 2 puffs into the lungs every 6 (six) hours as needed. Wheezing and shortness of breath    . amLODipine (NORVASC) 10 MG tablet Take 10 mg by mouth daily.    Marland Kitchen aspirin EC 81 MG tablet Take 81 mg by mouth daily.    Marland Kitchen atorvastatin (LIPITOR) 20 MG tablet Take 20 mg  by mouth daily.  1  . calcium carbonate (OSCAL) 1500 (600 Ca) MG TABS tablet Take 1 tablet by mouth daily.    Marland Kitchen dexamethasone (DECADRON) 6 MG tablet Take 2 tablets (12 mg total) by mouth once. (Patient not taking: Reported on 02/23/2017) 2 tablet 0  . ferrous sulfate 325 (65 FE) MG tablet Take 1 tablet (325 mg total) by mouth daily with breakfast. 30 tablet 3  . gabapentin (NEURONTIN) 800 MG tablet Take 800 mg by mouth 3 (three) times daily.  5  . KLOR-CON 10 10 MEQ tablet Take 20 mEq by mouth daily.  5  . losartan-hydrochlorothiazide (HYZAAR) 100-25 MG tablet losartan 100 mg-hydrochlorothiazide 25 mg tablet  TAKE 1 TABLET BY MOUTH EVERY DAY FOR BLOOD PRESSURE    . mirtazapine (REMERON) 45 MG tablet Take 45 mg by mouth daily.  5  . ondansetron (ZOFRAN ODT) 8 MG disintegrating tablet Take 1 tablet (8 mg  total) by mouth every 8 (eight) hours as needed for nausea or vomiting. (Patient not taking: Reported on 02/23/2017) 20 tablet 0  . Oxycodone HCl 20 MG TABS Take 1 tablet (20 mg total) by mouth every 6 (six) hours as needed (pain).  0  . pantoprazole (PROTONIX) 40 MG tablet Take 40 mg by mouth daily.  11  . PARoxetine (PAXIL) 30 MG tablet Take 30 mg by mouth daily.    . pregabalin (LYRICA) 150 MG capsule Take 150 mg by mouth 2 (two) times daily.    . traZODone (DESYREL) 100 MG tablet Take 100 mg by mouth at bedtime.  0   No current facility-administered medications for this visit.     Review of Systems:  GENERAL:  Fatigue.  No fevers, sweats or weight loss. PERFORMANCE STATUS (ECOG):  1 HEENT:  No visual changes, runny nose, sore throat, mouth sores or tenderness. Lungs:  Shortness of breath with exertion.  No cough.  No hemoptysis. Cardiac:  No chest pain, palpitations, orthopnea, or PND. GI:  No nausea, vomiting, diarrhea, constipation, or hematochezia.  Guaiac + stool.  Stool sometimes dark. GU:  No urgency, frequency, dysuria, or hematuria. Musculoskeletal: Back pain.  No joint pain.  No muscle tenderness. Extremities:  No pain or swelling. Skin:  No rashes or skin changes. Neuro:  Memory poor.  Headache.  No headache, numbness or weakness, balance or coordination issues. Endocrine:  No diabetes, thyroid issues, hot flashes or night sweats. Psych:  No mood changes, depression or anxiety. Pain:  Back pain. Review of systems:  All other systems reviewed and found to be negative.  Physical Exam: There were no vitals taken for this visit. GENERAL:  Well developed, well nourished, woman sitting comfortably in the exam room in no acute distress. MENTAL STATUS:  Alert and oriented to person, place and time. HEAD: Styled brown hair.  Normocephalic, atraumatic, face symmetric, no Cushingoid features. EYES:  Blue eyes.  Pupils equal round and reactive to light and accomodation.  No  conjunctivitis or scleral icterus. ENT:  Oropharynx clear without lesion.  Tongue normal. Mucous membranes moist.  RESPIRATORY:  Clear to auscultation without rales, wheezes or rhonchi. CARDIOVASCULAR:  Regular rate and rhythm without murmur, rub or gallop. ABDOMEN:  Soft, non-tender, with active bowel sounds, and no hepatosplenomegaly.  No masses. SKIN:  No rashes, ulcers or lesions. EXTREMITIES: No edema, no skin discoloration or tenderness.  No palpable cords. LYMPH NODES: No palpable cervical, supraclavicular, axillary or inguinal adenopathy  NEUROLOGICAL: Unremarkable. PSYCH:  Appropriate.   No visits with results  within 3 Day(s) from this visit.  Latest known visit with results is:  Appointment on 02/23/2017  Component Date Value Ref Range Status  . WBC 02/23/2017 6.9  3.6 - 11.0 K/uL Final  . RBC 02/23/2017 3.42* 3.80 - 5.20 MIL/uL Final  . Hemoglobin 02/23/2017 8.4* 12.0 - 16.0 g/dL Final  . HCT 16/10/960406/02/2017 27.3* 35.0 - 47.0 % Final  . MCV 02/23/2017 79.9* 80.0 - 100.0 fL Final  . MCH 02/23/2017 24.6* 26.0 - 34.0 pg Final  . MCHC 02/23/2017 30.8* 32.0 - 36.0 g/dL Final  . RDW 54/09/811906/02/2017 15.8* 11.5 - 14.5 % Final  . Platelets 02/23/2017 392  150 - 440 K/uL Final  . Neutrophils Relative % 02/23/2017 83  % Final  . Neutro Abs 02/23/2017 5.8  1.4 - 6.5 K/uL Final  . Lymphocytes Relative 02/23/2017 9  % Final  . Lymphs Abs 02/23/2017 0.6* 1.0 - 3.6 K/uL Final  . Monocytes Relative 02/23/2017 5  % Final  . Monocytes Absolute 02/23/2017 0.4  0.2 - 0.9 K/uL Final  . Eosinophils Relative 02/23/2017 2  % Final  . Eosinophils Absolute 02/23/2017 0.1  0 - 0.7 K/uL Final  . Basophils Relative 02/23/2017 1  % Final  . Basophils Absolute 02/23/2017 0.0  0 - 0.1 K/uL Final  . Ferritin 02/23/2017 35  11 - 307 ng/mL Final  . Iron 02/23/2017 29  28 - 170 ug/dL Final  . TIBC 14/78/295606/02/2017 493* 250 - 450 ug/dL Final  . Saturation Ratios 02/23/2017 6* 10.4 - 31.8 % Final  . UIBC 02/23/2017 464   ug/dL Final  . Blood Bank Specimen 02/23/2017 SAMPLE AVAILABLE FOR TESTING   Final  . Sample Expiration 02/23/2017 02/26/2017   Final    Assessment:  Gina Lowe is a 71 y.o. female with progressive iron deficiency anemia despite oral iron for the past 6 months.  Diet appears good.  She denies any hematochezia, melena, hematuria or vaginal bleeding.  Urinalysis on 06/07/2016 revealed no hematuria.  She has had guaiac + stool.  Last colonoscopy was 8 years ago.  She has had ice pica x 3 weeks.  CBC on 02/18/2017 revealed a hematocrit of 25.0, hemoglobin 7.8, MCV 79, platelets 405,000, and WBC 5700 with an ANC of 3900.  Ferritin was 11.  She received Venofer weekly x 4 (02/23/2017 - 03/15/2017).  Symptomatically, she is fatigued.  She has shortness of breath with exertion.  Exam is unremarkable.  Plan: 1.  Labs today:  CBC with diff, ferritin. 2.    Rosey BathMelissa C Corcoran, MD  04/12/2017, 6:17 PM

## 2017-04-13 ENCOUNTER — Inpatient Hospital Stay: Payer: PPO

## 2017-04-13 ENCOUNTER — Inpatient Hospital Stay: Payer: PPO | Admitting: Hematology and Oncology

## 2017-04-13 ENCOUNTER — Emergency Department (HOSPITAL_COMMUNITY)
Admission: EM | Admit: 2017-04-13 | Discharge: 2017-04-13 | Disposition: A | Discharge status: Home / Self Care | Payer: PPO | Attending: Emergency Medicine | Admitting: Emergency Medicine

## 2017-04-13 ENCOUNTER — Encounter (HOSPITAL_COMMUNITY): Payer: Self-pay | Admitting: Emergency Medicine

## 2017-04-13 DIAGNOSIS — I6789 Other cerebrovascular disease: Secondary | ICD-10-CM | POA: Diagnosis not present

## 2017-04-13 DIAGNOSIS — Z7982 Long term (current) use of aspirin: Secondary | ICD-10-CM | POA: Diagnosis not present

## 2017-04-13 DIAGNOSIS — E876 Hypokalemia: Secondary | ICD-10-CM

## 2017-04-13 DIAGNOSIS — R110 Nausea: Secondary | ICD-10-CM | POA: Diagnosis present

## 2017-04-13 DIAGNOSIS — Z79899 Other long term (current) drug therapy: Secondary | ICD-10-CM | POA: Diagnosis not present

## 2017-04-13 DIAGNOSIS — N390 Urinary tract infection, site not specified: Secondary | ICD-10-CM | POA: Diagnosis not present

## 2017-04-13 DIAGNOSIS — R251 Tremor, unspecified: Secondary | ICD-10-CM | POA: Diagnosis not present

## 2017-04-13 DIAGNOSIS — I10 Essential (primary) hypertension: Secondary | ICD-10-CM | POA: Insufficient documentation

## 2017-04-13 DIAGNOSIS — R9431 Abnormal electrocardiogram [ECG] [EKG]: Secondary | ICD-10-CM | POA: Diagnosis not present

## 2017-04-13 LAB — URINALYSIS, ROUTINE W REFLEX MICROSCOPIC
BILIRUBIN URINE: NEGATIVE
Glucose, UA: NEGATIVE mg/dL
Hgb urine dipstick: NEGATIVE
KETONES UR: NEGATIVE mg/dL
NITRITE: POSITIVE — AB
PH: 7 (ref 5.0–8.0)
PROTEIN: NEGATIVE mg/dL
Specific Gravity, Urine: 1.012 (ref 1.005–1.030)

## 2017-04-13 LAB — COMPREHENSIVE METABOLIC PANEL
ALBUMIN: 3.8 g/dL (ref 3.5–5.0)
ALT: 14 U/L (ref 14–54)
AST: 23 U/L (ref 15–41)
Alkaline Phosphatase: 62 U/L (ref 38–126)
Anion gap: 10 (ref 5–15)
BUN: 17 mg/dL (ref 6–20)
CHLORIDE: 101 mmol/L (ref 101–111)
CO2: 27 mmol/L (ref 22–32)
CREATININE: 0.73 mg/dL (ref 0.44–1.00)
Calcium: 9.4 mg/dL (ref 8.9–10.3)
GFR calc Af Amer: >60 mL/min (ref >60)
GFR calc non Af Amer: >60 mL/min (ref >60)
GLUCOSE: 102 mg/dL — AB (ref 65–99)
POTASSIUM: 3.1 mmol/L — AB (ref 3.5–5.1)
SODIUM: 138 mmol/L (ref 135–145)
Total Bilirubin: 0.3 mg/dL (ref 0.3–1.2)
Total Protein: 6.1 g/dL — ABNORMAL LOW (ref 6.5–8.1)

## 2017-04-13 LAB — CBC WITH DIFFERENTIAL/PLATELET
BASOS ABS: 0 10*3/uL (ref 0.0–0.1)
BASOS PCT: 0 %
EOS PCT: 1 %
Eosinophils Absolute: 0.1 10*3/uL (ref 0.0–0.7)
HCT: 36.6 % (ref 36.0–46.0)
Hemoglobin: 11.5 g/dL — ABNORMAL LOW (ref 12.0–15.0)
LYMPHS PCT: 12 %
Lymphs Abs: 0.7 10*3/uL (ref 0.7–4.0)
MCH: 28.1 pg (ref 26.0–34.0)
MCHC: 31.4 g/dL (ref 30.0–36.0)
MCV: 89.5 fL (ref 78.0–100.0)
MONO ABS: 0.2 10*3/uL (ref 0.1–1.0)
Monocytes Relative: 3 %
Neutro Abs: 5.1 10*3/uL (ref 1.7–7.7)
Neutrophils Relative %: 84 %
PLATELETS: 213 10*3/uL (ref 150–400)
RBC: 4.09 MIL/uL (ref 3.87–5.11)
RDW: 17.9 % — AB (ref 11.5–15.5)
WBC: 6.1 10*3/uL (ref 4.0–10.5)

## 2017-04-13 LAB — LIPASE, BLOOD: LIPASE: 45 U/L (ref 11–51)

## 2017-04-13 LAB — TROPONIN I: Troponin I: <0.03 ng/mL (ref <0.03)

## 2017-04-13 MED ORDER — METOCLOPRAMIDE HCL 5 MG/ML IJ SOLN
10.0000 mg | Freq: Once | INTRAMUSCULAR | Status: AC
  Administered 2017-04-13: 10 mg via INTRAVENOUS
  Filled 2017-04-13: qty 2

## 2017-04-13 MED ORDER — CEPHALEXIN 500 MG PO CAPS: 500.0000 mg | ORAL_CAPSULE | Freq: Three times a day (TID) | ORAL | 0 refills | Status: AC

## 2017-04-13 MED ORDER — POTASSIUM CHLORIDE CRYS ER 20 MEQ PO TBCR
40.0000 meq | EXTENDED_RELEASE_TABLET | Freq: Once | ORAL | Status: AC
  Administered 2017-04-13: 40 meq via ORAL
  Filled 2017-04-13: qty 2

## 2017-04-13 MED ORDER — DEXTROSE 5 % IV SOLN
1.0000 g | INTRAVENOUS | Status: DC
  Administered 2017-04-13: 1 g via INTRAVENOUS
  Filled 2017-04-13 (×2): qty 10

## 2017-04-13 MED ORDER — METOCLOPRAMIDE HCL 10 MG PO TABS: 10.0000 mg | ORAL_TABLET | Freq: Three times a day (TID) | ORAL | 0 refills | Status: DC

## 2017-04-13 MED ORDER — METOCLOPRAMIDE HCL 10 MG PO TABS: 10.0000 mg | ORAL_TABLET | Freq: Three times a day (TID) | ORAL | 0 refills | Status: AC

## 2017-04-13 MED ORDER — ACETAMINOPHEN 325 MG PO TABS
650.0000 mg | ORAL_TABLET | Freq: Once | ORAL | Status: AC
  Administered 2017-04-13: 650 mg via ORAL
  Filled 2017-04-13: qty 2

## 2017-04-13 MED ORDER — MAGNESIUM SULFATE 2 GM/50ML IV SOLN
2.0000 g | Freq: Once | INTRAVENOUS | Status: AC
  Administered 2017-04-13: 2 g via INTRAVENOUS
  Filled 2017-04-13: qty 50

## 2017-04-13 MED ORDER — HYDRALAZINE HCL 20 MG/ML IJ SOLN
10.0000 mg | Freq: Once | INTRAMUSCULAR | Status: AC
  Administered 2017-04-13: 10 mg via INTRAVENOUS
  Filled 2017-04-13: qty 1

## 2017-04-13 MED ORDER — DIPHENHYDRAMINE HCL 50 MG/ML IJ SOLN
12.5000 mg | Freq: Once | INTRAMUSCULAR | Status: AC
  Administered 2017-04-13: 12.5 mg via INTRAVENOUS
  Filled 2017-04-13: qty 1

## 2017-04-13 NOTE — ED Notes (Signed)
Assisted pt using bedpan 

## 2017-04-13 NOTE — ED Provider Notes (Signed)
[facility]-EMERGENCY DEPT Provider Note   CSN: [id number] Arrival date & time: 04/13/17  0756     History   Chief Complaint Chief Complaint  Patient presents with  . Nausea    HPI Gina Lowe is a 71 y.o. female with a past medical history significant for depression, chronic back pain, HTN, iron deficiency anemia who present today complaining of nausea. Patient reports that symptoms started on Monday, she has had intermittent nausea but denies any vomiting. Patient reports yesterday evening nausea worsened. Patient was brought in by EMS as she lives alone with family member living close by. Patient was given Zofran at home and during transport with some improvement in her symptomps. Patient endorses mild abdominal pain and reports regular BM and denies any blood in her stools or melena. This morning patient has a mild headache. Patient denies any chest pain, shortness of breath, cough, dizziness, palpitations. Patient has a history of nausea and had a scheduled appointment with [facility] to follow up on possible GI bleed with her history of low hemoglobin and guaic+ but has not seen them yet.  HPI  Past Medical History:  Diagnosis Date  . Anemia   . Arthritis    "knees" (01/14/2015)  . Carpal tunnel syndrome   . Chronic back pain   . Chronic lower back pain   . Chronic pain   . Chronic prescription opiate use   . Depression   . Diverticulosis   . Erosive esophagitis   . Fibromyalgia   . GERD (gastroesophageal reflux disease)   . High cholesterol   . History of bleeding peptic ulcer   . History of hiatal hernia   . Hypertension   . IBS (irritable bowel syndrome)   . Osteoarthritis   . Peripheral neuropathy    "hands" (01/14/2015)  . Recurrent falls   . Syncope and collapse    "today is the 5th or 6th time in the last year" (01/14/2015)    Patient Active Problem List   Diagnosis Date Noted  . Iron deficiency anemia 02/23/2017  . Back pain 02/29/2016  . Nausea and  vomiting 02/29/2016  . Gait instability   . Midline low back pain without sciatica   . HLD (hyperlipidemia)   . Essential hypertension   . Aphasia 06/18/2015  . Altered mental status   . Fall at home   . Faintness   . Acute kidney injury (HCC) 01/14/2015  . Syncope 01/14/2015  . Hypophosphatemia 02/09/2013  . Nausea & vomiting 02/08/2013  . Diarrhea 02/08/2013  . Hypertension 02/08/2013    Past Surgical History:  Procedure Laterality Date  . ABDOMINAL HYSTERECTOMY  1970's  . BACK SURGERY    . BLADDER SUSPENSION    . BUNIONECTOMY Left 1970's?  Marland Kitchen. JOINT REPLACEMENT    . LAPAROSCOPIC CHOLECYSTECTOMY  1980's?  . LUMBAR FUSION  2007   Dr. Lovell SheehanJenkins  . SPINAL CORD STIMULATOR IMPLANT  ~ 2014/2015  . TOTAL KNEE ARTHROPLASTY Left 2012  . TOTAL KNEE ARTHROPLASTY Right 08/2014  . TOTAL KNEE ARTHROPLASTY Left   . TUBAL LIGATION  1960's    OB History    No data available       Home Medications    Prior to Admission medications   Medication Sig Start Date End Date Taking? Authorizing Provider  acetaminophen (TYLENOL) 500 MG tablet Take 500 mg by mouth every 6 (six) hours as needed.    [provider]  albuterol (VENTOLIN HFA) 108 (90 Base) MCG/ACT inhaler Inhale 2  puffs into the lungs every 6 (six) hours as needed. Wheezing and shortness of breath 10/18/12   [provider]  amLODipine (NORVASC) 10 MG tablet Take 10 mg by mouth daily.    [provider]  aspirin EC 81 MG tablet Take 81 mg by mouth daily.    [provider]  atorvastatin (LIPITOR) 20 MG tablet Take 20 mg by mouth daily. 02/17/16   [provider]  calcium carbonate (OSCAL) 1500 (600 Ca) MG TABS tablet Take 1 tablet by mouth daily.    [provider]  dexamethasone (DECADRON) 6 MG tablet Take 2 tablets (12 mg total) by mouth once. Patient not taking: Reported on 02/23/2017 02/29/16   Raeford RazorKohut, Stephen, MD  ferrous sulfate 325 (65 FE) MG tablet Take 1 tablet (325 mg total)  by mouth daily with breakfast. 06/19/15   John Giovanniathore, Vasundhra, MD  gabapentin (NEURONTIN) 800 MG tablet Take 800 mg by mouth 3 (three) times daily. 11/30/16   [provider]  KLOR-CON 10 10 MEQ tablet Take 20 mEq by mouth daily. 02/19/16   [provider]  losartan-hydrochlorothiazide (HYZAAR) 100-25 MG tablet losartan 100 mg-hydrochlorothiazide 25 mg tablet  TAKE 1 TABLET BY MOUTH EVERY DAY FOR BLOOD PRESSURE    [provider]  mirtazapine (REMERON) 45 MG tablet Take 45 mg by mouth daily. 02/15/17   [provider]  ondansetron (ZOFRAN ODT) 8 MG disintegrating tablet Take 1 tablet (8 mg total) by mouth every 8 (eight) hours as needed for nausea or vomiting. Patient not taking: Reported on 02/23/2017 01/17/16   Lorre NickAllen, Anthony, MD  Oxycodone HCl 20 MG TABS Take 1 tablet (20 mg total) by mouth every 6 (six) hours as needed (pain). 06/19/15   John Giovanniathore, Vasundhra, MD  pantoprazole (PROTONIX) 40 MG tablet Take 40 mg by mouth daily. 02/06/16   [provider]  PARoxetine (PAXIL) 30 MG tablet Take 30 mg by mouth daily.    [provider]  pregabalin (LYRICA) 150 MG capsule Take 150 mg by mouth 2 (two) times daily.    [provider]  traZODone (DESYREL) 100 MG tablet Take 100 mg by mouth at bedtime. 01/28/16   [provider]    Family History Family History  Problem Relation Age of Onset  . Hypertension Mother   . Stroke Mother   . Heart disease Father     Social History Social History  Substance Use Topics  . Smoking status: Never Smoker  . Smokeless tobacco: Never Used  . Alcohol use No     Allergies   Patient has no known allergies.   Review of Systems Review of Systems  Constitutional: Negative.   HENT: Negative.   Eyes: Negative.   Respiratory: Negative.   Cardiovascular: Negative.   Gastrointestinal: Positive for abdominal pain and nausea.  Endocrine: Negative.   Genitourinary: Negative.   Musculoskeletal:  Negative.   Allergic/Immunologic: Negative.   Neurological: Negative.   Hematological: Negative.   Psychiatric/Behavioral: Negative.      Physical Exam Updated Vital Signs BP (!) 163/90 (BP Location: Right Arm)   Pulse (!) 55   Temp 98.2 F (36.8 C) (Oral)   Resp 14   Ht 4\' 11"  (1.499 m)   Wt 72.6 kg (160 lb)   SpO2 96%   BMI 32.32 kg/m   Physical Exam  Constitutional: She appears well-developed.  HENT:  Head: Normocephalic and atraumatic.  Eyes: Pupils are equal, round, and reactive to light. Conjunctivae and EOM are normal.  Neck:  Normal range of motion. Neck supple.  Cardiovascular: Regular rhythm and normal heart sounds.   Pulmonary/Chest: Effort normal and breath sounds normal.  Abdominal: Soft. Bowel sounds are normal.     ED Treatments / Results  Labs (all labs ordered are listed, but only abnormal results are displayed) Labs Reviewed  CBC WITH DIFFERENTIAL/PLATELET  COMPREHENSIVE METABOLIC PANEL  LIPASE, BLOOD  TROPONIN I  URINALYSIS, ROUTINE W REFLEX MICROSCOPIC    EKG  EKG Interpretation  Date/Time:  Wednesday April 13 2017 08:13:16 EDT Ventricular Rate:  74 PR Interval:    QRS Duration: 100 QT Interval:  453 QTC Calculation: 503 R Axis:   8 Text Interpretation:  Sinus arrhythmia Abnormal R-wave progression, early transition Baseline wander in lead(s) II III aVF Prolonged QT No significant change since last tracing Confirmed by Shaune Pollack [phone]) on 04/13/2017 8:29:21 AM       Radiology No results found.  Procedures Procedures (including critical care time)  Medications Ordered in ED Medications - No data to display   Initial Impression / Assessment and Plan / ED Course  I have reviewed the triage vital signs and the nursing notes.  Pertinent labs & imaging results that were available during my care of the patient were reviewed by me and considered in my medical decision making (see chart for details).   Patient is a 71 yo female  who presents with intermittent nausea for the past 2 days and mild abdominal pain. Patient has a history of anemia and guaiac positive but no blood in stool or melena. UA was positive for trace leukocytes and positive nitrite consistent with UTI. Patient will receive rocephin 1 gm. BMP also showed K+ 3.1 will start patient on Kdur. Patient will be discharged with Keflex and antiemetic  Final Clinical Impressions(s) / ED Diagnoses   Final diagnoses:  None    New Prescriptions New Prescriptions   No medications on file     Lovena Neighbours, MD 04/13/17 1339    Shaune Pollack, MD 04/15/17 1051

## 2017-04-13 NOTE — Discharge Instructions (Signed)
You will take Keflex an antibiotic three times a day for 7 days. We will also prescribe some anti nausea medications for you to take as needed. Please follow up with your primary care provider in the next two days.

## 2017-04-13 NOTE — ED Triage Notes (Signed)
To ED via [facility] from home-- pt c/o nausea that started at approx 1230am, received zofran ODT 4mg  x 2 per EMS. Pt is alert/oriented x 4, diaphoretic, denies fever. Denies any acute pain-- has hx of chronic back pain.

## 2017-04-13 NOTE — ED Notes (Signed)
Pt states having HA and nausea Evangeline GulaKaren RN made aware.

## 2017-05-03 NOTE — Progress Notes (Signed)
[facility]-  Cancer Center  Clinic day:  05/04/2017  Chief Complaint: Gina Lowe is a 71 y.o. female with iron deficiency anemia who is seen for 6 week assessment following initiation of IV iron.  HPI: Patient was last seen in the hematology clinic on 02/23/2017 for initial oconsultation.  She had progressive iron deficiency anemia despite oral iron for the past 6 months.  Diet appeared good.  She denied any hematochezia, melena, hematuria or vaginal bleeding.  She had guaiac + stool.  Last colonoscopy was 8 years ago.  She had ice pica.  Labs at last visit included a hemoglobin 8.4, hematocrit 27.3, and MCV 79.9. Her iron studies at that time revealed a TIBC of 493 with an iron saturation of 6%.  Ferritin was 35. She received Venofer 3 doses (02/23/2017 - 03/15/2017).  During her last visit, patient reported that she had an appointment with gastroenterology with [facility] in [place]. Patient has since then been seen and had an EGD and colonoscopy, which revealed a hiatal hernia and gastritis. Patient on daily PPI therapy. Her prep for the colonoscopy was inadequate, and the study was not able to be performed. Patient reports that she does not wish to have the colonoscopy repeated.   She was seen in the ER on 04/13/2017 with severe nausea and non-specific abdominal pain. UA was positive for trace leukocytes and positive nitrite.  She was treated with Rocephin and discharged on a course of Keflex. CBC revealed a hematocrit of 36.6, hemoglobin 11.5, MCV 89.5, platelets 213,000, and WBC 6100.  Potassium was 3.1; started on potassium supplementation (K-Dur).   Symptomatically, she reports that she is feeling better overall.  She has a continued shortness of breath with exertion. She is able to perform all of her activities of daily living.             Past Medical History:  Diagnosis Date  . Anemia   . Arthritis    "knees" (01/14/2015)  . Carpal tunnel syndrome   .  Chronic back pain   . Chronic lower back pain   . Chronic pain   . Chronic prescription opiate use   . Depression   . Diverticulosis   . Erosive esophagitis   . Fibromyalgia   . GERD (gastroesophageal reflux disease)   . High cholesterol   . History of bleeding peptic ulcer   . History of hiatal hernia   . Hypertension   . IBS (irritable bowel syndrome)   . Osteoarthritis   . Peripheral neuropathy    "hands" (01/14/2015)  . Recurrent falls   . Syncope and collapse    "today is the 5th or 6th time in the last year" (01/14/2015)    Past Surgical History:  Procedure Laterality Date  . ABDOMINAL HYSTERECTOMY  1970's  . BACK SURGERY    . BLADDER SUSPENSION    . BUNIONECTOMY Left 1970's?  Marland Kitchen JOINT REPLACEMENT    . LAPAROSCOPIC CHOLECYSTECTOMY  1980's?  . LUMBAR FUSION  2007   Dr. Lovell Sheehan  . SPINAL CORD STIMULATOR IMPLANT  ~ 2014/2015  . TOTAL KNEE ARTHROPLASTY Left 2012  . TOTAL KNEE ARTHROPLASTY Right 08/2014  . TOTAL KNEE ARTHROPLASTY Left   . TUBAL LIGATION  1960's    Family History  Problem Relation Age of Onset  . Hypertension Mother   . Stroke Mother   . Heart disease Father     Social History:  reports that she has never smoked. She has never used smokeless  tobacco. She reports that she does not drink alcohol or use drugs.  She denies any exposure to radiation or toxins. She is Midwiferetires.  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  The patient is accompanied by Alveda ReasonsAngie Cox, daughter-in-law, today.  Allergies: No Known Allergies  Current Medications: Current Outpatient Prescriptions  Medication Sig Dispense Refill  . acetaminophen (TYLENOL) 500 MG tablet Take 500 mg by mouth every 6 (six) hours as needed.    Marland Kitchen. albuterol (VENTOLIN HFA) 108 (90 Base) MCG/ACT inhaler Inhale 2 puffs into the lungs every 6 (six) hours as needed. Wheezing and shortness of breath    . amLODipine (NORVASC) 10 MG tablet Take 10 mg by mouth  daily.    Marland Kitchen. aspirin EC 81 MG tablet Take 81 mg by mouth daily.    Marland Kitchen. atorvastatin (LIPITOR) 20 MG tablet Take 20 mg by mouth daily.  1  . calcium carbonate (OSCAL) 1500 (600 Ca) MG TABS tablet Take 1 tablet by mouth daily.    . ferrous sulfate 325 (65 FE) MG tablet Take 1 tablet (325 mg total) by mouth daily with breakfast. 30 tablet 3  . gabapentin (NEURONTIN) 800 MG tablet Take 800 mg by mouth 3 (three) times daily.  5  . KLOR-CON 10 10 MEQ tablet Take 20 mEq by mouth daily.  5  . losartan-hydrochlorothiazide (HYZAAR) 100-25 MG tablet losartan 100 mg-hydrochlorothiazide 25 mg tablet  TAKE 1 TABLET BY MOUTH EVERY DAY FOR BLOOD PRESSURE    . mirtazapine (REMERON) 45 MG tablet Take 45 mg by mouth daily.  5  . ondansetron (ZOFRAN ODT) 8 MG disintegrating tablet Take 1 tablet (8 mg total) by mouth every 8 (eight) hours as needed for nausea or vomiting. 20 tablet 0  . Oxycodone HCl 20 MG TABS Take 1 tablet (20 mg total) by mouth every 6 (six) hours as needed (pain).  0  . pantoprazole (PROTONIX) 40 MG tablet Take 40 mg by mouth daily.  11  . PARoxetine (PAXIL) 30 MG tablet Take 30 mg by mouth daily.    . pregabalin (LYRICA) 150 MG capsule Take 150 mg by mouth 2 (two) times daily.    . traZODone (DESYREL) 100 MG tablet Take 100 mg by mouth at bedtime.  0  . metoCLOPramide (REGLAN) 10 MG tablet Take 1 tablet (10 mg total) by mouth 3 (three) times daily with meals. 9 tablet 0   No current facility-administered medications for this visit.     Review of Systems:  GENERAL:  Feels better.  Able to do more..  No fevers, sweats or weight loss. PERFORMANCE STATUS (ECOG):  1 HEENT:  No visual changes, runny nose, sore throat, mouth sores or tenderness. Lungs:  Shortness of breath with exertion.  No cough.  No hemoptysis. Cardiac:  No chest pain, palpitations, orthopnea, or PND. GI:  No nausea, vomiting, diarrhea, constipation, or hematochezia.  Guaiac + stool.  Stool sometimes dark. GU:  No urgency,  frequency, dysuria, or hematuria. Musculoskeletal: Back pain.  No joint pain.  No muscle tenderness. Extremities:  No pain or swelling. Skin:  No rashes or skin changes. Neuro:  Memory poor.  No headache, numbness or weakness, balance or coordination issues. Endocrine:  No diabetes, thyroid issues, hot flashes or night sweats. Psych:  No mood changes, depression or anxiety. Pain:  Back pain. Review of systems:  All other systems reviewed and found to be negative.  Physical Exam: Blood pressure  96/65, pulse 76, weight 168 lb 3.4 oz (76.3 kg). GENERAL:  Well developed, well nourished, woman sitting comfortably in the exam room in no acute distress. MENTAL STATUS:  Alert and oriented to person, place and time. HEAD: Styled brown hair.  Normocephalic, atraumatic, face symmetric, no Cushingoid features. EYES:  Blue eyes.  Pupils equal round and reactive to light and accomodation.  No conjunctivitis or scleral icterus. ENT:  Oropharynx clear without lesion.  Tongue normal. Mucous membranes moist.  RESPIRATORY:  Clear to auscultation without rales, wheezes or rhonchi. CARDIOVASCULAR:  Regular rate and rhythm without murmur, rub or gallop. ABDOMEN:  Soft, non-tender, with active bowel sounds, and no hepatosplenomegaly.  No masses. SKIN:  No rashes, ulcers or lesions. EXTREMITIES: No edema, no skin discoloration or tenderness.  No palpable cords. LYMPH NODES: No palpable cervical, supraclavicular, axillary or inguinal adenopathy  NEUROLOGICAL: Unremarkable. PSYCH:  Appropriate.   Appointment on 05/04/2017  Component Date Value Ref Range Status  . WBC 05/04/2017 5.1  3.6 - 11.0 K/uL Final  . RBC 05/04/2017 4.21  3.80 - 5.20 MIL/uL Final  . Hemoglobin 05/04/2017 12.5  12.0 - 16.0 g/dL Final  . HCT 16/06/9603 38.3  35.0 - 47.0 % Final  . MCV 05/04/2017 91.1  80.0 - 100.0 fL Final  . MCH 05/04/2017 29.8  26.0 - 34.0 pg Final  . MCHC 05/04/2017 32.7  32.0 - 36.0 g/dL Final  . RDW 54/05/8118  18.7* 11.5 - 14.5 % Final  . Platelets 05/04/2017 250  150 - 440 K/uL Final  . Neutrophils Relative % 05/04/2017 68  % Final  . Neutro Abs 05/04/2017 3.4  1.4 - 6.5 K/uL Final  . Lymphocytes Relative 05/04/2017 22  % Final  . Lymphs Abs 05/04/2017 1.1  1.0 - 3.6 K/uL Final  . Monocytes Relative 05/04/2017 7  % Final  . Monocytes Absolute 05/04/2017 0.4  0.2 - 0.9 K/uL Final  . Eosinophils Relative 05/04/2017 3  % Final  . Eosinophils Absolute 05/04/2017 0.2  0 - 0.7 K/uL Final  . Basophils Relative 05/04/2017 0  % Final  . Basophils Absolute 05/04/2017 0.0  0 - 0.1 K/uL Final    Assessment:  AISLEE LANDGREN is a 71 y.o. female with progressive iron deficiency anemia despite oral iron for the past 6 months.  Diet appears good.  She denies any hematochezia, melena, hematuria or vaginal bleeding.  Urinalysis on 04/13/2017 revealed no hematuria.  She has had guaiac + stool.  Last colonoscopy was 8 years ago.  She has had ice pica x 3 weeks.   EGD and colonoscopy during the interim revealed a hiatal hernia and gastritis (no report available). Her GI medications have not changed.  Her prep for the colonoscopy was inadequate.  She does not wish to have the colonoscopy repeated.   CBC on 02/18/2017 revealed a hematocrit of 25.0, hemoglobin 7.8, MCV 79, platelets 405,000, and WBC 5700 with an ANC of 3900.  Ferritin was 11.  She received Venofer weekly 3 (02/23/2017 - 03/15/2017).  Symptomatically, she feels better.  Exam is unremarkable.  Hematocrit is 38.3 and hemoglobin 12.5 today.  Plan: 1. Labs today: CBC with diff, ferritin.  2. Discuss need to follow-up with GI for repeat colonoscopy to investigate source of bleeding. She is resistant despite education. Patient already scheduled to see GI in 09/2016. Family member encouraged patient to consider having the procedure done. She is going to try to encourage the patient to be seen before January in efforts to  prevent further anemia and need for  iron supplementation.  3. RTC in 3 months for labs (CBC with diff, ferritin) 4. RTC in 6 months for MD assessment, labs (CBC with diff, ferritin)    Carlis Stable, NP 05/04/2017, 2:47 PM   I saw and evaluated the patient, participating in the key portions of the service and reviewing pertinent diagnostic studies and records.  I reviewed the nurse practitioner's note and agree with the findings and the plan.  The assessment and plan were discussed with the patient.  Multiple questions were asked by the patient and answered.    Rosey Bath, MD 05/04/2017, 2:54 PM

## 2017-05-04 ENCOUNTER — Ambulatory Visit: Payer: PPO | Admitting: Hematology and Oncology

## 2017-05-04 ENCOUNTER — Inpatient Hospital Stay: Payer: PPO | Attending: Hematology and Oncology | Admitting: Hematology and Oncology

## 2017-05-04 ENCOUNTER — Inpatient Hospital Stay: Payer: PPO

## 2017-05-04 ENCOUNTER — Encounter: Payer: Self-pay | Admitting: Hematology and Oncology

## 2017-05-04 VITALS — BP 96/65 | HR 76 | Wt 168.2 lb

## 2017-05-04 DIAGNOSIS — M1990 Unspecified osteoarthritis, unspecified site: Secondary | ICD-10-CM | POA: Insufficient documentation

## 2017-05-04 DIAGNOSIS — E7800 Pure hypercholesterolemia, unspecified: Secondary | ICD-10-CM | POA: Diagnosis not present

## 2017-05-04 DIAGNOSIS — Z7982 Long term (current) use of aspirin: Secondary | ICD-10-CM | POA: Insufficient documentation

## 2017-05-04 DIAGNOSIS — K589 Irritable bowel syndrome without diarrhea: Secondary | ICD-10-CM | POA: Insufficient documentation

## 2017-05-04 DIAGNOSIS — K219 Gastro-esophageal reflux disease without esophagitis: Secondary | ICD-10-CM | POA: Insufficient documentation

## 2017-05-04 DIAGNOSIS — M797 Fibromyalgia: Secondary | ICD-10-CM | POA: Diagnosis not present

## 2017-05-04 DIAGNOSIS — G5600 Carpal tunnel syndrome, unspecified upper limb: Secondary | ICD-10-CM | POA: Insufficient documentation

## 2017-05-04 DIAGNOSIS — I10 Essential (primary) hypertension: Secondary | ICD-10-CM | POA: Diagnosis not present

## 2017-05-04 DIAGNOSIS — D509 Iron deficiency anemia, unspecified: Secondary | ICD-10-CM | POA: Diagnosis not present

## 2017-05-04 DIAGNOSIS — K449 Diaphragmatic hernia without obstruction or gangrene: Secondary | ICD-10-CM | POA: Insufficient documentation

## 2017-05-04 DIAGNOSIS — Z79899 Other long term (current) drug therapy: Secondary | ICD-10-CM | POA: Insufficient documentation

## 2017-05-04 DIAGNOSIS — F329 Major depressive disorder, single episode, unspecified: Secondary | ICD-10-CM | POA: Insufficient documentation

## 2017-05-04 LAB — CBC WITH DIFFERENTIAL/PLATELET
Basophils Absolute: 0 10*3/uL (ref 0–0.1)
Basophils Relative: 0 %
Eosinophils Absolute: 0.2 10*3/uL (ref 0–0.7)
Eosinophils Relative: 3 %
HCT: 38.3 % (ref 35.0–47.0)
Hemoglobin: 12.5 g/dL (ref 12.0–16.0)
Lymphocytes Relative: 22 %
Lymphs Abs: 1.1 10*3/uL (ref 1.0–3.6)
MCH: 29.8 pg (ref 26.0–34.0)
MCHC: 32.7 g/dL (ref 32.0–36.0)
MCV: 91.1 fL (ref 80.0–100.0)
Monocytes Absolute: 0.4 10*3/uL (ref 0.2–0.9)
Monocytes Relative: 7 %
Neutro Abs: 3.4 10*3/uL (ref 1.4–6.5)
Neutrophils Relative %: 68 %
Platelets: 250 10*3/uL (ref 150–440)
RBC: 4.21 MIL/uL (ref 3.80–5.20)
RDW: 18.7 % — ABNORMAL HIGH (ref 11.5–14.5)
WBC: 5.1 10*3/uL (ref 3.6–11.0)

## 2017-05-04 LAB — FERRITIN: Ferritin: 81 ng/mL (ref 11–307)

## 2017-05-04 NOTE — Progress Notes (Signed)
Patient offers no complaints today.  BP decreased today - Sitting 96/65 HR 76  Standing 89/62 HR 78   Patient states she took pain medication this morning.  Asymptomatic.

## 2017-05-12 DIAGNOSIS — M545 Low back pain: Secondary | ICD-10-CM | POA: Diagnosis not present

## 2017-05-12 DIAGNOSIS — R413 Other amnesia: Secondary | ICD-10-CM | POA: Diagnosis not present

## 2017-05-12 DIAGNOSIS — G4700 Insomnia, unspecified: Secondary | ICD-10-CM | POA: Diagnosis not present

## 2017-05-12 DIAGNOSIS — N390 Urinary tract infection, site not specified: Secondary | ICD-10-CM | POA: Diagnosis not present

## 2017-05-12 DIAGNOSIS — I10 Essential (primary) hypertension: Secondary | ICD-10-CM | POA: Diagnosis not present

## 2017-05-17 ENCOUNTER — Other Ambulatory Visit: Payer: Self-pay | Admitting: Physician Assistant

## 2017-05-17 DIAGNOSIS — M8580 Other specified disorders of bone density and structure, unspecified site: Secondary | ICD-10-CM

## 2017-05-26 ENCOUNTER — Other Ambulatory Visit: Payer: Self-pay | Admitting: Physician Assistant

## 2017-05-26 DIAGNOSIS — M545 Low back pain: Secondary | ICD-10-CM

## 2017-06-14 ENCOUNTER — Ambulatory Visit
Admission: RE | Admit: 2017-06-14 | Discharge: 2017-06-14 | Disposition: A | Discharge status: Home / Self Care | Payer: PPO | Source: Ambulatory Visit | Attending: Physician Assistant | Admitting: Physician Assistant

## 2017-06-14 DIAGNOSIS — M47816 Spondylosis without myelopathy or radiculopathy, lumbar region: Secondary | ICD-10-CM | POA: Insufficient documentation

## 2017-06-14 DIAGNOSIS — M545 Low back pain: Secondary | ICD-10-CM | POA: Insufficient documentation

## 2017-06-14 DIAGNOSIS — I10 Essential (primary) hypertension: Secondary | ICD-10-CM | POA: Diagnosis not present

## 2017-06-14 DIAGNOSIS — R413 Other amnesia: Secondary | ICD-10-CM | POA: Diagnosis not present

## 2017-06-14 DIAGNOSIS — M48061 Spinal stenosis, lumbar region without neurogenic claudication: Secondary | ICD-10-CM | POA: Diagnosis not present

## 2017-06-14 DIAGNOSIS — Z981 Arthrodesis status: Secondary | ICD-10-CM | POA: Diagnosis not present

## 2017-06-14 DIAGNOSIS — M797 Fibromyalgia: Secondary | ICD-10-CM | POA: Diagnosis not present

## 2017-06-14 DIAGNOSIS — Z79899 Other long term (current) drug therapy: Secondary | ICD-10-CM | POA: Diagnosis not present

## 2017-07-04 ENCOUNTER — Other Ambulatory Visit: Payer: PPO

## 2017-08-03 ENCOUNTER — Other Ambulatory Visit: Payer: PPO

## 2017-08-16 DIAGNOSIS — R413 Other amnesia: Secondary | ICD-10-CM | POA: Diagnosis not present

## 2017-08-16 DIAGNOSIS — M48061 Spinal stenosis, lumbar region without neurogenic claudication: Secondary | ICD-10-CM | POA: Diagnosis not present

## 2017-08-16 DIAGNOSIS — I10 Essential (primary) hypertension: Secondary | ICD-10-CM | POA: Diagnosis not present

## 2017-08-16 DIAGNOSIS — M545 Low back pain: Secondary | ICD-10-CM | POA: Diagnosis not present

## 2017-08-16 DIAGNOSIS — D509 Iron deficiency anemia, unspecified: Secondary | ICD-10-CM | POA: Diagnosis not present

## 2017-08-16 DIAGNOSIS — Z6837 Body mass index (BMI) 37.0-37.9, adult: Secondary | ICD-10-CM | POA: Diagnosis not present

## 2017-09-07 DIAGNOSIS — Z6836 Body mass index (BMI) 36.0-36.9, adult: Secondary | ICD-10-CM | POA: Diagnosis not present

## 2017-09-07 DIAGNOSIS — G4700 Insomnia, unspecified: Secondary | ICD-10-CM | POA: Diagnosis not present

## 2017-09-07 DIAGNOSIS — N390 Urinary tract infection, site not specified: Secondary | ICD-10-CM | POA: Diagnosis not present

## 2017-09-27 DIAGNOSIS — G4700 Insomnia, unspecified: Secondary | ICD-10-CM | POA: Diagnosis not present

## 2017-09-27 DIAGNOSIS — F329 Major depressive disorder, single episode, unspecified: Secondary | ICD-10-CM | POA: Diagnosis not present

## 2017-09-27 DIAGNOSIS — M797 Fibromyalgia: Secondary | ICD-10-CM | POA: Diagnosis not present

## 2017-09-27 DIAGNOSIS — I10 Essential (primary) hypertension: Secondary | ICD-10-CM | POA: Diagnosis not present

## 2017-09-27 DIAGNOSIS — Z23 Encounter for immunization: Secondary | ICD-10-CM | POA: Diagnosis not present

## 2017-09-27 DIAGNOSIS — Z1331 Encounter for screening for depression: Secondary | ICD-10-CM | POA: Diagnosis not present

## 2017-09-27 DIAGNOSIS — Z139 Encounter for screening, unspecified: Secondary | ICD-10-CM | POA: Diagnosis not present

## 2017-09-27 DIAGNOSIS — M545 Low back pain: Secondary | ICD-10-CM | POA: Diagnosis not present

## 2017-09-27 DIAGNOSIS — Z6836 Body mass index (BMI) 36.0-36.9, adult: Secondary | ICD-10-CM | POA: Diagnosis not present

## 2017-10-25 DIAGNOSIS — I10 Essential (primary) hypertension: Secondary | ICD-10-CM | POA: Diagnosis not present

## 2017-10-25 DIAGNOSIS — G4700 Insomnia, unspecified: Secondary | ICD-10-CM | POA: Diagnosis not present

## 2017-11-02 ENCOUNTER — Ambulatory Visit: Payer: PPO | Admitting: Hematology and Oncology

## 2017-11-02 ENCOUNTER — Other Ambulatory Visit: Payer: PPO

## 2017-12-12 DIAGNOSIS — I10 Essential (primary) hypertension: Secondary | ICD-10-CM | POA: Diagnosis not present

## 2017-12-12 DIAGNOSIS — D509 Iron deficiency anemia, unspecified: Secondary | ICD-10-CM | POA: Diagnosis not present

## 2017-12-12 DIAGNOSIS — M797 Fibromyalgia: Secondary | ICD-10-CM | POA: Diagnosis not present

## 2017-12-12 DIAGNOSIS — M545 Low back pain: Secondary | ICD-10-CM | POA: Diagnosis not present

## 2017-12-12 DIAGNOSIS — F329 Major depressive disorder, single episode, unspecified: Secondary | ICD-10-CM | POA: Diagnosis not present

## 2017-12-12 DIAGNOSIS — R413 Other amnesia: Secondary | ICD-10-CM | POA: Diagnosis not present

## 2017-12-12 DIAGNOSIS — Z79899 Other long term (current) drug therapy: Secondary | ICD-10-CM | POA: Diagnosis not present

## 2017-12-15 DIAGNOSIS — R269 Unspecified abnormalities of gait and mobility: Secondary | ICD-10-CM | POA: Diagnosis not present

## 2018-03-15 DIAGNOSIS — F418 Other specified anxiety disorders: Secondary | ICD-10-CM | POA: Diagnosis not present

## 2018-03-15 DIAGNOSIS — Z6839 Body mass index (BMI) 39.0-39.9, adult: Secondary | ICD-10-CM | POA: Diagnosis not present

## 2018-03-15 DIAGNOSIS — M545 Low back pain: Secondary | ICD-10-CM | POA: Diagnosis not present

## 2018-03-15 DIAGNOSIS — D509 Iron deficiency anemia, unspecified: Secondary | ICD-10-CM | POA: Diagnosis not present

## 2018-03-15 DIAGNOSIS — I10 Essential (primary) hypertension: Secondary | ICD-10-CM | POA: Diagnosis not present

## 2018-03-15 DIAGNOSIS — M797 Fibromyalgia: Secondary | ICD-10-CM | POA: Diagnosis not present

## 2018-03-15 DIAGNOSIS — R2681 Unsteadiness on feet: Secondary | ICD-10-CM | POA: Diagnosis not present

## 2018-03-15 DIAGNOSIS — G4700 Insomnia, unspecified: Secondary | ICD-10-CM | POA: Diagnosis not present

## 2018-05-11 DIAGNOSIS — Z1339 Encounter for screening examination for other mental health and behavioral disorders: Secondary | ICD-10-CM | POA: Diagnosis not present

## 2018-05-11 DIAGNOSIS — Z0000 Encounter for general adult medical examination without abnormal findings: Secondary | ICD-10-CM | POA: Diagnosis not present

## 2018-05-11 DIAGNOSIS — Z1331 Encounter for screening for depression: Secondary | ICD-10-CM | POA: Diagnosis not present

## 2018-05-11 DIAGNOSIS — Z9181 History of falling: Secondary | ICD-10-CM | POA: Diagnosis not present

## 2018-05-11 DIAGNOSIS — Z139 Encounter for screening, unspecified: Secondary | ICD-10-CM | POA: Diagnosis not present

## 2018-05-11 DIAGNOSIS — E669 Obesity, unspecified: Secondary | ICD-10-CM | POA: Diagnosis not present

## 2018-05-11 DIAGNOSIS — Z6837 Body mass index (BMI) 37.0-37.9, adult: Secondary | ICD-10-CM | POA: Diagnosis not present

## 2018-05-11 DIAGNOSIS — E785 Hyperlipidemia, unspecified: Secondary | ICD-10-CM | POA: Diagnosis not present

## 2018-05-11 DIAGNOSIS — Z136 Encounter for screening for cardiovascular disorders: Secondary | ICD-10-CM | POA: Diagnosis not present

## 2018-06-15 DIAGNOSIS — Z79899 Other long term (current) drug therapy: Secondary | ICD-10-CM | POA: Diagnosis not present

## 2018-06-15 DIAGNOSIS — Z6839 Body mass index (BMI) 39.0-39.9, adult: Secondary | ICD-10-CM | POA: Diagnosis not present

## 2018-06-15 DIAGNOSIS — R350 Frequency of micturition: Secondary | ICD-10-CM | POA: Diagnosis not present

## 2018-06-15 DIAGNOSIS — I10 Essential (primary) hypertension: Secondary | ICD-10-CM | POA: Diagnosis not present

## 2018-06-15 DIAGNOSIS — F418 Other specified anxiety disorders: Secondary | ICD-10-CM | POA: Diagnosis not present

## 2018-06-15 DIAGNOSIS — M545 Low back pain: Secondary | ICD-10-CM | POA: Diagnosis not present

## 2018-06-15 DIAGNOSIS — M797 Fibromyalgia: Secondary | ICD-10-CM | POA: Diagnosis not present

## 2018-06-15 DIAGNOSIS — R2681 Unsteadiness on feet: Secondary | ICD-10-CM | POA: Diagnosis not present

## 2018-06-15 DIAGNOSIS — R413 Other amnesia: Secondary | ICD-10-CM | POA: Diagnosis not present

## 2018-06-15 DIAGNOSIS — G4700 Insomnia, unspecified: Secondary | ICD-10-CM | POA: Diagnosis not present

## 2018-06-27 DIAGNOSIS — I10 Essential (primary) hypertension: Secondary | ICD-10-CM | POA: Diagnosis not present

## 2018-06-27 DIAGNOSIS — G629 Polyneuropathy, unspecified: Secondary | ICD-10-CM | POA: Diagnosis not present

## 2018-06-27 DIAGNOSIS — Z79891 Long term (current) use of opiate analgesic: Secondary | ICD-10-CM | POA: Diagnosis not present

## 2018-06-27 DIAGNOSIS — F329 Major depressive disorder, single episode, unspecified: Secondary | ICD-10-CM | POA: Diagnosis not present

## 2018-06-27 DIAGNOSIS — G8929 Other chronic pain: Secondary | ICD-10-CM | POA: Diagnosis not present

## 2018-06-27 DIAGNOSIS — M797 Fibromyalgia: Secondary | ICD-10-CM | POA: Diagnosis not present

## 2018-06-27 DIAGNOSIS — R413 Other amnesia: Secondary | ICD-10-CM | POA: Diagnosis not present

## 2018-06-27 DIAGNOSIS — F419 Anxiety disorder, unspecified: Secondary | ICD-10-CM | POA: Diagnosis not present

## 2018-06-27 DIAGNOSIS — M48061 Spinal stenosis, lumbar region without neurogenic claudication: Secondary | ICD-10-CM | POA: Diagnosis not present

## 2018-06-27 DIAGNOSIS — Z981 Arthrodesis status: Secondary | ICD-10-CM | POA: Diagnosis not present

## 2018-06-27 DIAGNOSIS — Z9181 History of falling: Secondary | ICD-10-CM | POA: Diagnosis not present

## 2018-12-08 DIAGNOSIS — F418 Other specified anxiety disorders: Secondary | ICD-10-CM | POA: Diagnosis not present

## 2018-12-08 DIAGNOSIS — G4700 Insomnia, unspecified: Secondary | ICD-10-CM | POA: Diagnosis not present

## 2018-12-08 DIAGNOSIS — M799 Soft tissue disorder, unspecified: Secondary | ICD-10-CM | POA: Diagnosis not present

## 2018-12-08 DIAGNOSIS — M7500 Adhesive capsulitis of unspecified shoulder: Secondary | ICD-10-CM | POA: Diagnosis not present

## 2018-12-08 DIAGNOSIS — R413 Other amnesia: Secondary | ICD-10-CM | POA: Diagnosis not present

## 2018-12-08 DIAGNOSIS — Z79899 Other long term (current) drug therapy: Secondary | ICD-10-CM | POA: Diagnosis not present

## 2018-12-08 DIAGNOSIS — Z6837 Body mass index (BMI) 37.0-37.9, adult: Secondary | ICD-10-CM | POA: Diagnosis not present

## 2018-12-08 DIAGNOSIS — M545 Low back pain: Secondary | ICD-10-CM | POA: Diagnosis not present

## 2018-12-08 DIAGNOSIS — I10 Essential (primary) hypertension: Secondary | ICD-10-CM | POA: Diagnosis not present

## 2019-01-29 DIAGNOSIS — N390 Urinary tract infection, site not specified: Secondary | ICD-10-CM | POA: Diagnosis not present

## 2019-03-15 DIAGNOSIS — M797 Fibromyalgia: Secondary | ICD-10-CM | POA: Diagnosis not present

## 2019-03-15 DIAGNOSIS — I10 Essential (primary) hypertension: Secondary | ICD-10-CM | POA: Diagnosis not present

## 2019-03-15 DIAGNOSIS — R413 Other amnesia: Secondary | ICD-10-CM | POA: Diagnosis not present

## 2019-03-15 DIAGNOSIS — Z6838 Body mass index (BMI) 38.0-38.9, adult: Secondary | ICD-10-CM | POA: Diagnosis not present

## 2019-03-15 DIAGNOSIS — M545 Low back pain: Secondary | ICD-10-CM | POA: Diagnosis not present

## 2019-03-15 DIAGNOSIS — F418 Other specified anxiety disorders: Secondary | ICD-10-CM | POA: Diagnosis not present

## 2019-03-15 DIAGNOSIS — G4700 Insomnia, unspecified: Secondary | ICD-10-CM | POA: Diagnosis not present

## 2019-04-24 DIAGNOSIS — N390 Urinary tract infection, site not specified: Secondary | ICD-10-CM | POA: Diagnosis not present

## 2019-07-11 DIAGNOSIS — G4700 Insomnia, unspecified: Secondary | ICD-10-CM | POA: Diagnosis not present

## 2019-07-11 DIAGNOSIS — R413 Other amnesia: Secondary | ICD-10-CM | POA: Diagnosis not present

## 2019-07-11 DIAGNOSIS — Z79899 Other long term (current) drug therapy: Secondary | ICD-10-CM | POA: Diagnosis not present

## 2019-07-11 DIAGNOSIS — F418 Other specified anxiety disorders: Secondary | ICD-10-CM | POA: Diagnosis not present

## 2019-07-11 DIAGNOSIS — M797 Fibromyalgia: Secondary | ICD-10-CM | POA: Diagnosis not present

## 2019-07-11 DIAGNOSIS — Z6839 Body mass index (BMI) 39.0-39.9, adult: Secondary | ICD-10-CM | POA: Diagnosis not present

## 2019-07-11 DIAGNOSIS — Z23 Encounter for immunization: Secondary | ICD-10-CM | POA: Diagnosis not present

## 2019-07-11 DIAGNOSIS — M545 Low back pain: Secondary | ICD-10-CM | POA: Diagnosis not present

## 2019-07-11 DIAGNOSIS — B379 Candidiasis, unspecified: Secondary | ICD-10-CM | POA: Diagnosis not present

## 2019-07-11 DIAGNOSIS — I10 Essential (primary) hypertension: Secondary | ICD-10-CM | POA: Diagnosis not present

## 2019-10-02 DIAGNOSIS — R358 Other polyuria: Secondary | ICD-10-CM | POA: Diagnosis not present

## 2019-10-11 DIAGNOSIS — I10 Essential (primary) hypertension: Secondary | ICD-10-CM | POA: Diagnosis not present

## 2019-10-11 DIAGNOSIS — Z139 Encounter for screening, unspecified: Secondary | ICD-10-CM | POA: Diagnosis not present

## 2019-10-11 DIAGNOSIS — G4700 Insomnia, unspecified: Secondary | ICD-10-CM | POA: Diagnosis not present

## 2019-10-11 DIAGNOSIS — R296 Repeated falls: Secondary | ICD-10-CM | POA: Diagnosis not present

## 2019-10-11 DIAGNOSIS — M545 Low back pain: Secondary | ICD-10-CM | POA: Diagnosis not present

## 2019-10-11 DIAGNOSIS — R413 Other amnesia: Secondary | ICD-10-CM | POA: Diagnosis not present

## 2019-10-11 DIAGNOSIS — F418 Other specified anxiety disorders: Secondary | ICD-10-CM | POA: Diagnosis not present

## 2019-10-11 DIAGNOSIS — N183 Chronic kidney disease, stage 3 unspecified: Secondary | ICD-10-CM | POA: Diagnosis not present

## 2019-10-11 DIAGNOSIS — Z6839 Body mass index (BMI) 39.0-39.9, adult: Secondary | ICD-10-CM | POA: Diagnosis not present

## 2019-10-11 DIAGNOSIS — M797 Fibromyalgia: Secondary | ICD-10-CM | POA: Diagnosis not present

## 2019-10-15 DIAGNOSIS — M797 Fibromyalgia: Secondary | ICD-10-CM | POA: Diagnosis not present

## 2019-10-15 DIAGNOSIS — M48061 Spinal stenosis, lumbar region without neurogenic claudication: Secondary | ICD-10-CM | POA: Diagnosis not present

## 2019-10-15 DIAGNOSIS — G8929 Other chronic pain: Secondary | ICD-10-CM | POA: Diagnosis not present

## 2019-10-15 DIAGNOSIS — E559 Vitamin D deficiency, unspecified: Secondary | ICD-10-CM | POA: Diagnosis not present

## 2019-10-15 DIAGNOSIS — D509 Iron deficiency anemia, unspecified: Secondary | ICD-10-CM | POA: Diagnosis not present

## 2019-10-15 DIAGNOSIS — B372 Candidiasis of skin and nail: Secondary | ICD-10-CM | POA: Diagnosis not present

## 2019-10-15 DIAGNOSIS — Z9181 History of falling: Secondary | ICD-10-CM | POA: Diagnosis not present

## 2019-10-15 DIAGNOSIS — E782 Mixed hyperlipidemia: Secondary | ICD-10-CM | POA: Diagnosis not present

## 2019-10-15 DIAGNOSIS — Z79891 Long term (current) use of opiate analgesic: Secondary | ICD-10-CM | POA: Diagnosis not present

## 2019-10-15 DIAGNOSIS — G4700 Insomnia, unspecified: Secondary | ICD-10-CM | POA: Diagnosis not present

## 2019-10-15 DIAGNOSIS — I10 Essential (primary) hypertension: Secondary | ICD-10-CM | POA: Diagnosis not present

## 2019-10-15 DIAGNOSIS — R296 Repeated falls: Secondary | ICD-10-CM | POA: Diagnosis not present

## 2019-10-15 DIAGNOSIS — F329 Major depressive disorder, single episode, unspecified: Secondary | ICD-10-CM | POA: Diagnosis not present

## 2019-10-15 DIAGNOSIS — Z981 Arthrodesis status: Secondary | ICD-10-CM | POA: Diagnosis not present

## 2019-10-15 DIAGNOSIS — M8580 Other specified disorders of bone density and structure, unspecified site: Secondary | ICD-10-CM | POA: Diagnosis not present

## 2019-10-15 DIAGNOSIS — F419 Anxiety disorder, unspecified: Secondary | ICD-10-CM | POA: Diagnosis not present

## 2019-10-15 DIAGNOSIS — R413 Other amnesia: Secondary | ICD-10-CM | POA: Diagnosis not present

## 2019-10-15 DIAGNOSIS — K5790 Diverticulosis of intestine, part unspecified, without perforation or abscess without bleeding: Secondary | ICD-10-CM | POA: Diagnosis not present

## 2019-10-15 DIAGNOSIS — G629 Polyneuropathy, unspecified: Secondary | ICD-10-CM | POA: Diagnosis not present

## 2020-01-10 DIAGNOSIS — I10 Essential (primary) hypertension: Secondary | ICD-10-CM | POA: Diagnosis not present

## 2020-01-10 DIAGNOSIS — M797 Fibromyalgia: Secondary | ICD-10-CM | POA: Diagnosis not present

## 2020-01-10 DIAGNOSIS — F413 Other mixed anxiety disorders: Secondary | ICD-10-CM | POA: Diagnosis not present

## 2020-01-10 DIAGNOSIS — G4700 Insomnia, unspecified: Secondary | ICD-10-CM | POA: Diagnosis not present

## 2020-01-10 DIAGNOSIS — Z6841 Body Mass Index (BMI) 40.0 and over, adult: Secondary | ICD-10-CM | POA: Diagnosis not present

## 2020-01-10 DIAGNOSIS — G629 Polyneuropathy, unspecified: Secondary | ICD-10-CM | POA: Diagnosis not present

## 2020-01-10 DIAGNOSIS — Z79899 Other long term (current) drug therapy: Secondary | ICD-10-CM | POA: Diagnosis not present

## 2020-01-10 DIAGNOSIS — N183 Chronic kidney disease, stage 3 unspecified: Secondary | ICD-10-CM | POA: Diagnosis not present

## 2020-01-10 DIAGNOSIS — R413 Other amnesia: Secondary | ICD-10-CM | POA: Diagnosis not present

## 2020-01-10 DIAGNOSIS — R296 Repeated falls: Secondary | ICD-10-CM | POA: Diagnosis not present

## 2020-01-10 DIAGNOSIS — M545 Low back pain: Secondary | ICD-10-CM | POA: Diagnosis not present

## 2020-01-14 DIAGNOSIS — E782 Mixed hyperlipidemia: Secondary | ICD-10-CM | POA: Diagnosis not present

## 2020-01-14 DIAGNOSIS — Z981 Arthrodesis status: Secondary | ICD-10-CM | POA: Diagnosis not present

## 2020-01-14 DIAGNOSIS — I129 Hypertensive chronic kidney disease with stage 1 through stage 4 chronic kidney disease, or unspecified chronic kidney disease: Secondary | ICD-10-CM | POA: Diagnosis not present

## 2020-01-14 DIAGNOSIS — Z9682 Presence of neurostimulator: Secondary | ICD-10-CM | POA: Diagnosis not present

## 2020-01-14 DIAGNOSIS — Z791 Long term (current) use of non-steroidal anti-inflammatories (NSAID): Secondary | ICD-10-CM | POA: Diagnosis not present

## 2020-01-14 DIAGNOSIS — D631 Anemia in chronic kidney disease: Secondary | ICD-10-CM | POA: Diagnosis not present

## 2020-01-14 DIAGNOSIS — F1120 Opioid dependence, uncomplicated: Secondary | ICD-10-CM | POA: Diagnosis not present

## 2020-01-14 DIAGNOSIS — G4700 Insomnia, unspecified: Secondary | ICD-10-CM | POA: Diagnosis not present

## 2020-01-14 DIAGNOSIS — F418 Other specified anxiety disorders: Secondary | ICD-10-CM | POA: Diagnosis not present

## 2020-01-14 DIAGNOSIS — M48061 Spinal stenosis, lumbar region without neurogenic claudication: Secondary | ICD-10-CM | POA: Diagnosis not present

## 2020-01-14 DIAGNOSIS — R296 Repeated falls: Secondary | ICD-10-CM | POA: Diagnosis not present

## 2020-01-14 DIAGNOSIS — E559 Vitamin D deficiency, unspecified: Secondary | ICD-10-CM | POA: Diagnosis not present

## 2020-01-14 DIAGNOSIS — D509 Iron deficiency anemia, unspecified: Secondary | ICD-10-CM | POA: Diagnosis not present

## 2020-01-14 DIAGNOSIS — M797 Fibromyalgia: Secondary | ICD-10-CM | POA: Diagnosis not present

## 2020-01-14 DIAGNOSIS — K5790 Diverticulosis of intestine, part unspecified, without perforation or abscess without bleeding: Secondary | ICD-10-CM | POA: Diagnosis not present

## 2020-01-14 DIAGNOSIS — Z7982 Long term (current) use of aspirin: Secondary | ICD-10-CM | POA: Diagnosis not present

## 2020-01-14 DIAGNOSIS — N183 Chronic kidney disease, stage 3 unspecified: Secondary | ICD-10-CM | POA: Diagnosis not present

## 2020-01-14 DIAGNOSIS — M8580 Other specified disorders of bone density and structure, unspecified site: Secondary | ICD-10-CM | POA: Diagnosis not present

## 2020-01-14 DIAGNOSIS — R413 Other amnesia: Secondary | ICD-10-CM | POA: Diagnosis not present

## 2020-01-14 DIAGNOSIS — G8929 Other chronic pain: Secondary | ICD-10-CM | POA: Diagnosis not present

## 2020-01-14 DIAGNOSIS — G629 Polyneuropathy, unspecified: Secondary | ICD-10-CM | POA: Diagnosis not present

## 2020-04-10 DIAGNOSIS — M545 Low back pain: Secondary | ICD-10-CM | POA: Diagnosis not present

## 2020-04-10 DIAGNOSIS — N183 Chronic kidney disease, stage 3 unspecified: Secondary | ICD-10-CM | POA: Diagnosis not present

## 2020-04-10 DIAGNOSIS — G4700 Insomnia, unspecified: Secondary | ICD-10-CM | POA: Diagnosis not present

## 2020-04-10 DIAGNOSIS — Z6841 Body Mass Index (BMI) 40.0 and over, adult: Secondary | ICD-10-CM | POA: Diagnosis not present

## 2020-04-10 DIAGNOSIS — R413 Other amnesia: Secondary | ICD-10-CM | POA: Diagnosis not present

## 2020-04-10 DIAGNOSIS — I10 Essential (primary) hypertension: Secondary | ICD-10-CM | POA: Diagnosis not present

## 2020-04-10 DIAGNOSIS — F418 Other specified anxiety disorders: Secondary | ICD-10-CM | POA: Diagnosis not present

## 2020-04-10 DIAGNOSIS — M797 Fibromyalgia: Secondary | ICD-10-CM | POA: Diagnosis not present

## 2020-07-18 DIAGNOSIS — Z23 Encounter for immunization: Secondary | ICD-10-CM | POA: Diagnosis not present

## 2020-07-18 DIAGNOSIS — Z1331 Encounter for screening for depression: Secondary | ICD-10-CM | POA: Diagnosis not present

## 2020-07-18 DIAGNOSIS — Z79899 Other long term (current) drug therapy: Secondary | ICD-10-CM | POA: Diagnosis not present

## 2020-07-18 DIAGNOSIS — Z6841 Body Mass Index (BMI) 40.0 and over, adult: Secondary | ICD-10-CM | POA: Diagnosis not present

## 2020-07-18 DIAGNOSIS — M545 Low back pain, unspecified: Secondary | ICD-10-CM | POA: Diagnosis not present

## 2020-07-18 DIAGNOSIS — Z9181 History of falling: Secondary | ICD-10-CM | POA: Diagnosis not present

## 2020-07-18 DIAGNOSIS — F1120 Opioid dependence, uncomplicated: Secondary | ICD-10-CM | POA: Diagnosis not present

## 2020-07-18 DIAGNOSIS — G629 Polyneuropathy, unspecified: Secondary | ICD-10-CM | POA: Diagnosis not present

## 2020-08-18 DIAGNOSIS — E785 Hyperlipidemia, unspecified: Secondary | ICD-10-CM | POA: Diagnosis not present

## 2020-08-18 DIAGNOSIS — Z139 Encounter for screening, unspecified: Secondary | ICD-10-CM | POA: Diagnosis not present

## 2020-08-18 DIAGNOSIS — Z9181 History of falling: Secondary | ICD-10-CM | POA: Diagnosis not present

## 2020-08-18 DIAGNOSIS — Z0000 Encounter for general adult medical examination without abnormal findings: Secondary | ICD-10-CM | POA: Diagnosis not present

## 2020-08-18 DIAGNOSIS — Z1331 Encounter for screening for depression: Secondary | ICD-10-CM | POA: Diagnosis not present

## 2020-08-18 DIAGNOSIS — E669 Obesity, unspecified: Secondary | ICD-10-CM | POA: Diagnosis not present

## 2020-09-19 DIAGNOSIS — N390 Urinary tract infection, site not specified: Secondary | ICD-10-CM | POA: Diagnosis not present

## 2020-11-10 DIAGNOSIS — I10 Essential (primary) hypertension: Secondary | ICD-10-CM | POA: Diagnosis not present

## 2020-11-10 DIAGNOSIS — F1120 Opioid dependence, uncomplicated: Secondary | ICD-10-CM | POA: Diagnosis not present

## 2020-11-10 DIAGNOSIS — M545 Low back pain, unspecified: Secondary | ICD-10-CM | POA: Diagnosis not present

## 2020-11-10 DIAGNOSIS — Z6841 Body Mass Index (BMI) 40.0 and over, adult: Secondary | ICD-10-CM | POA: Diagnosis not present

## 2020-11-10 DIAGNOSIS — Z79899 Other long term (current) drug therapy: Secondary | ICD-10-CM | POA: Diagnosis not present

## 2020-12-11 DIAGNOSIS — E782 Mixed hyperlipidemia: Secondary | ICD-10-CM | POA: Diagnosis not present

## 2020-12-11 DIAGNOSIS — M545 Low back pain, unspecified: Secondary | ICD-10-CM | POA: Diagnosis not present

## 2020-12-11 DIAGNOSIS — Z79899 Other long term (current) drug therapy: Secondary | ICD-10-CM | POA: Diagnosis not present

## 2020-12-11 DIAGNOSIS — R000 Tachycardia, unspecified: Secondary | ICD-10-CM | POA: Diagnosis not present

## 2020-12-11 DIAGNOSIS — I10 Essential (primary) hypertension: Secondary | ICD-10-CM | POA: Diagnosis not present

## 2020-12-11 DIAGNOSIS — Z6841 Body Mass Index (BMI) 40.0 and over, adult: Secondary | ICD-10-CM | POA: Diagnosis not present

## 2021-03-16 DIAGNOSIS — E782 Mixed hyperlipidemia: Secondary | ICD-10-CM | POA: Diagnosis not present

## 2021-03-16 DIAGNOSIS — Z6841 Body Mass Index (BMI) 40.0 and over, adult: Secondary | ICD-10-CM | POA: Diagnosis not present

## 2021-03-16 DIAGNOSIS — F418 Other specified anxiety disorders: Secondary | ICD-10-CM | POA: Diagnosis not present

## 2021-03-16 DIAGNOSIS — M545 Low back pain, unspecified: Secondary | ICD-10-CM | POA: Diagnosis not present

## 2021-03-16 DIAGNOSIS — Z79899 Other long term (current) drug therapy: Secondary | ICD-10-CM | POA: Diagnosis not present

## 2021-03-16 DIAGNOSIS — I10 Essential (primary) hypertension: Secondary | ICD-10-CM | POA: Diagnosis not present

## 2021-08-20 DIAGNOSIS — E785 Hyperlipidemia, unspecified: Secondary | ICD-10-CM | POA: Diagnosis not present

## 2021-08-20 DIAGNOSIS — Z6841 Body Mass Index (BMI) 40.0 and over, adult: Secondary | ICD-10-CM | POA: Diagnosis not present

## 2021-08-20 DIAGNOSIS — E669 Obesity, unspecified: Secondary | ICD-10-CM | POA: Diagnosis not present

## 2021-08-20 DIAGNOSIS — Z1331 Encounter for screening for depression: Secondary | ICD-10-CM | POA: Diagnosis not present

## 2021-08-20 DIAGNOSIS — Z0000 Encounter for general adult medical examination without abnormal findings: Secondary | ICD-10-CM | POA: Diagnosis not present

## 2021-08-20 DIAGNOSIS — Z9181 History of falling: Secondary | ICD-10-CM | POA: Diagnosis not present

## 2021-08-24 DIAGNOSIS — Z9989 Dependence on other enabling machines and devices: Secondary | ICD-10-CM | POA: Diagnosis not present

## 2021-08-24 DIAGNOSIS — Z9889 Other specified postprocedural states: Secondary | ICD-10-CM | POA: Diagnosis not present

## 2021-08-24 DIAGNOSIS — M549 Dorsalgia, unspecified: Secondary | ICD-10-CM | POA: Diagnosis not present

## 2021-08-24 DIAGNOSIS — I10 Essential (primary) hypertension: Secondary | ICD-10-CM | POA: Diagnosis not present

## 2021-08-24 DIAGNOSIS — G8929 Other chronic pain: Secondary | ICD-10-CM | POA: Diagnosis not present

## 2021-09-18 DIAGNOSIS — F0390 Unspecified dementia without behavioral disturbance: Secondary | ICD-10-CM | POA: Diagnosis not present

## 2021-09-18 DIAGNOSIS — Z23 Encounter for immunization: Secondary | ICD-10-CM | POA: Diagnosis not present

## 2021-09-18 DIAGNOSIS — R42 Dizziness and giddiness: Secondary | ICD-10-CM | POA: Diagnosis not present

## 2021-09-18 DIAGNOSIS — R739 Hyperglycemia, unspecified: Secondary | ICD-10-CM | POA: Diagnosis not present

## 2021-09-18 DIAGNOSIS — M545 Low back pain, unspecified: Secondary | ICD-10-CM | POA: Diagnosis not present

## 2021-09-18 DIAGNOSIS — I10 Essential (primary) hypertension: Secondary | ICD-10-CM | POA: Diagnosis not present

## 2021-09-18 DIAGNOSIS — E782 Mixed hyperlipidemia: Secondary | ICD-10-CM | POA: Diagnosis not present

## 2021-11-24 DIAGNOSIS — F1120 Opioid dependence, uncomplicated: Secondary | ICD-10-CM | POA: Diagnosis not present

## 2021-11-24 DIAGNOSIS — N183 Chronic kidney disease, stage 3 unspecified: Secondary | ICD-10-CM | POA: Diagnosis not present

## 2021-11-24 DIAGNOSIS — E669 Obesity, unspecified: Secondary | ICD-10-CM | POA: Diagnosis not present

## 2021-11-24 DIAGNOSIS — R32 Unspecified urinary incontinence: Secondary | ICD-10-CM | POA: Diagnosis not present

## 2021-11-24 DIAGNOSIS — G63 Polyneuropathy in diseases classified elsewhere: Secondary | ICD-10-CM | POA: Diagnosis not present

## 2021-11-24 DIAGNOSIS — I10 Essential (primary) hypertension: Secondary | ICD-10-CM | POA: Diagnosis not present

## 2021-11-24 DIAGNOSIS — R269 Unspecified abnormalities of gait and mobility: Secondary | ICD-10-CM | POA: Diagnosis not present

## 2021-11-24 DIAGNOSIS — E785 Hyperlipidemia, unspecified: Secondary | ICD-10-CM | POA: Diagnosis not present

## 2021-11-24 DIAGNOSIS — I129 Hypertensive chronic kidney disease with stage 1 through stage 4 chronic kidney disease, or unspecified chronic kidney disease: Secondary | ICD-10-CM | POA: Diagnosis not present

## 2021-11-30 DIAGNOSIS — Z139 Encounter for screening, unspecified: Secondary | ICD-10-CM | POA: Diagnosis not present

## 2021-11-30 DIAGNOSIS — Z6836 Body mass index (BMI) 36.0-36.9, adult: Secondary | ICD-10-CM | POA: Diagnosis not present

## 2021-11-30 DIAGNOSIS — M545 Low back pain, unspecified: Secondary | ICD-10-CM | POA: Diagnosis not present

## 2021-11-30 DIAGNOSIS — N3000 Acute cystitis without hematuria: Secondary | ICD-10-CM | POA: Diagnosis not present

## 2022-01-13 DIAGNOSIS — M549 Dorsalgia, unspecified: Secondary | ICD-10-CM | POA: Diagnosis not present

## 2022-01-13 DIAGNOSIS — G629 Polyneuropathy, unspecified: Secondary | ICD-10-CM | POA: Diagnosis not present

## 2022-01-13 DIAGNOSIS — Z9889 Other specified postprocedural states: Secondary | ICD-10-CM | POA: Diagnosis not present

## 2022-01-13 DIAGNOSIS — I10 Essential (primary) hypertension: Secondary | ICD-10-CM | POA: Diagnosis not present

## 2022-01-13 DIAGNOSIS — Z9989 Dependence on other enabling machines and devices: Secondary | ICD-10-CM | POA: Diagnosis not present

## 2022-01-13 DIAGNOSIS — G8929 Other chronic pain: Secondary | ICD-10-CM | POA: Diagnosis not present

## 2022-01-13 DIAGNOSIS — R413 Other amnesia: Secondary | ICD-10-CM | POA: Diagnosis not present

## 2022-03-25 DIAGNOSIS — Z6836 Body mass index (BMI) 36.0-36.9, adult: Secondary | ICD-10-CM | POA: Diagnosis not present

## 2022-03-25 DIAGNOSIS — I10 Essential (primary) hypertension: Secondary | ICD-10-CM | POA: Diagnosis not present

## 2022-03-25 DIAGNOSIS — L853 Xerosis cutis: Secondary | ICD-10-CM | POA: Diagnosis not present

## 2022-03-25 DIAGNOSIS — F418 Other specified anxiety disorders: Secondary | ICD-10-CM | POA: Diagnosis not present

## 2022-03-25 DIAGNOSIS — N182 Chronic kidney disease, stage 2 (mild): Secondary | ICD-10-CM | POA: Diagnosis not present

## 2022-03-25 DIAGNOSIS — F0390 Unspecified dementia without behavioral disturbance: Secondary | ICD-10-CM | POA: Diagnosis not present

## 2022-10-12 DIAGNOSIS — E782 Mixed hyperlipidemia: Secondary | ICD-10-CM | POA: Diagnosis not present

## 2022-10-12 DIAGNOSIS — I10 Essential (primary) hypertension: Secondary | ICD-10-CM | POA: Diagnosis not present

## 2022-10-12 DIAGNOSIS — N182 Chronic kidney disease, stage 2 (mild): Secondary | ICD-10-CM | POA: Diagnosis not present

## 2022-10-12 DIAGNOSIS — R296 Repeated falls: Secondary | ICD-10-CM | POA: Diagnosis not present

## 2022-10-12 DIAGNOSIS — R2689 Other abnormalities of gait and mobility: Secondary | ICD-10-CM | POA: Diagnosis not present

## 2022-10-12 DIAGNOSIS — R2681 Unsteadiness on feet: Secondary | ICD-10-CM | POA: Diagnosis not present

## 2022-10-12 DIAGNOSIS — F0390 Unspecified dementia without behavioral disturbance: Secondary | ICD-10-CM | POA: Diagnosis not present

## 2022-10-12 DIAGNOSIS — Z79899 Other long term (current) drug therapy: Secondary | ICD-10-CM | POA: Diagnosis not present

## 2022-10-12 DIAGNOSIS — M545 Low back pain, unspecified: Secondary | ICD-10-CM | POA: Diagnosis not present

## 2022-10-12 DIAGNOSIS — F418 Other specified anxiety disorders: Secondary | ICD-10-CM | POA: Diagnosis not present

## 2022-10-12 DIAGNOSIS — K219 Gastro-esophageal reflux disease without esophagitis: Secondary | ICD-10-CM | POA: Diagnosis not present

## 2023-04-15 DIAGNOSIS — Z139 Encounter for screening, unspecified: Secondary | ICD-10-CM | POA: Diagnosis not present

## 2023-04-15 DIAGNOSIS — K219 Gastro-esophageal reflux disease without esophagitis: Secondary | ICD-10-CM | POA: Diagnosis not present

## 2023-04-15 DIAGNOSIS — I10 Essential (primary) hypertension: Secondary | ICD-10-CM | POA: Diagnosis not present

## 2023-04-15 DIAGNOSIS — E538 Deficiency of other specified B group vitamins: Secondary | ICD-10-CM | POA: Diagnosis not present

## 2023-04-15 DIAGNOSIS — E782 Mixed hyperlipidemia: Secondary | ICD-10-CM | POA: Diagnosis not present

## 2023-04-15 DIAGNOSIS — M545 Low back pain, unspecified: Secondary | ICD-10-CM | POA: Diagnosis not present

## 2023-04-15 DIAGNOSIS — F418 Other specified anxiety disorders: Secondary | ICD-10-CM | POA: Diagnosis not present

## 2023-04-15 DIAGNOSIS — N182 Chronic kidney disease, stage 2 (mild): Secondary | ICD-10-CM | POA: Diagnosis not present

## 2023-04-15 DIAGNOSIS — F0390 Unspecified dementia without behavioral disturbance: Secondary | ICD-10-CM | POA: Diagnosis not present

## 2023-04-21 DIAGNOSIS — N390 Urinary tract infection, site not specified: Secondary | ICD-10-CM | POA: Diagnosis not present

## 2023-10-13 DIAGNOSIS — E782 Mixed hyperlipidemia: Secondary | ICD-10-CM | POA: Diagnosis not present

## 2023-10-13 DIAGNOSIS — I10 Essential (primary) hypertension: Secondary | ICD-10-CM | POA: Diagnosis not present

## 2023-10-13 DIAGNOSIS — Z79899 Other long term (current) drug therapy: Secondary | ICD-10-CM | POA: Diagnosis not present

## 2023-10-13 DIAGNOSIS — Z23 Encounter for immunization: Secondary | ICD-10-CM | POA: Diagnosis not present

## 2023-10-13 DIAGNOSIS — Z9181 History of falling: Secondary | ICD-10-CM | POA: Diagnosis not present

## 2023-10-13 DIAGNOSIS — F418 Other specified anxiety disorders: Secondary | ICD-10-CM | POA: Diagnosis not present

## 2023-10-13 DIAGNOSIS — M545 Low back pain, unspecified: Secondary | ICD-10-CM | POA: Diagnosis not present

## 2023-10-13 DIAGNOSIS — R300 Dysuria: Secondary | ICD-10-CM | POA: Diagnosis not present

## 2023-10-13 DIAGNOSIS — N1831 Chronic kidney disease, stage 3a: Secondary | ICD-10-CM | POA: Diagnosis not present

## 2023-10-13 DIAGNOSIS — E538 Deficiency of other specified B group vitamins: Secondary | ICD-10-CM | POA: Diagnosis not present

## 2023-10-13 DIAGNOSIS — K219 Gastro-esophageal reflux disease without esophagitis: Secondary | ICD-10-CM | POA: Diagnosis not present

## 2023-10-13 DIAGNOSIS — F0390 Unspecified dementia without behavioral disturbance: Secondary | ICD-10-CM | POA: Diagnosis not present

## 2023-10-13 DIAGNOSIS — Z139 Encounter for screening, unspecified: Secondary | ICD-10-CM | POA: Diagnosis not present

## 2023-10-20 DIAGNOSIS — Z0000 Encounter for general adult medical examination without abnormal findings: Secondary | ICD-10-CM | POA: Diagnosis not present

## 2023-10-20 DIAGNOSIS — Z139 Encounter for screening, unspecified: Secondary | ICD-10-CM | POA: Diagnosis not present

## 2023-10-20 DIAGNOSIS — Z9181 History of falling: Secondary | ICD-10-CM | POA: Diagnosis not present

## 2023-10-20 DIAGNOSIS — Z1331 Encounter for screening for depression: Secondary | ICD-10-CM | POA: Diagnosis not present

## 2024-04-12 DIAGNOSIS — E782 Mixed hyperlipidemia: Secondary | ICD-10-CM | POA: Diagnosis not present

## 2024-04-12 DIAGNOSIS — I10 Essential (primary) hypertension: Secondary | ICD-10-CM | POA: Diagnosis not present

## 2024-04-12 DIAGNOSIS — K219 Gastro-esophageal reflux disease without esophagitis: Secondary | ICD-10-CM | POA: Diagnosis not present

## 2024-04-12 DIAGNOSIS — E538 Deficiency of other specified B group vitamins: Secondary | ICD-10-CM | POA: Diagnosis not present

## 2024-04-12 DIAGNOSIS — M545 Low back pain, unspecified: Secondary | ICD-10-CM | POA: Diagnosis not present

## 2024-04-12 DIAGNOSIS — F418 Other specified anxiety disorders: Secondary | ICD-10-CM | POA: Diagnosis not present

## 2024-04-12 DIAGNOSIS — F0390 Unspecified dementia without behavioral disturbance: Secondary | ICD-10-CM | POA: Diagnosis not present

## 2024-04-12 DIAGNOSIS — B379 Candidiasis, unspecified: Secondary | ICD-10-CM | POA: Diagnosis not present

## 2024-04-12 DIAGNOSIS — N1831 Chronic kidney disease, stage 3a: Secondary | ICD-10-CM | POA: Diagnosis not present
